# Patient Record
Sex: Female | Born: 1945 | Race: White | Hispanic: No | Smoking: Former smoker
Health system: Southern US, Community
[De-identification: ages and names within clinical notes are randomized; demographics above are authoritative.]

## PROBLEM LIST (undated history)

## (undated) DIAGNOSIS — E559 Vitamin D deficiency, unspecified: Secondary | ICD-10-CM

## (undated) HISTORY — PX: APPENDECTOMY: SHX54

## (undated) HISTORY — DX: Vitamin D deficiency, unspecified: E55.9

## (undated) HISTORY — PX: BACK SURGERY: SHX140

## (undated) HISTORY — PX: SALPINGOOPHORECTOMY: SHX82

---

## 1979-01-07 HISTORY — PX: ABDOMINAL HYSTERECTOMY: SHX81

## 1997-05-16 ENCOUNTER — Ambulatory Visit (HOSPITAL_COMMUNITY): Admission: RE | Admit: 1997-05-16 | Discharge: 1997-05-16 | Payer: Self-pay | Admitting: Internal Medicine

## 1999-12-13 ENCOUNTER — Encounter: Payer: Self-pay | Admitting: Internal Medicine

## 1999-12-13 ENCOUNTER — Encounter: Admission: RE | Admit: 1999-12-13 | Discharge: 1999-12-13 | Payer: Self-pay | Admitting: Internal Medicine

## 2002-08-28 ENCOUNTER — Encounter: Payer: Self-pay | Admitting: Emergency Medicine

## 2002-08-28 ENCOUNTER — Emergency Department (HOSPITAL_COMMUNITY): Admission: EM | Admit: 2002-08-28 | Discharge: 2002-08-29 | Payer: Self-pay | Admitting: Emergency Medicine

## 2004-08-16 ENCOUNTER — Ambulatory Visit (HOSPITAL_COMMUNITY): Admission: RE | Admit: 2004-08-16 | Discharge: 2004-08-16 | Payer: Self-pay | Admitting: Internal Medicine

## 2005-01-31 ENCOUNTER — Encounter: Admission: RE | Admit: 2005-01-31 | Discharge: 2005-01-31 | Payer: Self-pay | Admitting: Internal Medicine

## 2006-02-16 ENCOUNTER — Ambulatory Visit (HOSPITAL_COMMUNITY): Admission: RE | Admit: 2006-02-16 | Discharge: 2006-02-16 | Payer: Self-pay | Admitting: Internal Medicine

## 2006-02-26 ENCOUNTER — Ambulatory Visit: Payer: Self-pay

## 2006-09-24 ENCOUNTER — Encounter: Admission: RE | Admit: 2006-09-24 | Discharge: 2006-09-24 | Payer: Self-pay | Admitting: Internal Medicine

## 2007-11-02 ENCOUNTER — Encounter: Admission: RE | Admit: 2007-11-02 | Discharge: 2007-11-02 | Payer: Self-pay | Admitting: Orthopedic Surgery

## 2007-11-30 ENCOUNTER — Encounter: Admission: RE | Admit: 2007-11-30 | Discharge: 2007-11-30 | Payer: Self-pay | Admitting: Neurosurgery

## 2008-01-19 ENCOUNTER — Inpatient Hospital Stay (HOSPITAL_COMMUNITY): Admission: RE | Admit: 2008-01-19 | Discharge: 2008-01-25 | Payer: Self-pay | Admitting: Neurosurgery

## 2008-02-06 ENCOUNTER — Emergency Department (HOSPITAL_COMMUNITY): Admission: EM | Admit: 2008-02-06 | Discharge: 2008-02-06 | Payer: Self-pay | Admitting: Emergency Medicine

## 2008-06-22 ENCOUNTER — Ambulatory Visit: Payer: Self-pay | Admitting: Internal Medicine

## 2008-06-23 ENCOUNTER — Telehealth: Payer: Self-pay | Admitting: Internal Medicine

## 2008-07-06 ENCOUNTER — Ambulatory Visit: Payer: Self-pay | Admitting: Internal Medicine

## 2008-07-08 ENCOUNTER — Encounter: Payer: Self-pay | Admitting: Internal Medicine

## 2008-09-09 DIAGNOSIS — I10 Essential (primary) hypertension: Secondary | ICD-10-CM

## 2008-12-26 ENCOUNTER — Ambulatory Visit (HOSPITAL_COMMUNITY): Admission: RE | Admit: 2008-12-26 | Discharge: 2008-12-26 | Payer: Self-pay | Admitting: Internal Medicine

## 2009-02-07 ENCOUNTER — Ambulatory Visit (HOSPITAL_COMMUNITY): Admission: RE | Admit: 2009-02-07 | Discharge: 2009-02-07 | Payer: Self-pay | Admitting: Internal Medicine

## 2010-01-27 ENCOUNTER — Encounter: Payer: Self-pay | Admitting: Sports Medicine

## 2010-04-01 IMAGING — CR DG HIP COMPLETE 2+V*R*
3 series · 3 of 3 positions shown · non-contrast
Comparison: None

CLINICAL DATA: Abnormal bone density scan of the right hip.

RIGHT HIP - COMPLETE 2+ VIEW

[view not recorded (1 of 3)]
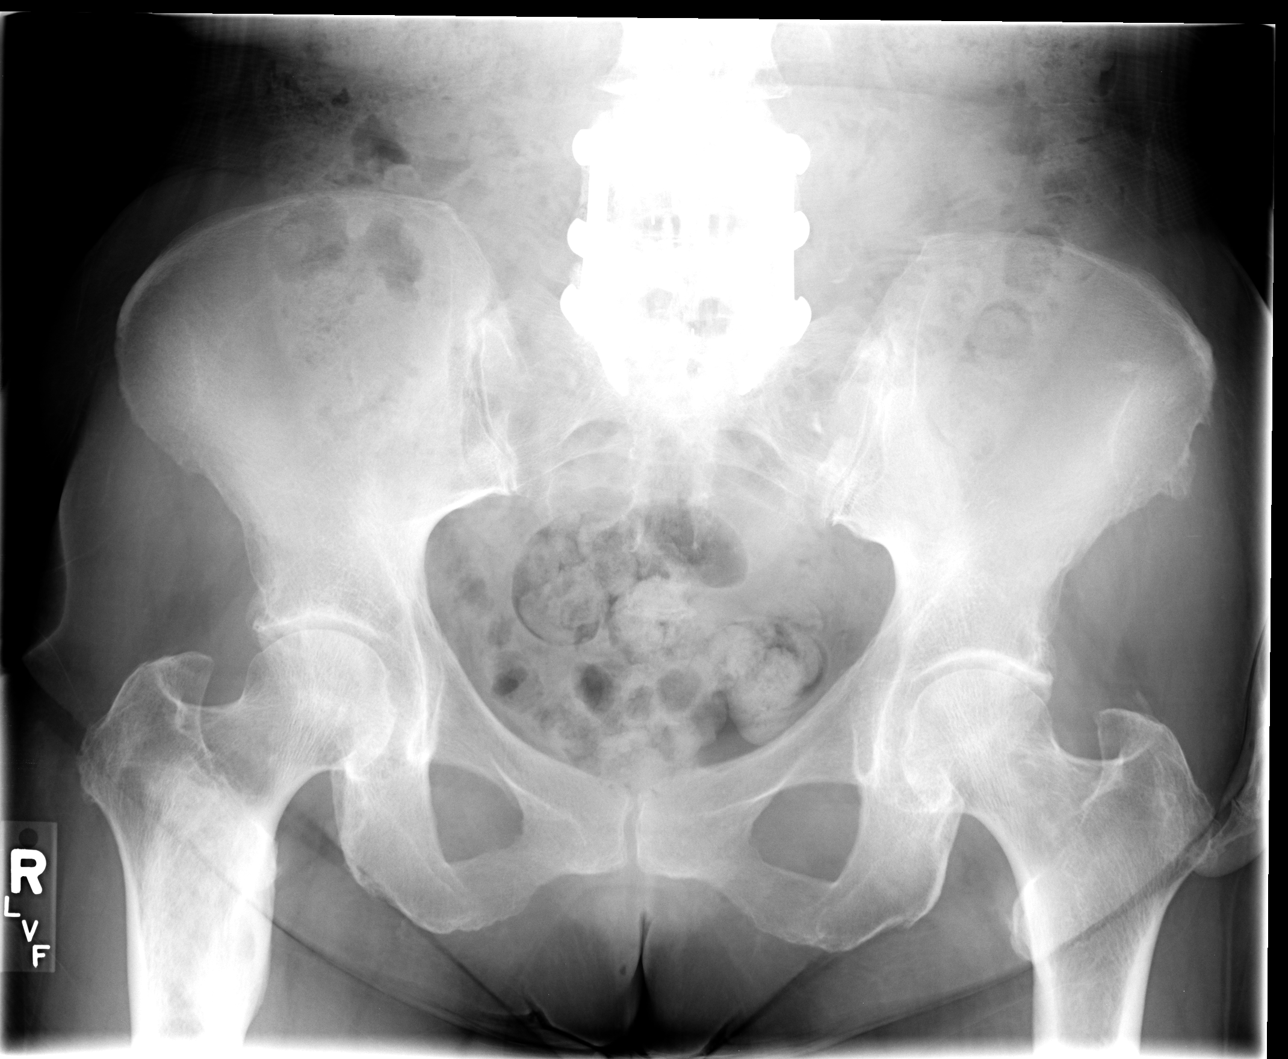

[view not recorded (2 of 3)]
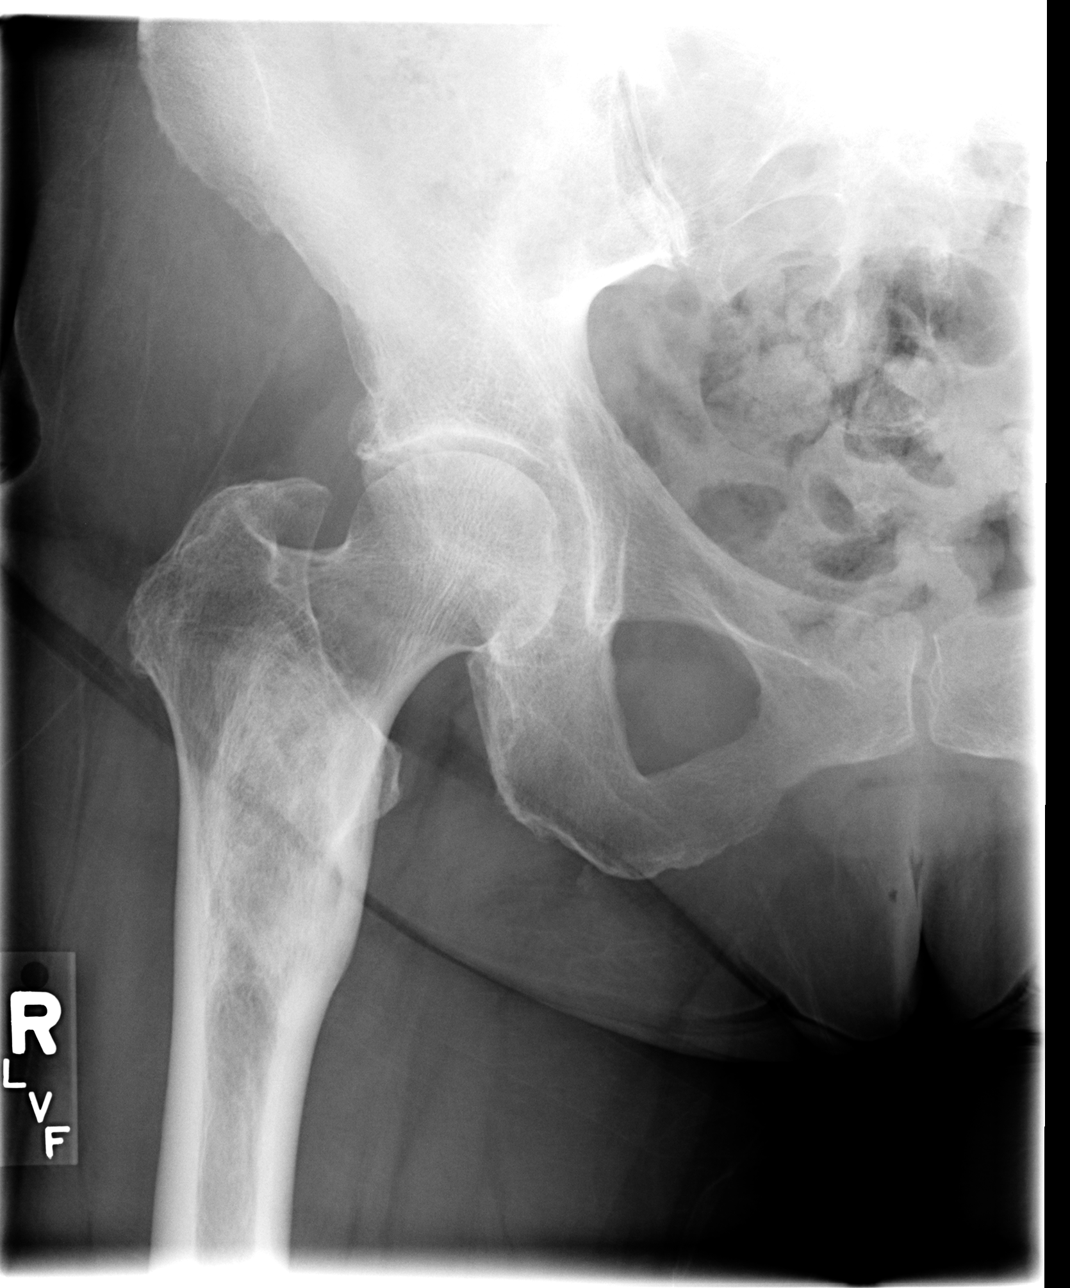

[view not recorded (3 of 3)]
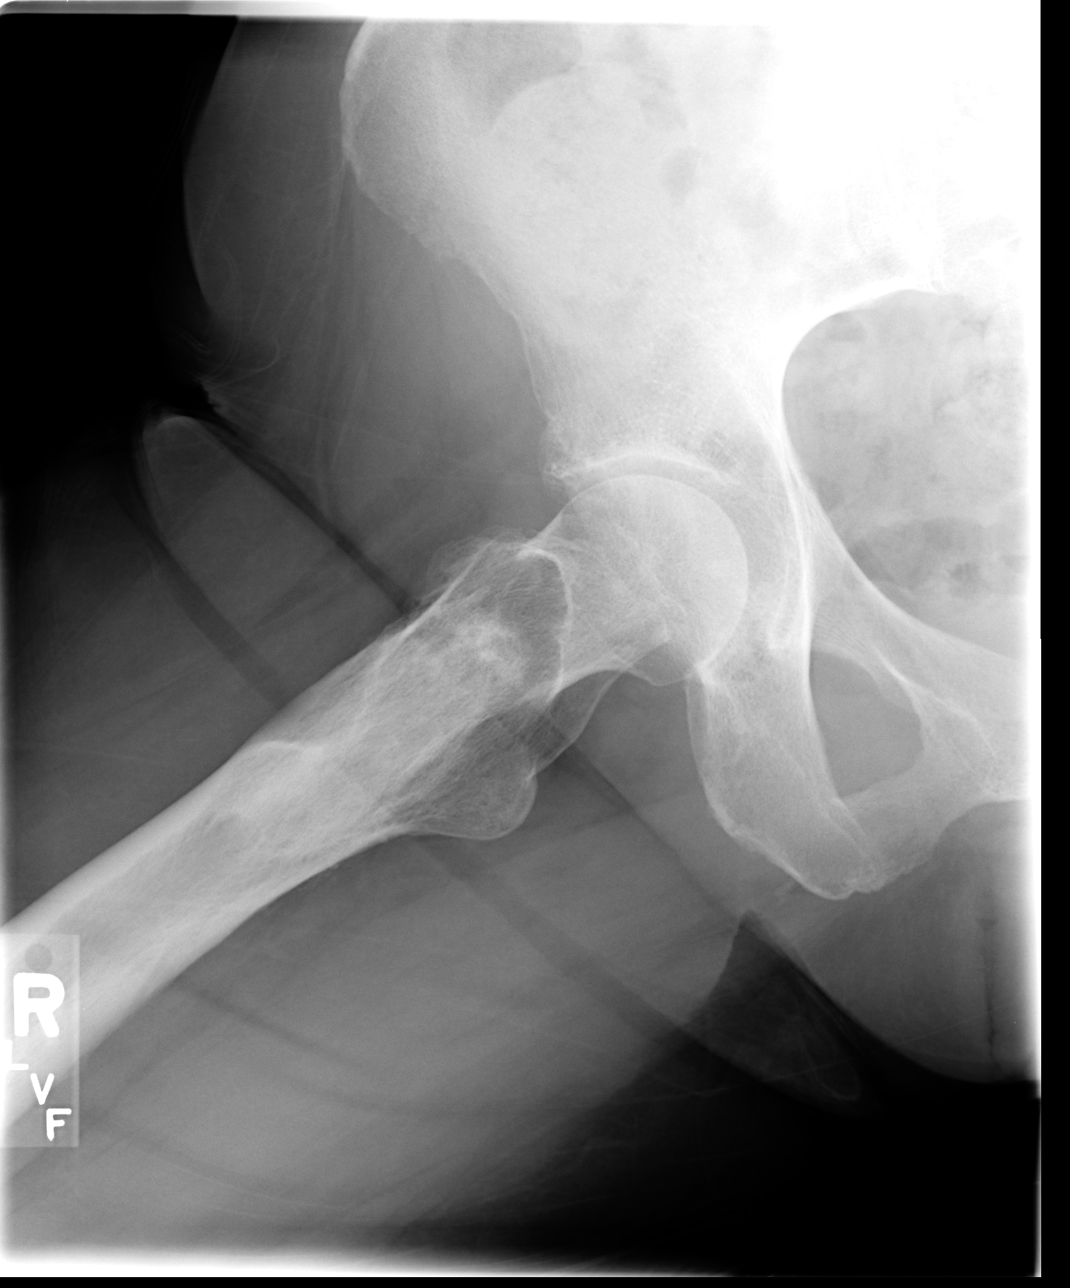

[3 of 3 positions shown; findings below may reference images not displayed]

FINDINGS: A sclerotic lesion of the right proximal femur extends
from the intertrochanteric region into the proximal diaphysis and
is associated with expansion of the medial cortex of the proximal
femur.  This lesion measures 9.8 x 4.4 x 4.0 cm and is
predominantly sclerotic with associated cortical thickening but a
large intramedullary component.  Zone of transition of this lesion
is indistinct along its distal border.
IMPRESSION: 1.  Sclerotic lesion of the proximal femur with medial cortical
expansion and significant intramedullary component.  Distal zone of
transition is indistinct.  Differential diagnostic considerations
may include Paget's disease, fibrous dysplasia, polymorphic fibro-
osseous tumor of bone, osteoblastoma, large enchondroma,
chondrosarcoma, or sclerotic metastatic disease.  Bone scan and MRI
with without contrast recommended for further characterization.

## 2010-04-22 LAB — BASIC METABOLIC PANEL
BUN: 10 mg/dL (ref 6–23)
CO2: 28 mEq/L (ref 19–32)
Calcium: 9.4 mg/dL (ref 8.4–10.5)
Chloride: 103 mEq/L (ref 96–112)
Creatinine, Ser: 0.67 mg/dL (ref 0.4–1.2)
GFR calc Af Amer: >60 mL/min (ref >60)
GFR calc non Af Amer: >60 mL/min (ref >60)
Glucose, Bld: 89 mg/dL (ref 70–99)
Potassium: 4.2 mEq/L (ref 3.5–5.1)
Sodium: 134 mEq/L — ABNORMAL LOW (ref 135–145)

## 2010-04-22 LAB — COMPREHENSIVE METABOLIC PANEL
ALT: 26 U/L (ref 0–35)
AST: 23 U/L (ref 0–37)
Albumin: 3.9 g/dL (ref 3.5–5.2)
Alkaline Phosphatase: 172 U/L — ABNORMAL HIGH (ref 39–117)
BUN: 14 mg/dL (ref 6–23)
Chloride: 97 mEq/L (ref 96–112)
Creatinine, Ser: 0.61 mg/dL (ref 0.4–1.2)
GFR calc non Af Amer: >60 mL/min (ref >60)
Glucose, Bld: 107 mg/dL — ABNORMAL HIGH (ref 70–99)
Sodium: 137 mEq/L (ref 135–145)

## 2010-04-22 LAB — CBC
HCT: 29.1 % — ABNORMAL LOW (ref 36.0–46.0)
HCT: 40.1 % (ref 36.0–46.0)
HCT: 35.3 % — ABNORMAL LOW (ref 36.0–46.0)
Hemoglobin: 13.5 g/dL (ref 12.0–15.0)
MCHC: 33.7 g/dL (ref 30.0–36.0)
MCHC: 34.2 g/dL (ref 30.0–36.0)
MCHC: 32.6 g/dL (ref 30.0–36.0)
MCV: 91.7 fL (ref 78.0–100.0)
MCV: 92.2 fL (ref 78.0–100.0)
MCV: 93 fL (ref 78.0–100.0)
Platelets: 281 10*3/uL (ref 150–400)
Platelets: 663 10*3/uL — ABNORMAL HIGH (ref 150–400)
RBC: 4.35 MIL/uL (ref 3.87–5.11)
RDW: 13 % (ref 11.5–15.5)
RDW: 13.1 % (ref 11.5–15.5)
WBC: 7.1 10*3/uL (ref 4.0–10.5)

## 2010-04-22 LAB — DIFFERENTIAL
Basophils Relative: 0 % (ref 0–1)
Eosinophils Relative: 4 % (ref 0–5)
Lymphocytes Relative: 18 % (ref 12–46)
Lymphs Abs: 1.6 10*3/uL (ref 0.7–4.0)
Monocytes Absolute: 0.8 10*3/uL (ref 0.1–1.0)
Monocytes Relative: 9 % (ref 3–12)
Neutrophils Relative %: 69 % (ref 43–77)

## 2010-04-22 LAB — ABO/RH: ABO/RH(D): A POS

## 2010-04-22 LAB — TYPE AND SCREEN
ABO/RH(D): A POS
Antibody Screen: NEGATIVE

## 2010-05-21 NOTE — Op Note (Signed)
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   PROCEDURE:  Right L5-S1 lumbar laminotomy, facetectomy, microdiskectomy  with microdissection, and right L5-S1 transverse posterior lumbar  interbody fusion with an AVS TPLIF PEEK interbody implant and mosaic  with bone marrow aspirate and a bilateral L4-5 lumbar decompression  including laminotomy, facetectomy, and foraminotomy with  microdissection, with decompression beyond that necessary for interbody  fusion with decompression of the L3 and L4 nerve roots bilaterally and  bilateral L4-5 posterior lumbar interbody fusion with AVS PEEK interbody  implants with mosaic with bone marrow aspirate and a L4-S1 posterior  arthrodesis with radius post instrumentation and mosaic with bone marrow  aspirate and infuse with microdissections.   SURGEON:  Hewitt Shorts, M.D.   ASSISTANT:  1. Nelia Shi. Webb Silversmith, NP  2. Coletta Memos, MD   ANESTHESIA:  General endotracheal.   INDICATIONS:  This is a 65 year old woman who had difficulty with right  lumbar radiculopathy.  She was found to grade 1 degenerative spinal  listhesis at L4-5 with multifactorial lumbar stenosis at that level as  well as a right L5-S1 lumbar disk  herniation.  The patient had been  treated with NSAIDs and has not had relief and because of persistent  pain and discomfort, limitation in her activities at work and at home.  Decision was made with proceed with decompression arthrodesis.   PROCEDURE:  The patient was brought to the operating room and placed  under general endotracheal anesthesia.  The patient was turned to prone  position.  Lumbar region is prepped with Betadine soap and solution and  draped in a sterile fashion.  The midline incision was infiltrated with  local anesthetic with epinephrine and x-ray was taken.  Midline incision  was made, carried through subcutaneous tissue.  Bipolar cautery and  electrocautery was used to maintain hemostasis.  Dissection was carried  down through lumbar fascia, which was incised bilaterally and the  paraspinal muscles were dissected from the spinous process and lamina in  a subperiosteal fashion.  The L4-5 and L5-S1 intralaminar space were  identified and an x-ray was taken to confirm the localization and then  to proceed with decompression at the L4-5 level bilaterally and in right-  side, L5-S1.  With magnification, using microdissection and  microsurgical technique we did proceed with bilateral L4-5 lumbar  laminotomy, facetectomy, and foraminotomy.  Using the X-Max drill and  Kerrison punches, the ligamentum  flavum was carefully removed.  We  identified the thecal sac and nerve roots and thickened ligament was  carefully removed decompressing the neural structures.  We then  proceeded with bilateral diskectomy at L4-5.  We incised the annulus  bilaterally and performed a diskectomy using a variety of microcurette  and pituitary rongeurs.  We then preparedthe endplates, removed the  cauterized endplate surface with paddle curettes and then used the sizer  to select a size for the interbody implants.   At L5-S1 on the right side a laminotomy and facetectomy was performed.  Using  the X-Max drill and Kerrison punches, the ligamentum flavum was  carefully removed.  The thecal sac and nerves roots were identified.  Thickened tissue was removed and then  carefully retracted thecal sac  and nerve root.  We proceed with diskectomy from the right side incising  the annulus and  continuing with variety of pituitary rongeurs and  microcurettes.  Through diskectomy was performed and then we prepared  the endplates using paddle curettes and measured the height of the  interbody space and selected a  9-mm implant.  The 11 mm implants were  selected for the L4-5 level.   The C-arm fluoroscope was then draped and brought in the field and we  identified the left L5 pedicle, which was probed into the vertebral body  and then we aspirated 15 mL of bone marrow aspirate and it was injected  over 15 mL strip of  mosaic.  We then packed the mosaic with bone marrow  aspirate into the interbody implants using 9 x 25 mm TPLIF implant at L5-  S1 with 4 degrees of lordosis and bilateral 11 x 25 mm implant at L4-5  with 4 degrees of lordosis.   We first the placed the implant at L5-S1 carefully retracting the thecal  sac and nerve root.  The implant was positioned in the intervertebral  disk space and maneuvered to the midline.  We then packed additional  mosaic with bone marrow aspirate in the interbody space.   At L4-5, we carefully retracted thecal sac and nerve root on the right  side and placed the interbody implant and then on the left side we  packed addition mosaic with bone marrow aspirate in the midline and then  subsequently placed a second implant on the left side again carefully  retracting the thecal sac and nerve roots.  Wound was irrigated numerous  times in the procedure with bacitracin solution and saline solution and  once the interbody implants and interbody arthrodesis was completed we  proceeded with the posterolateral arthrodesis.   With the C-arm fluoroscope, we  identified pedicle entry sites  bilaterally L4, and right side L5, and bilaterally at S1.  Each of the  pedicles was probed.  All 6 pedicles were examined with ball probe, good  bony surfaces were noted.  Each of the pedicles were tapped with 5.25 mm  tap and then we placed 5.75 mm screws at each level, placing 45 mm  screws at L4, 40-mm screws at L5, and 30-mm screws at S1.  Once all 6  screws were in place, we selected 60-mm length prelordosed rods, they  were positioned in the screw heads and secured with locking caps.  Once  all 6 locking caps were in place, final tightening was performed against  the counter torque.   We had previously decorticate the facets at L4-5 and L5-S1.  We packed,  infuse (we used a medium infuse)  with 4 pledgets placing one over each  facet complex and then packed the remaining mosaic with bone marrow  aspirate over the infuse.  We again examined the laminotomies and thecal  sac and nerve roots remained well decompressed.  None of the bone graft  material was within the spinal canal and then we proceeded with closure.  Deep fascia was closed with interrupted undyed 1 Vicryl sutures.  Scarpa  fascia was closed with interrupted undyed 1 Vicryl sutures.  The  subcutaneous and subcuticular were closed with interrupted inverted 2-0  undyed Vicryl sutures.  The skin edges was approximated with Dermabond.  Procedure tolerated well.  Estimated blood loss was 20 mL.  We did use  cell  saver during the procedure, but the technician felt that there was  insufficient blood loss to process the collected specimen.  Sponge and  needle count was correct.  Following surgery, the patient was turned  back to the supine position to be reversed from the anesthetic effect,  extubated and transferred to the recovery room for further care.      Hewitt Shorts, M.D.  Electronically Signed     RWN/MEDQ  D:  01/19/2008  T:  01/20/2008  Job:  [id number]

## 2010-05-21 NOTE — Discharge Summary (Signed)
[name], [name]             ACCOUNT NO.:  [id number]   MEDICAL RECORD NO.:  [id number]          PATIENT TYPE:  INP   LOCATION:  3005                         FACILITY:  [facility]   PHYSICIAN:  Hewitt Shorts, M.D.DATE OF BIRTH:  1945-04-17   DATE OF ADMISSION:  01/19/2008  DATE OF DISCHARGE:  01/25/2008                               DISCHARGE SUMMARY   ADMISSION HISTORY AND PHYSICAL EXAMINATION:  The patient is a 65-year-  old woman who has had difficulties with right lumbar radiculopathy.  She  had a grade 1 degenerative spondylolisthesis at L4-5 with multifactorial  lumbar stenosis at L4-5 and a right L5-S1 lumbar disk herniation.  She  had been treated with extensive nonsurgical management but because of  persistent pain and discomfort the patient was scheduled for admission  for decompression arthrodesis.  Further details of her admission history  and physical examination are included in her dictated admission noted.   HOSPITAL COURSE:  The patient was admitted underwent a L5-S1  microdiskectomy and TLIF of bilateral L4-5 lumbar decompression and PLIF  and an L4-S1 posterolateral arthrodesis with interbody implants,  posterior instrumentation, and bone graft.  Postoperatively, her  progress was slow.  She had some nausea and constipation, limited  walking.  We consulted Physical Therapy and Occupational Therapy who  continued to assist the patient through the hospitalization.  We did  make arrangements for the patient will be providing with a rolling  walker with 5 inch wheels; however, she was instructed to rapidly  progress to independent ambulation once home.  Her wound has healed  nicely.  She is afebrile.  She is having some discomfort she describes  to the lateral right hip then to the anterior right thigh, which  developed 3-4 days following surgery, and we will continue to monitor  that as an outpatient and hopefully that will work itself out.   She is advised to  use NSAID, and we suggested Advil or Aleve and  discussed dosing with the patient and her son and daughter.  She was  given prescription for hydrocodone 1 or 2 q.6 h. p.r.n. pain 50 tablets,  no refills.  She is to resume all of her usual home medications.   DISCHARGE DIAGNOSES:  1. Lumbar degenerative spondylolisthesis.  2. Lumbar stenosis.  3. Lumbar disk herniation.  4. Lumbar spondylosis.  5. Lumbar degenerative disk disease.  6. Lumbar radiculopathy.   DISCHARGE FOLLOWUP:  She is to return to the office in 3 weeks for  postoperative visit with AP and lateral lumbar spine x-rays and to let  us know if she needs anything sooner than that.      Hewitt Shorts, M.D.  Electronically Signed    RWN/MEDQ  D:  01/25/2008  T:  01/25/2008  Job:  [id number]

## 2010-05-21 NOTE — H&P (Signed)
[name], [name]             ACCOUNT NO.:  [id number]   MEDICAL RECORD NO.:  [id number]          PATIENT TYPE:  INP   LOCATION:  3005                         FACILITY:  [facility]   PHYSICIAN:  Hewitt Shorts, M.D.DATE OF BIRTH:  11-02-1945   DATE OF ADMISSION:  01/19/2008  DATE OF DISCHARGE:                              HISTORY & PHYSICAL   HISTORY OF PRESENT ILLNESS:  The patient is a 65 year old right-handed  white female who was evaluated for right lumbar radiculopathy, since  this began in October of 2009 with pain down over the sacrum, then  extended down to the right buttock, posterior thigh, and then into the  right calf and the ankle.  It has been quite uncomfortable.  She cannot  sit on the right side.  She can sit some on the left side actually and  tends to be more comfortable standing than sitting.  Work aggravates the  pain.  She has had numbness and tingling to the right buttock and into  the feet bilaterally, and some sense of weakness in the right lower  extremity, though she does not describe focal weakness.   The patient was evaluated with MRI and x-rays.  X-rays showed a static  grade I degenerative spondylolisthesis at L4-L5 with significant  bilateral L4-L5 facet arthropathy with disk space narrowing as well at  L5-S1 consistent with degenerative disk disease and spondylosis.  MRI  scan reconfirms the degenerative spondylolisthesis at L4-L5, moderate  multifactorial lumbar stenosis at L4-L5 and a right L5-S1 lumbar disk  herniation.   There was an area of abnormality noted at the right L4 pedicle, this was  evaluated and felt to represent a hemangioma.  The patient was treated  with Relafen.  She was also treated with a prednisone dose pack,  Flexeril, and hydrocodone, but despite all these measures, she is  continued to have pain and discomfort and is admitted now for surgical  intervention, specifically bilateral L4-L5 lumbar decompression and  right  L5-S1 lumbar decompression, L4-L5 and L5-S1 posterior lumbar  interbody fusion, and then bilateral L4-S1 posterolateral arthrodesis  with posterior instrumentation and bone graft.   PAST MEDICAL HISTORY:  Treated in the past for hypertension; however,  her primary care physician felt that her blood pressure was well  controlled and stopped medication earlier last year.  Remote history of  hiatal hernia and ulcer that has not required recent treatment.  No  history of myocardial infarction, cancer, stroke, diabetes, or lung  disease.   PAST SURGICAL HISTORY:  Previous surgery includes cesarean section in  1966 and 1970, hysterectomy in 1980s, and tonsillectomy.   ALLERGIES:  She denies allergy to medications.   CURRENT MEDICATIONS:  Aspirin 81 mg daily, levothyroxine, Xanax,  hydrocodone for pain, estradiol 0.5 mg daily, and a number of  supplements.   FAMILY HISTORY:  Parents have passed on.  There is a family history of  diabetes.   SOCIAL HISTORY:  The patient is a Pharmacologist at [employer].  She is  married.  She smokes quarter of pack a day.  She has smoked for nearly  50  years.  She does not drink alcohol.  She denies history of substance  abuse.   REVIEW OF SYSTEMS:  Notable for those described in the history of  present illness and past medical history, but is otherwise unremarkable.   PHYSICAL EXAMINATION:  GENERAL:  The patient is a well-developed and  well-nourished white female in no acute distress.  VITAL SIGNS:  Temperature is 97.9, pulse 76, blood pressure 112/79, and  respiratory rate 16, height 5 feet 1 inch, and weight 62 kg.  LUNGS:  Clear to auscultation.  She has symmetrical respiratory  excursion.  HEART:  Regular rate and rhythm.  No S1 and S2.  There is no murmur.  ABDOMEN:  Soft, nondistended, nontender.  Bowel sounds are present.  EXTREMITIES:  No clubbing, cyanosis, or edema.  MUSCULOSKELETAL:  Shows some tenderness down to the lumbosacral   junction.  Forward flexion is limited about 30 degrees due to  discomfort.  She is more comfortable in extension.  Straight leg raising  is negative on the left and positive on the right at about 70 degrees.  NEUROLOGIC:  Shows 5/5 strength in the lower extremities from the  iliopsoas, quadriceps, dorsiflexors, extensor hallucis longus, and  plantar flexion bilaterally.  Sensation is intact to pinprick to the  distal lower extremities.  Reflexes are 2 at the quadriceps.  Left  gastrocnemius is 1. The right gastrocnemius is 2.  Toes are downgoing  bilaterally.  Her gait and stance both favor the right lower extremity.   IMPRESSION:  The patient with right lumbar radicular pain, with intact  motor and sensory function.  A static grade I degenerative  spondylolisthesis at L4-L5 secondary to advanced facet arthropathy with  resulting moderate multifactorial lumbar stenosis as well as a right L5-  S1 lumbar disk herniation.   PLAN:  The patient readmitted for bilateral L4-L5 lumbar decompression,  right L5-S1 lumbar decompression with diskectomy, and L4-L5 and L5-S1  posterior lumbar interbody fusion with interbody implants and bone graft  and bilateral L4-S1 posterolateral arthrodesis with posterior  instrumentation and bone graft.   I have discussed the nature of the surgery, typical length of surgery,  hospital stay, and overall recuperation and limitations postoperatively,  need for postoperative immobilization in a lumbar brace and risks  including risks of infection, bleeding, possible need for transfusion,  risk of nerve dysfunction with pain, weakness, numbness, or  paresthesias, the risk of dural tear and CSF leakage, possible need of  further surgery, the risk of failure of the arthrodesis and possible  need of further surgery and anesthetic risk of myocardial infarction,  stroke, pneumonia, and death.  After discussing all this, she would go  ahead with surgery and is admitted  for such.      Hewitt Shorts, M.D.  Electronically Signed     RWN/MEDQ  D:  01/24/2008  T:  01/25/2008  Job:  [id number]

## 2010-12-25 ENCOUNTER — Other Ambulatory Visit (HOSPITAL_COMMUNITY): Payer: Self-pay | Admitting: Internal Medicine

## 2010-12-25 ENCOUNTER — Ambulatory Visit (HOSPITAL_COMMUNITY)
Admission: RE | Admit: 2010-12-25 | Discharge: 2010-12-25 | Disposition: A | Discharge status: Home / Self Care | Payer: BC Managed Care – PPO | Source: Ambulatory Visit | Attending: Internal Medicine | Admitting: Internal Medicine

## 2010-12-25 DIAGNOSIS — Z0000 Encounter for general adult medical examination without abnormal findings: Secondary | ICD-10-CM

## 2010-12-25 DIAGNOSIS — R05 Cough: Secondary | ICD-10-CM | POA: Insufficient documentation

## 2010-12-25 DIAGNOSIS — R0602 Shortness of breath: Secondary | ICD-10-CM | POA: Insufficient documentation

## 2010-12-25 DIAGNOSIS — R059 Cough, unspecified: Secondary | ICD-10-CM | POA: Insufficient documentation

## 2010-12-25 DIAGNOSIS — Z1231 Encounter for screening mammogram for malignant neoplasm of breast: Secondary | ICD-10-CM

## 2011-01-23 ENCOUNTER — Ambulatory Visit (HOSPITAL_COMMUNITY)
Admission: RE | Admit: 2011-01-23 | Discharge: 2011-01-23 | Disposition: A | Discharge status: Home / Self Care | Payer: Medicare Other | Source: Ambulatory Visit | Attending: Internal Medicine | Admitting: Internal Medicine

## 2011-01-23 DIAGNOSIS — Z1231 Encounter for screening mammogram for malignant neoplasm of breast: Secondary | ICD-10-CM

## 2012-05-06 ENCOUNTER — Ambulatory Visit (HOSPITAL_COMMUNITY)
Admission: RE | Admit: 2012-05-06 | Discharge: 2012-05-06 | Disposition: A | Discharge status: Home / Self Care | Payer: BC Managed Care – PPO | Source: Ambulatory Visit | Attending: Internal Medicine | Admitting: Internal Medicine

## 2012-05-06 ENCOUNTER — Other Ambulatory Visit (HOSPITAL_COMMUNITY): Payer: Self-pay | Admitting: Internal Medicine

## 2012-05-06 DIAGNOSIS — Z0000 Encounter for general adult medical examination without abnormal findings: Secondary | ICD-10-CM

## 2012-05-06 DIAGNOSIS — I10 Essential (primary) hypertension: Secondary | ICD-10-CM | POA: Insufficient documentation

## 2012-05-06 DIAGNOSIS — R071 Chest pain on breathing: Secondary | ICD-10-CM

## 2012-05-06 DIAGNOSIS — Z1231 Encounter for screening mammogram for malignant neoplasm of breast: Secondary | ICD-10-CM | POA: Insufficient documentation

## 2012-07-25 ENCOUNTER — Encounter (HOSPITAL_COMMUNITY): Payer: Self-pay | Admitting: Emergency Medicine

## 2012-07-25 ENCOUNTER — Emergency Department (INDEPENDENT_AMBULATORY_CARE_PROVIDER_SITE_OTHER)
Admission: EM | Admit: 2012-07-25 | Discharge: 2012-07-25 | Disposition: A | Discharge status: Home / Self Care | Payer: BC Managed Care – PPO | Source: Home / Self Care

## 2012-07-25 DIAGNOSIS — S39012A Strain of muscle, fascia and tendon of lower back, initial encounter: Secondary | ICD-10-CM

## 2012-07-25 DIAGNOSIS — M545 Low back pain: Secondary | ICD-10-CM

## 2012-07-25 MED ORDER — CYCLOBENZAPRINE HCL 5 MG PO TABS: 5.0000 mg | ORAL_TABLET | Freq: Three times a day (TID) | ORAL | Status: DC | PRN

## 2012-07-25 NOTE — ED Notes (Signed)
C/o lower back pain since Thursday. Pt denies urinary symptoms and injury. Pt has used heating pad and ice paks with mild relief. Hx of back surgery.  Pt has used tylenol for pain

## 2012-07-25 NOTE — ED Provider Notes (Signed)
History    CSN: [id number] Arrival date & time 07/25/12  1522  First MD Initiated Contact with Patient 07/25/12 1543     Chief Complaint  Patient presents with  . Back Pain    since thursday.   (Consider location/radiation/quality/duration/timing/severity/associated sxs/prior Treatment) HPI Comments: 67 year old female presents with low back pain for approximately 3 days. 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 The pain started early in the morning and worsened over the past couple days. She denies radicular pain, radiation of pain down the legs, focal weakness or paresthesias. It is noted that she had back surgery in 2010 4 discogenic pain. Since that time she has not been having back pain.  History reviewed. No pertinent past medical history. Past Surgical History  Procedure Laterality Date  . Back surgery     History reviewed. No pertinent family history. History  Substance Use Topics  . Smoking status: Never Smoker   . Smokeless tobacco: Not on file  . Alcohol Use: No   OB History   Grav Para Term Preterm Abortions TAB SAB Ect Mult Living                 Review of Systems  Constitutional: Positive for activity change. Negative for fever and fatigue.  HENT: Negative.   Respiratory: Negative.   Cardiovascular: Negative.   Genitourinary: Negative.   Musculoskeletal: Positive for back pain.  Skin: Negative.   Neurological: Negative for dizziness, tremors, syncope and headaches.    Allergies  Aspirin; Morphine and related; Sulfa antibiotics; and Vicodin  Home Medications   Current Outpatient Rx  Name  Route  Sig  Dispense  Refill  . ALPRAZolam (XANAX) 1 MG tablet   Oral   Take 1 mg by mouth at bedtime as needed for sleep.         . bumetanide (BUMEX) 2 MG tablet   Oral   Take 2 mg by mouth daily.         [name] LEVOTHYROXINE SODIUM PO   Oral   Take by mouth.         . cyclobenzaprine (FLEXERIL) 5 MG  tablet   Oral   Take 1 tablet (5 mg total) by mouth 3 (three) times daily as needed for muscle spasms.   20 tablet   0    BP 128/83  Pulse 70  Temp(Src) 97.9 F (36.6 C) (Oral)  Resp 18  SpO2 100% Physical Exam  Nursing note and vitals reviewed. Constitutional: She is oriented to person, place, and time. She appears well-developed and well-nourished. No distress.  Neck: Normal range of motion. Neck supple.  Cardiovascular: Normal rate.   Pulmonary/Chest: Effort normal.  Musculoskeletal: She exhibits tenderness. She exhibits no edema.  Limited spinal ROM with forward flexion. Tenderness along the lower bilateral paralumbar/sacral musculature. No spinal tenderness.  Neurological: She is alert and oriented to person, place, and time. She exhibits normal muscle tone.  Skin: Skin is warm and dry.  Psychiatric: She has a normal mood and affect.    ED Course  Procedures (including critical care time) Labs Reviewed - No data to display No results found. 1. Low back pain   2. Lumbosacral strain, initial encounter     MDM  Patient has multiple allergies to include NSAIDs and opiates. We will treat with Flexeril 5 mg up to 3 times a day when necessary muscle discomfort, apply heat to the area pain may also try ice packs seems to  help Gradual stretches is demonstrated. Limit or avoid reaching, leaning forward, stooping over and bending. Followup with her primary care doctor as needed. There is no radicular type pain and it is doubtful that this pain has to do with spinal origin.  Hayden Rasmussen, NP 07/25/12 1619  Hayden Rasmussen, NP 07/25/12 1623

## 2012-07-26 NOTE — ED Provider Notes (Signed)
Medical screening examination/treatment/procedure(s) were performed by a resident physician or non-physician practitioner and as the supervising physician I was immediately available for consultation/collaboration.  Clementeen Graham, MD   Rodolph Bong, MD 07/26/12 [phone]

## 2012-09-08 ENCOUNTER — Other Ambulatory Visit: Payer: Self-pay | Admitting: Internal Medicine

## 2012-09-08 DIAGNOSIS — R102 Pelvic and perineal pain: Secondary | ICD-10-CM

## 2012-09-08 DIAGNOSIS — R1900 Intra-abdominal and pelvic swelling, mass and lump, unspecified site: Secondary | ICD-10-CM

## 2012-09-10 ENCOUNTER — Ambulatory Visit
Admission: RE | Admit: 2012-09-10 | Discharge: 2012-09-10 | Disposition: A | Discharge status: Home / Self Care | Payer: Medicare Other | Source: Ambulatory Visit | Attending: Internal Medicine | Admitting: Internal Medicine

## 2012-09-10 ENCOUNTER — Ambulatory Visit
Admission: RE | Admit: 2012-09-10 | Discharge: 2012-09-10 | Disposition: A | Discharge status: Home / Self Care | Payer: BC Managed Care – PPO | Source: Ambulatory Visit | Attending: Internal Medicine | Admitting: Internal Medicine

## 2012-09-10 DIAGNOSIS — R102 Pelvic and perineal pain: Secondary | ICD-10-CM

## 2012-09-10 DIAGNOSIS — R1900 Intra-abdominal and pelvic swelling, mass and lump, unspecified site: Secondary | ICD-10-CM

## 2012-11-15 ENCOUNTER — Ambulatory Visit: Payer: Self-pay | Admitting: Physician Assistant

## 2012-12-11 ENCOUNTER — Encounter: Payer: Self-pay | Admitting: Internal Medicine

## 2012-12-11 DIAGNOSIS — E785 Hyperlipidemia, unspecified: Secondary | ICD-10-CM | POA: Insufficient documentation

## 2012-12-11 DIAGNOSIS — K2100 Gastro-esophageal reflux disease with esophagitis, without bleeding: Secondary | ICD-10-CM | POA: Insufficient documentation

## 2012-12-11 DIAGNOSIS — E782 Mixed hyperlipidemia: Secondary | ICD-10-CM | POA: Insufficient documentation

## 2012-12-11 DIAGNOSIS — F325 Major depressive disorder, single episode, in full remission: Secondary | ICD-10-CM | POA: Insufficient documentation

## 2012-12-13 ENCOUNTER — Ambulatory Visit (INDEPENDENT_AMBULATORY_CARE_PROVIDER_SITE_OTHER): Payer: Medicare Other | Admitting: Physician Assistant

## 2012-12-13 ENCOUNTER — Encounter: Payer: Self-pay | Admitting: Physician Assistant

## 2012-12-13 VITALS — BP 100/62 | HR 76 | Temp 97.5°F | Resp 16 | Ht 62.0 in | Wt 146.0 lb

## 2012-12-13 DIAGNOSIS — E559 Vitamin D deficiency, unspecified: Secondary | ICD-10-CM

## 2012-12-13 DIAGNOSIS — I10 Essential (primary) hypertension: Secondary | ICD-10-CM

## 2012-12-13 DIAGNOSIS — E785 Hyperlipidemia, unspecified: Secondary | ICD-10-CM

## 2012-12-13 LAB — CBC WITH DIFFERENTIAL/PLATELET
Basophils Relative: 1 % (ref 0–1)
Eosinophils Absolute: 0.1 10*3/uL (ref 0.0–0.7)
HCT: 37.8 % (ref 36.0–46.0)
Hemoglobin: 13.1 g/dL (ref 12.0–15.0)
Lymphocytes Relative: 21 % (ref 12–46)
Lymphs Abs: 1.5 10*3/uL (ref 0.7–4.0)
MCH: 29.6 pg (ref 26.0–34.0)
MCV: 85.5 fL (ref 78.0–100.0)
Neutro Abs: 4.8 10*3/uL (ref 1.7–7.7)
Neutrophils Relative %: 68 % (ref 43–77)
Platelets: 361 10*3/uL (ref 150–400)

## 2012-12-13 NOTE — Patient Instructions (Signed)
Add benefiber 1-2 TBSP every day or the chewable.  Cholesterol Cholesterol is a white, waxy, fat-like protein needed by your body in small amounts. The liver makes all the cholesterol you need. It is carried from the liver by the blood through the blood vessels. Deposits (plaque) may build up on blood vessel walls. This makes the arteries narrower and stiffer. Plaque increases the risk for heart attack and stroke. You cannot feel your cholesterol level even if it is very high. The only way to know is by a blood test to check your lipid (fats) levels. Once you know your cholesterol levels, you should keep a record of the test results. Work with your caregiver to to keep your levels in the desired range. WHAT THE RESULTS MEAN:  Total cholesterol is a rough measure of all the cholesterol in your blood.  LDL is the so-called bad cholesterol. This is the type that deposits cholesterol in the walls of the arteries. You want this level to be low.  HDL is the good cholesterol because it cleans the arteries and carries the LDL away. You want this level to be high.  Triglycerides are fat that the body can either burn for energy or store. High levels are closely linked to heart disease. DESIRED LEVELS:  Total cholesterol below 200.  LDL below 100 for people at risk, below 70 for very high risk.  HDL above 50 is good, above 60 is best.  Triglycerides below 150. HOW TO LOWER YOUR CHOLESTEROL:  Diet.  Choose fish or white meat chicken and [place], roasted or baked. Limit fatty cuts of red meat, fried foods, and processed meats, such as sausage and lunch meat.  Eat lots of fresh fruits and vegetables. Choose whole grains, beans, pasta, potatoes and cereals.  Use only small amounts of olive, corn or canola oils. Avoid butter, mayonnaise, shortening or palm kernel oils. Avoid foods with trans-fats.  Use skim/nonfat milk and low-fat/nonfat yogurt and cheeses. Avoid whole milk, cream, ice cream, egg  yolks and cheeses. Healthy desserts include angel food cake, ginger snaps, animal crackers, hard candy, popsicles, and low-fat/nonfat frozen yogurt. Avoid pastries, cakes, pies and cookies.  Exercise.  A regular program helps decrease LDL and raises HDL.  Helps with weight control.  Do things that increase your activity level like gardening, walking, or taking the stairs.  Medication.  May be prescribed by your caregiver to help lowering cholesterol and the risk for heart disease.  You may need medicine even if your levels are normal if you have several risk factors. HOME CARE INSTRUCTIONS   Follow your diet and exercise programs as suggested by your caregiver.  Take medications as directed.  Have blood work done when your caregiver feels it is necessary. MAKE SURE YOU:   Understand these instructions.  Will watch your condition.  Will get help right away if you are not doing well or get worse. Document Released: 09/17/2000 Document Revised: 03/17/2011 Document Reviewed: 03/10/2007 [facility] Patient Information 2014 [place], [place].

## 2012-12-13 NOTE — Progress Notes (Signed)
HPI Patient presents for 3 month follow up with hypertension, hyperlipidemia, prediabetes and vitamin D. Patient's blood pressure has been controlled at home. Patient denies chest pain, shortness of breath, dizziness.  Patient's cholesterol is diet controlled.The cholesterol last visit was LDL 135. Hypothyroid- she is on her medications and denies symptoms. Patient is on Vitamin D supplement and it was 99 last visit.  Current Medications:  Current Outpatient Prescriptions on File Prior to Visit  Medication Sig Dispense Refill  . ALPRAZolam (XANAX) 1 MG tablet Take 1 mg by mouth 3 (three) times daily as needed for sleep.       . Ascorbic Acid (VITAMIN C PO) Take 1,000 mg by mouth daily.      [name] aspirin 81 MG chewable tablet Chew by mouth daily.      . bumetanide (BUMEX) 2 MG tablet Take 2 mg by mouth daily.      . Cholecalciferol (VITAMIN D PO) Take 4,000 Int'l Units by mouth daily.      . Ginkgo Biloba (GINKOBA PO) Take by mouth daily.      [name] LEVOTHYROXINE SODIUM PO Take 75 mcg by mouth every other day.       . ranitidine (ZANTAC) 300 MG capsule Take 300 mg by mouth as needed for heartburn.       No current facility-administered medications on file prior to visit.   Medical History:  Past Medical History  Diagnosis Date  . Hypertension   . GERD (gastroesophageal reflux disease)   . Hyperlipidemia   . Vitamin D deficiency   . Depression    Allergies:  Allergies  Allergen Reactions  . Aspirin     REACTION: GI ulcers  . Morphine And Related   . Sulfa Antibiotics   . Vicodin [Hydrocodone-Acetaminophen]     ROS Constitutional: Denies fever, chills, weight loss/gain, headaches, insomnia, fatigue, night sweats, and change in appetite. Eyes: Denies redness, blurred vision, diplopia, discharge, itchy, watery eyes.  ENT: Denies discharge, congestion, post nasal drip, sore throat, earache, dental pain, Tinnitus, Vertigo, Sinus pain, snoring.  Cardio: Denies chest pain, palpitations,  irregular heartbeat,  dyspnea, diaphoresis, orthopnea, PND, claudication, edema Respiratory: denies cough, dyspnea,pleurisy, hoarseness, wheezing.  Gastrointestinal: Denies dysphagia, heartburn,  water brash, pain, cramps, nausea, vomiting, bloating, diarrhea, constipation, hematemesis, melena, hematochezia,  hemorrhoids Genitourinary: Denies dysuria, frequency, urgency, nocturia, hesitancy, discharge, hematuria, flank pain Musculoskeletal: Denies arthralgia, myalgia, stiffness, Jt. Swelling, pain, limp, and strain/sprain. Skin: Denies pruritis, rash, hives, warts, acne, eczema, changing in skin lesion Neuro: Denies Weakness, tremor, incoordination, spasms, paresthesia, pain Psychiatric: Denies confusion, memory loss, sensory loss Endocrine: Denies change in weight, skin, hair change, nocturia, and paresthesia, Diabetic Polys, Denies visual blurring, hyper /hypo glycemic episodes.  Heme/Lymph: Denies Excessive bleeding, bruising, enlarged lymph nodes  Family history- Review and unchanged Social history- Review and unchanged Physical Exam: Filed Vitals:   12/13/12 1454  BP: 100/62  Pulse: 76  Temp: 97.5 F (36.4 C)  Resp: 16   Filed Weights   12/13/12 1454  Weight: 146 lb (66.225 kg)   General Appearance: Well nourished, in no apparent distress. Eyes: PERRLA, EOMs, conjunctiva no swelling or erythema, normal fundi and vessels. Sinuses: No Frontal/maxillary tenderness ENT/Mouth: Ext aud canals clear, with TMs without erythema, bulging.No erythema, swelling, or exudate on post pharynx.  Tonsils not swollen or erythematous. Hearing normal.  Neck: Supple, thyroid normal.  Respiratory: Respiratory effort normal, BS equal bilaterally without rales, rhonchi, wheezing or stridor.  Cardio: Heart sounds normal, regular rate and rhythm without  murmurs, rubs or gallops. Peripheral pulses brisk and equal bilaterally, without edema.  Abdomen: Flat, soft, with bowel sounds. Non tender, no guarding,  rebound, hernias, masses, or organomegaly.  Lymphatics: Non tender without lymphadenopathy.  Musculoskeletal: Full ROM all peripheral extremities, joint stability, 5/5 strength, and normal gait. Skin: Warm, dry without rashes, lesions, ecchymosis.  Neuro: Cranial nerves intact, reflexes equal bilaterally. Normal muscle tone, no cerebellar symptoms. Sensation intact.  Psych: Awake and oriented X 3, normal affect, Insight and Judgment appropriate.   Assessment and Plan:  Hypertension: Continue medication, monitor blood pressure at home. Continue DASH diet. Cholesterol: Continue diet and exercise. Check cholesterol.  Hypothyroid- check TSH and continue meds Vitamin D Def- check level and continue medications.   Quentin Mulling 3:04 PM

## 2012-12-14 LAB — HEPATIC FUNCTION PANEL
AST: 23 U/L (ref 0–37)
Bilirubin, Direct: 0.1 mg/dL (ref 0.0–0.3)
Total Bilirubin: 0.5 mg/dL (ref 0.3–1.2)

## 2012-12-14 LAB — BASIC METABOLIC PANEL WITH GFR
CO2: 31 mEq/L (ref 19–32)
Calcium: 10.1 mg/dL (ref 8.4–10.5)
Chloride: 99 mEq/L (ref 96–112)
Sodium: 143 mEq/L (ref 135–145)

## 2012-12-14 LAB — LIPID PANEL
Cholesterol: 243 mg/dL — ABNORMAL HIGH (ref 0–200)
Total CHOL/HDL Ratio: 4.7 Ratio
VLDL: 34 mg/dL (ref 0–40)

## 2013-01-26 ENCOUNTER — Encounter: Payer: Self-pay | Admitting: Internal Medicine

## 2013-01-26 ENCOUNTER — Ambulatory Visit: Payer: Medicare Other | Admitting: Internal Medicine

## 2013-01-26 VITALS — BP 108/70 | HR 60 | Temp 98.1°F | Resp 18 | Ht 62.0 in | Wt 148.8 lb

## 2013-01-26 DIAGNOSIS — E559 Vitamin D deficiency, unspecified: Secondary | ICD-10-CM

## 2013-01-26 DIAGNOSIS — Z1212 Encounter for screening for malignant neoplasm of rectum: Secondary | ICD-10-CM

## 2013-01-26 DIAGNOSIS — G4700 Insomnia, unspecified: Secondary | ICD-10-CM

## 2013-01-26 DIAGNOSIS — R7309 Other abnormal glucose: Secondary | ICD-10-CM | POA: Insufficient documentation

## 2013-01-26 DIAGNOSIS — Z0000 Encounter for general adult medical examination without abnormal findings: Secondary | ICD-10-CM

## 2013-01-26 DIAGNOSIS — E782 Mixed hyperlipidemia: Secondary | ICD-10-CM

## 2013-01-26 DIAGNOSIS — Z79899 Other long term (current) drug therapy: Secondary | ICD-10-CM

## 2013-01-26 DIAGNOSIS — I10 Essential (primary) hypertension: Secondary | ICD-10-CM

## 2013-01-26 LAB — CBC WITH DIFFERENTIAL/PLATELET
BASOS ABS: 0 10*3/uL (ref 0.0–0.1)
Basophils Relative: 1 % (ref 0–1)
EOS ABS: 0.2 10*3/uL (ref 0.0–0.7)
EOS PCT: 2 % (ref 0–5)
HEMATOCRIT: 37.5 % (ref 36.0–46.0)
Hemoglobin: 12.7 g/dL (ref 12.0–15.0)
LYMPHS PCT: 22 % (ref 12–46)
Lymphs Abs: 1.4 10*3/uL (ref 0.7–4.0)
MCH: 29.8 pg (ref 26.0–34.0)
MCHC: 33.9 g/dL (ref 30.0–36.0)
MCV: 88 fL (ref 78.0–100.0)
MONO ABS: 0.5 10*3/uL (ref 0.1–1.0)
Monocytes Relative: 8 % (ref 3–12)
Neutro Abs: 4.3 10*3/uL (ref 1.7–7.7)
Neutrophils Relative %: 67 % (ref 43–77)
Platelets: 324 10*3/uL (ref 150–400)
RBC: 4.26 MIL/uL (ref 3.87–5.11)
RDW: 13.9 % (ref 11.5–15.5)
WBC: 6.5 10*3/uL (ref 4.0–10.5)

## 2013-01-26 MED ORDER — LORAZEPAM 2 MG PO TABS: ORAL_TABLET | ORAL | Status: DC

## 2013-01-26 MED ORDER — LORAZEPAM 2 MG PO TABS: 2.0000 mg | ORAL_TABLET | Freq: Four times a day (QID) | ORAL | Status: DC | PRN

## 2013-01-26 NOTE — Progress Notes (Signed)
Patient ID: [name], female   DOB: 06/30/1945, 68 y.o.   MRN: [id number]  Annual Screening Comprehensive Examination  This very nice 68 y.o. MWF presents for complete physical.  Patient has been followed for HTN,  Prediabetes, Hyperlipidemia, and Vitamin D Deficiency.    HTN predates since 1995. Patient's BP has been controlled at home. Today's BP: 108/70 mmHg. Patient denies any cardiac symptoms as chest pain, palpitations, shortness of breath, dizziness or ankle swelling.   Patient's hyperlipidemia is not controlled with diet and benefiber. Last cholesterol last visit was 217, triglycerides 150, HDL 52 and LDL 135 - not at goal (History of intolerance to statins and Zetia).     Patient has prediabetes A1c 5.7% predating since Nov 2011 with last A1c 5.2% in Aug 2014. Patient denies reactive hypoglycemic symptoms, visual blurring, diabetic polys, or paresthesias.    Finally, patient has history of Vitamin D Deficiency 40 in 2008  (on supplements) andwith last vitamin D 79 in Aug 2014.       Medication List         aspirin 81 MG tablet  Take 81 mg by mouth daily.     BENEFIBER PO  Take by mouth. Takes 2 tabs per day     bumetanide 2 MG tablet  Commonly known as:  BUMEX  Take 2 mg by mouth daily.     GINKGO BILOBA PLUS PO  Take 60 mg by mouth daily.     LEVOTHYROXINE SODIUM PO  Take 75 mcg by mouth every other day. Alternates 1 tab and 1/2 tab every other day     LORazepam 2 MG tablet  Commonly known as:  ATIVAN  1/2 to 1 tablet at bedtime as needed for sleep     ranitidine 300 MG capsule  Commonly known as:  ZANTAC  Take 300 mg by mouth as needed for heartburn.     vitamin C 500 MG tablet  Commonly known as:  ASCORBIC ACID  Take 500 mg by mouth daily.     VITAMIN D PO  Take 2,000 Int'l Units by mouth 2 (two) times daily.        Allergies  Allergen Reactions  . Aspirin     REACTION: GI ulcers  . Morphine And Related   . Sulfa Antibiotics   . Vicodin  [Hydrocodone-Acetaminophen]     Past Medical History  Diagnosis Date  . Hypertension   . GERD (gastroesophageal reflux disease)   . Hyperlipidemia   . Vitamin D deficiency   . Depression     Past Surgical History  Procedure Laterality Date  . Back surgery    . Appendectomy    . Cesarean section    . Abdominal hysterectomy  1981    partial  . Salpingoophorectomy  1981 left    1982 right    Family History  Problem Relation Age of Onset  . Diabetes Mother   . Kidney disease Mother   . Heart disease Mother   . Hypertension Father   . Heart disease Father   . Heart disease Sister     History  Substance Use Topics  . Smoking status: Former Research scientist (life sciences)  . Smokeless tobacco: Not on file  . Alcohol Use: No    ROS Constitutional: Denies fever, chills, weight loss/gain, headaches, insomnia, fatigue, night sweats, and change in appetite. Eyes: Denies redness, blurred vision, diplopia, discharge, itchy, watery eyes.  ENT: Denies discharge, congestion, post nasal drip, epistaxis, sore throat, earache, hearing loss, dental pain, Tinnitus,  Vertigo, Sinus pain, snoring.  Cardio: Denies chest pain, palpitations, irregular heartbeat, syncope, dyspnea, diaphoresis, orthopnea, PND, claudication, edema Respiratory: denies cough, dyspnea, DOE, pleurisy, hoarseness, laryngitis, wheezing.  Gastrointestinal: Denies dysphagia, heartburn, reflux, water brash, pain, cramps, nausea, vomiting, bloating, diarrhea, constipation, hematemesis, melena, hematochezia, jaundice, hemorrhoids Genitourinary: Denies dysuria, frequency, urgency, nocturia, hesitancy, discharge, hematuria, flank pain Breast:Breast lumps, nipple discharge, bleeding.  Musculoskeletal: Denies arthralgia, myalgia, stiffness, Jt. Swelling, pain, limp, and strain/sprain. Skin: Denies puritis, rash, hives, warts, acne, eczema, changing in skin lesion Neuro: No weakness, tremor, incoordination, spasms, paresthesia, pain Psychiatric: Denies  confusion, memory loss, sensory loss Endocrine: Denies change in weight, skin, hair change, nocturia, and paresthesia, diabetic polys, visual blurring, hyper / hypo glycemic episodes.  Heme/Lymph: No excessive bleeding, bruising, enlarged lymph nodes.  BP: 108/70  Pulse: 60  Temp: 98.1 F (36.7 C)  Resp: 18    Estimated body mass index is 27.21 kg/(m^2) as calculated from the following:   Height as of this encounter: 5\' 2"  (1.575 m).   Weight as of this encounter: 148 lb 12.8 oz (67.495 kg).  Physical Exam General Appearance: Well nourished, in no apparent distress. Eyes: PERRLA, EOMs, conjunctiva no swelling or erythema, normal fundi and vessels. Sinuses: No frontal/maxillary tenderness ENT/Mouth: EACs patent / TMs  nl. Nares clear without erythema, swelling, mucoid exudates. Oral hygiene is good. No erythema, swelling, or exudate. Tongue normal, non-obstructing. Tonsils not swollen or erythematous. Hearing normal.  Neck: Supple, thyroid normal. No bruits, nodes or JVD. Respiratory: Respiratory effort normal.  BS equal and clear bilateral without rales, rhonci, wheezing or stridor. Cardio: Heart sounds are normal with regular rate and rhythm and no murmurs, rubs or gallops. Peripheral pulses are normal and equal bilaterally without edema. No aortic or femoral bruits. Chest: symmetric with normal excursions and percussion. Breasts: Symmetric, without lumps, nipple discharge, retractions, or fibrocystic changes.  Abdomen: Flat, soft, with bowl sounds. Nontender, no guarding, rebound, hernias, masses, or organomegaly.  Lymphatics: Non tender without lymphadenopathy.  Genitourinary:  Musculoskeletal: Full ROM all peripheral extremities, joint stability, 5/5 strength, and normal gait. Skin: Warm and dry without rashes, lesions, cyanosis, clubbing or  ecchymosis.  Neuro: Cranial nerves intact, reflexes equal bilaterally. Normal muscle tone, no cerebellar symptoms. Sensation intact.  Pysch:  Awake and oriented X 3, normal affect, Insight and Judgment appropriate.   Assessment and Plan  1. Annual Screening Examination 2. Hypertension  3. Hyperlipidemia 4. Pre Diabetes 5. Vitamin D Deficiency  Continue prudent diet as discussed, weight control, BP monitoring, regular exercise, and medications. Discussed med's effects and SE's. Screening labs and tests as requested with regular follow-up as recommended.

## 2013-01-26 NOTE — Patient Instructions (Signed)

## 2013-01-27 LAB — BASIC METABOLIC PANEL WITH GFR
BUN: 17 mg/dL (ref 6–23)
CHLORIDE: 101 meq/L (ref 96–112)
CO2: 30 meq/L (ref 19–32)
CREATININE: 0.61 mg/dL (ref 0.50–1.10)
Calcium: 9.3 mg/dL (ref 8.4–10.5)
GFR, Est African American: >89 mL/min
GFR, Est Non African American: >89 mL/min
GLUCOSE: 90 mg/dL (ref 70–99)
Potassium: 3.7 mEq/L (ref 3.5–5.3)
Sodium: 140 mEq/L (ref 135–145)

## 2013-01-27 LAB — LIPID PANEL
CHOLESTEROL: 218 mg/dL — AB (ref 0–200)
HDL: 53 mg/dL (ref >39)
LDL Cholesterol: 141 mg/dL — ABNORMAL HIGH (ref 0–99)
Total CHOL/HDL Ratio: 4.1 Ratio
Triglycerides: 119 mg/dL (ref <150)
VLDL: 24 mg/dL (ref 0–40)

## 2013-01-27 LAB — MICROALBUMIN / CREATININE URINE RATIO
Creatinine, Urine: 13 mg/dL
Microalb Creat Ratio: 38.5 mg/g — ABNORMAL HIGH (ref 0.0–30.0)
Microalb, Ur: 0.5 mg/dL (ref 0.00–1.89)

## 2013-01-27 LAB — URINALYSIS, MICROSCOPIC ONLY
Bacteria, UA: NONE SEEN
CASTS: NONE SEEN
CRYSTALS: NONE SEEN
Squamous Epithelial / LPF: NONE SEEN

## 2013-01-27 LAB — HEMOGLOBIN A1C
HEMOGLOBIN A1C: 5.6 % (ref <5.7)
Mean Plasma Glucose: 114 mg/dL (ref <117)

## 2013-01-27 LAB — INSULIN, FASTING: INSULIN FASTING, SERUM: 8 u[IU]/mL (ref 3–28)

## 2013-01-27 LAB — HEPATIC FUNCTION PANEL
ALBUMIN: 4.6 g/dL (ref 3.5–5.2)
ALK PHOS: 67 U/L (ref 39–117)
ALT: 20 U/L (ref 0–35)
AST: 28 U/L (ref 0–37)
BILIRUBIN DIRECT: 0.1 mg/dL (ref 0.0–0.3)
BILIRUBIN INDIRECT: 0.4 mg/dL (ref 0.0–0.9)
BILIRUBIN TOTAL: 0.5 mg/dL (ref 0.3–1.2)
Total Protein: 7.1 g/dL (ref 6.0–8.3)

## 2013-01-27 LAB — MAGNESIUM: MAGNESIUM: 1.9 mg/dL (ref 1.5–2.5)

## 2013-01-27 LAB — VITAMIN D 25 HYDROXY (VIT D DEFICIENCY, FRACTURES): VIT D 25 HYDROXY: 86 ng/mL (ref 30–89)

## 2013-01-27 LAB — TSH: TSH: 2.119 u[IU]/mL (ref 0.350–4.500)

## 2013-02-17 ENCOUNTER — Other Ambulatory Visit (INDEPENDENT_AMBULATORY_CARE_PROVIDER_SITE_OTHER): Payer: Medicare Other | Admitting: *Deleted

## 2013-02-17 DIAGNOSIS — Z1212 Encounter for screening for malignant neoplasm of rectum: Secondary | ICD-10-CM

## 2013-02-17 LAB — POC HEMOCCULT BLD/STL (HOME/3-CARD/SCREEN)
FECAL OCCULT BLD: NEGATIVE
FECAL OCCULT BLD: NEGATIVE
Fecal Occult Blood, POC: NEGATIVE

## 2013-03-24 ENCOUNTER — Telehealth: Payer: Self-pay | Admitting: Internal Medicine

## 2013-03-24 ENCOUNTER — Other Ambulatory Visit: Payer: Self-pay | Admitting: Internal Medicine

## 2013-03-24 MED ORDER — ALPRAZOLAM 1 MG PO TABS: ORAL_TABLET | ORAL | Status: DC

## 2013-03-24 NOTE — Telephone Encounter (Signed)
PT CALLED AND SAID THE LORAZPAM? NOT WORKING AND STOPPED TAKING. REQESTED RX FOR ALPRAZOLAM, PLEASE ADVISE PT AND PHARM

## 2013-04-27 ENCOUNTER — Ambulatory Visit: Payer: Self-pay | Admitting: Physician Assistant

## 2013-04-28 ENCOUNTER — Other Ambulatory Visit: Payer: Self-pay | Admitting: Internal Medicine

## 2013-05-02 ENCOUNTER — Ambulatory Visit: Payer: Self-pay | Admitting: Physician Assistant

## 2013-05-12 ENCOUNTER — Ambulatory Visit (INDEPENDENT_AMBULATORY_CARE_PROVIDER_SITE_OTHER): Payer: Medicare Other | Admitting: Physician Assistant

## 2013-05-12 ENCOUNTER — Other Ambulatory Visit: Payer: Self-pay | Admitting: Internal Medicine

## 2013-05-12 VITALS — BP 110/68 | HR 64 | Temp 98.1°F | Resp 16 | Wt 153.0 lb

## 2013-05-12 DIAGNOSIS — F32A Depression, unspecified: Secondary | ICD-10-CM

## 2013-05-12 DIAGNOSIS — R7309 Other abnormal glucose: Secondary | ICD-10-CM

## 2013-05-12 DIAGNOSIS — Z0000 Encounter for general adult medical examination without abnormal findings: Secondary | ICD-10-CM

## 2013-05-12 DIAGNOSIS — I10 Essential (primary) hypertension: Secondary | ICD-10-CM

## 2013-05-12 DIAGNOSIS — E559 Vitamin D deficiency, unspecified: Secondary | ICD-10-CM

## 2013-05-12 DIAGNOSIS — Z789 Other specified health status: Secondary | ICD-10-CM

## 2013-05-12 DIAGNOSIS — E785 Hyperlipidemia, unspecified: Secondary | ICD-10-CM

## 2013-05-12 DIAGNOSIS — Z79899 Other long term (current) drug therapy: Secondary | ICD-10-CM

## 2013-05-12 DIAGNOSIS — F329 Major depressive disorder, single episode, unspecified: Secondary | ICD-10-CM

## 2013-05-12 DIAGNOSIS — E2839 Other primary ovarian failure: Secondary | ICD-10-CM

## 2013-05-12 NOTE — Patient Instructions (Signed)

## 2013-05-12 NOTE — Progress Notes (Signed)
Subjective:   [name] is a 68 y.o. female who presents for Medicare Annual Wellness Visit and 3 month follow up on hypertension, prediabetes, hyperlipidemia, vitamin D def.  Date of last medicare wellness visit is unknown.   Her blood pressure has been controlled at home, today their BP is BP: 110/68 mmHg She does not workout. She denies chest pain, shortness of breath, dizziness.  She is not on cholesterol medication and denies myalgias. Her cholesterol is not at goal. The cholesterol last visit was:   Lab Results  Component Value Date   CHOL 218* 01/26/2013   HDL 53 01/26/2013   LDLCALC 141* 01/26/2013   TRIG 119 01/26/2013   CHOLHDL 4.1 01/26/2013    Last A1C in the office was:  Lab Results  Component Value Date   HGBA1C 5.6 01/26/2013   Patient is on Vitamin D supplement. She is on thyroid medication. Her medication was not changed last visit. Patient denies heat / cold intolerance, nervousness and palpitations.  Lab Results  Component Value Date   TSH 2.119 01/26/2013  .  Wt Readings from Last 3 Encounters:  05/12/13 153 lb (69.4 kg)  01/26/13 148 lb 12.8 oz (67.495 kg)  12/13/12 146 lb (66.225 kg)    Names of Other Physician/Practitioners you currently use: 1. [facility]- here for primary care 2. Dr. 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 Does not see a dentist Patient Care Team: Unk Pinto, MD as PCP - General (Internal Medicine)  Medication Review Current Outpatient Prescriptions on File Prior to Visit  Medication Sig Dispense Refill  . ALPRAZolam (XANAX) 1 MG tablet TAKE 1/2 TO 1 TABLET 3 TIMES A DAY AS NEEDED FOR ANXIETY  90 tablet  1  . aspirin 81 MG tablet Take 81 mg by mouth daily.      . bumetanide (BUMEX) 2 MG tablet Take 2 mg by mouth daily.      . Cholecalciferol (VITAMIN D PO) Take 2,000 Int'l Units by mouth 2 (two) times  daily.       [name] GINKGO BILOBA PLUS PO Take 60 mg by mouth daily.      [name] LEVOTHYROXINE SODIUM PO Take 75 mcg by mouth every other day. Alternates 1 tab and 1/2 tab every other day      . ranitidine (ZANTAC) 300 MG capsule Take 300 mg by mouth as needed for heartburn.      . vitamin C (ASCORBIC ACID) 500 MG tablet Take 500 mg by mouth daily.      . Wheat Dextrin (BENEFIBER PO) Take by mouth. Takes 2 tabs per day       No current facility-administered medications on file prior to visit.    Current Problems (verified) Patient Active Problem List   Diagnosis Date Noted  . Mixed hyperlipidemia 01/26/2013  . Other abnormal glucose 01/26/2013  . Routine general medical examination at a health care facility 01/26/2013  . Unspecified vitamin D deficiency 12/13/2012  . GERD (gastroesophageal reflux disease)   . Hyperlipidemia   . Depression   . HYPERTENSION 09/09/2008    Screening Tests Health Maintenance  Topic Date Due  . Zostavax  12/01/2004  . Influenza Vaccine  08/06/2013  . Mammogram  05/07/2014  . Colonoscopy  07/07/2018  . Tetanus/tdap  01/20/2022  . Pneumococcal Polysaccharide Vaccine Age 37 And Over  Completed     Immunization History  Administered  Date(s) Administered  . Pneumococcal Polysaccharide-23 01/21/2012  . Td 01/21/2012    Preventative care: Last colonoscopy: 07/2008 Last mammogram: 05/2013 Last pap smear/pelvic exam: remote DEXA: 2010 normal  Prior vaccinations: TD or Tdap: 2014  Influenza: 2014 Pneumococcal: 2014 Shingles/Zostavax: declines  History reviewed: allergies, current medications, past family history, past medical history, past social history, past surgical history and problem list  Risk Factors: Osteoporosis: postmenopausal estrogen deficiency and dietary calcium and/or vitamin D deficiency History of fracture in the past year: no  Tobacco History  Substance Use Topics  . Smoking status: Former Research scientist (life sciences)  . Smokeless tobacco: Not on  file  . Alcohol Use: No   She does not smoke.  Patient is a former smoker. Are there smokers in your home (other than you)?  No  Alcohol Current alcohol use: none  Caffeine Current caffeine use: coffee 1 /day  Exercise Exercise limitations: The patient has no exercise limitations. Current exercise: housecleaning and walking  Nutrition/Diet Current diet: in general, a "healthy" diet    Cardiac risk factors: advanced age (older than 58 for men, 75 for women), dyslipidemia, hypertension, obesity (BMI >= 30 kg/m2) and sedentary lifestyle.  Depression Screen (Note: if answer to either of the following is "Yes", a more complete depression screening is indicated)   Q1: Over the past two weeks, have you felt down, depressed or hopeless? No  Q2: Over the past two weeks, have you felt little interest or pleasure in doing things? No  Have you lost interest or pleasure in daily life? No  Do you often feel hopeless? No  Do you cry easily over simple problems? No  Activities of Daily Living In your present state of health, do you have any difficulty performing the following activities?:  Driving? No Managing money?  No Feeding yourself? No Getting from bed to chair? No Climbing a flight of stairs? No Preparing food and eating?: No Bathing or showering? No Getting dressed: No Getting to the toilet? No Using the toilet:No Moving around from place to place: No In the past year have you fallen or had a near fall?:No   Are you sexually active?  No  Do you have more than one partner?  No  Vision Difficulties: Yes  Hearing Difficulties: No Do you often ask people to speak up or repeat themselves? No Do you experience ringing or noises in your ears? Yes Do you have difficulty understanding soft or whispered voices? No  Cognition  Do you feel that you have a problem with memory?No  Do you often misplace items? No  Do you feel safe at home?  Yes  Advanced directives Does patient  have a [place]? No Does patient have a Living Will? No   Objective:   Blood pressure 110/68, pulse 64, temperature 98.1 F (36.7 C), resp. rate 16, weight 153 lb (69.4 kg). Body mass index is 27.98 kg/(m^2).  General appearance: alert, no distress, WD/WN,  female Cognitive Testing  Alert? Yes  Normal Appearance?Yes  Oriented to person? Yes  Place? Yes   Time? Yes  Recall of three objects?  Yes  Can perform simple calculations? Yes  Displays appropriate judgment?Yes  Can read the correct time from a watch face?Yes  HEENT: normocephalic, sclerae anicteric, TMs pearly, nares patent, no discharge or erythema, pharynx normal Oral cavity: MMM, no lesions Neck: supple, no lymphadenopathy, no thyromegaly, no masses Heart: RRR, normal S1, S2, no murmurs Lungs: CTA bilaterally, no wheezes, rhonchi, or rales Abdomen: +bs,  soft, non tender, non distended, no masses, no hepatomegaly, no splenomegaly Musculoskeletal: nontender, no swelling, no obvious deformity Extremities: no edema, no cyanosis, no clubbing Pulses: 2+ symmetric, upper and lower extremities, normal cap refill Neurological: alert, oriented x 3, CN2-12 intact, strength normal upper extremities and lower extremities, sensation normal throughout, DTRs 2+ throughout, no cerebellar signs, gait normal Psychiatric: normal affect, behavior normal, pleasant  Breast: defer Gyn: defer Rectal: defer   Assessment:   1. HYPERTENSION - CBC with Differential - BASIC METABOLIC PANEL WITH GFR - Hepatic function panel - TSH  2. Hyperlipidemia - Lipid panel  3. Other abnormal glucose Discussed general issues about diabetes pathophysiology and management., Educational material distributed., Suggested low cholesterol diet., Encouraged aerobic exercise., Discussed foot care., Reminded to get yearly retinal exam. - Hemoglobin A1c - Insulin, fasting  4. Unspecified vitamin D deficiency - Vit D  25 hydroxy (rtn  osteoporosis monitoring)  5. Depression  6.Encounter for long-term (current) use of other medications - Magnesium   Plan:   During the course of the visit the patient was educated and counseled about appropriate screening and preventive services including:    Pneumococcal vaccine   Influenza vaccine  Td vaccine  Screening electrocardiogram  Screening mammography  Bone densitometry screening  Colorectal cancer screening  Diabetes screening  Glaucoma screening  Advanced directives: unknown  Screening recommendations, referrals:  Vaccinations: Tdap vaccine not indicated Influenza vaccine not indicated Pneumococcal vaccine not indicated Shingles vaccine declined Hep B vaccine not indicated  Nutrition assessed and recommended  Colonoscopy not indicated Mammogram due 05/2014 Pap smear not indicated Pelvic exam not indicated Recommended yearly ophthalmology/optometry visit for glaucoma screening and checkup Recommended yearly dental visit for hygiene and checkup Advanced directives - requested  Conditions/risks identified: BMI: Discussed weight loss, diet, and increase physical activity.  Increase physical activity: AHA recommends 150 minutes of physical activity a week.  Medications reviewed DEXA- ordered Diabetes is at goal Urinary Incontinence is not an issue: discussed non pharmacology and pharmacology options.  Fall risk: low- discussed PT, home fall assessment, medications.   Medicare Attestation I have personally reviewed: The patient's medical and social history Their use of alcohol, tobacco or illicit drugs Their current medications and supplements The patient's functional ability including ADLs,fall risks, home safety risks, cognitive, and hearing and visual impairment Diet and physical activities Evidence for depression or mood disorders  The patient's weight, height, BMI, and visual acuity have been recorded in the chart.  I have made  referrals, counseling, and provided education to the patient based on review of the above and I have provided the patient with a written personalized care plan for preventive services.     Vicie Mutters, PA-C   05/12/2013

## 2013-05-13 LAB — CBC WITH DIFFERENTIAL/PLATELET
BASOS ABS: 0.1 10*3/uL (ref 0.0–0.1)
BASOS PCT: 1 % (ref 0–1)
EOS PCT: 3 % (ref 0–5)
Eosinophils Absolute: 0.2 10*3/uL (ref 0.0–0.7)
HEMATOCRIT: 40.5 % (ref 36.0–46.0)
Hemoglobin: 13.8 g/dL (ref 12.0–15.0)
Lymphocytes Relative: 18 % (ref 12–46)
Lymphs Abs: 1.1 10*3/uL (ref 0.7–4.0)
MCH: 29.2 pg (ref 26.0–34.0)
MCHC: 34.1 g/dL (ref 30.0–36.0)
MCV: 85.6 fL (ref 78.0–100.0)
MONO ABS: 0.5 10*3/uL (ref 0.1–1.0)
Monocytes Relative: 8 % (ref 3–12)
Neutro Abs: 4.1 10*3/uL (ref 1.7–7.7)
Neutrophils Relative %: 70 % (ref 43–77)
PLATELETS: 347 10*3/uL (ref 150–400)
RBC: 4.73 MIL/uL (ref 3.87–5.11)
RDW: 14.1 % (ref 11.5–15.5)
WBC: 5.9 10*3/uL (ref 4.0–10.5)

## 2013-05-13 LAB — HEPATIC FUNCTION PANEL
ALK PHOS: 70 U/L (ref 39–117)
ALT: 17 U/L (ref 0–35)
AST: 26 U/L (ref 0–37)
Albumin: 4.6 g/dL (ref 3.5–5.2)
Bilirubin, Direct: 0.1 mg/dL (ref 0.0–0.3)
Indirect Bilirubin: 0.5 mg/dL (ref 0.2–1.2)
TOTAL PROTEIN: 7.4 g/dL (ref 6.0–8.3)
Total Bilirubin: 0.6 mg/dL (ref 0.2–1.2)

## 2013-05-13 LAB — BASIC METABOLIC PANEL WITH GFR
BUN: 14 mg/dL (ref 6–23)
CALCIUM: 9.7 mg/dL (ref 8.4–10.5)
CO2: 33 mEq/L — ABNORMAL HIGH (ref 19–32)
Chloride: 98 mEq/L (ref 96–112)
Creat: 0.65 mg/dL (ref 0.50–1.10)
GFR, Est African American: >89 mL/min
Glucose, Bld: 98 mg/dL (ref 70–99)
Potassium: 4.1 mEq/L (ref 3.5–5.3)
Sodium: 140 mEq/L (ref 135–145)

## 2013-05-13 LAB — LIPID PANEL
Cholesterol: 211 mg/dL — ABNORMAL HIGH (ref 0–200)
HDL: 46 mg/dL (ref >39)
LDL Cholesterol: 132 mg/dL — ABNORMAL HIGH (ref 0–99)
TRIGLYCERIDES: 166 mg/dL — AB (ref <150)
Total CHOL/HDL Ratio: 4.6 Ratio
VLDL: 33 mg/dL (ref 0–40)

## 2013-05-13 LAB — INSULIN, FASTING: INSULIN FASTING, SERUM: 43 u[IU]/mL — AB (ref 3–28)

## 2013-05-13 LAB — MAGNESIUM: Magnesium: 1.9 mg/dL (ref 1.5–2.5)

## 2013-05-13 LAB — VITAMIN D 25 HYDROXY (VIT D DEFICIENCY, FRACTURES): VIT D 25 HYDROXY: 98 ng/mL — AB (ref 30–89)

## 2013-05-13 LAB — HEMOGLOBIN A1C
Hgb A1c MFr Bld: 5.7 % — ABNORMAL HIGH (ref <5.7)
Mean Plasma Glucose: 117 mg/dL — ABNORMAL HIGH (ref <117)

## 2013-05-13 LAB — TSH: TSH: 3.118 u[IU]/mL (ref 0.350–4.500)

## 2013-07-28 ENCOUNTER — Ambulatory Visit: Payer: Self-pay | Admitting: Internal Medicine

## 2013-08-09 ENCOUNTER — Encounter: Payer: Self-pay | Admitting: Internal Medicine

## 2013-08-15 ENCOUNTER — Other Ambulatory Visit: Payer: Self-pay | Admitting: Internal Medicine

## 2013-08-15 ENCOUNTER — Ambulatory Visit (INDEPENDENT_AMBULATORY_CARE_PROVIDER_SITE_OTHER): Payer: Medicare Other | Admitting: Internal Medicine

## 2013-08-15 ENCOUNTER — Encounter: Payer: Self-pay | Admitting: Internal Medicine

## 2013-08-15 VITALS — BP 102/64 | HR 64 | Temp 97.9°F | Resp 16 | Ht 62.0 in | Wt 155.2 lb

## 2013-08-15 DIAGNOSIS — Z1231 Encounter for screening mammogram for malignant neoplasm of breast: Secondary | ICD-10-CM

## 2013-08-15 DIAGNOSIS — R7309 Other abnormal glucose: Secondary | ICD-10-CM

## 2013-08-15 DIAGNOSIS — I10 Essential (primary) hypertension: Secondary | ICD-10-CM

## 2013-08-15 DIAGNOSIS — E559 Vitamin D deficiency, unspecified: Secondary | ICD-10-CM

## 2013-08-15 DIAGNOSIS — E782 Mixed hyperlipidemia: Secondary | ICD-10-CM

## 2013-08-15 DIAGNOSIS — Z79899 Other long term (current) drug therapy: Secondary | ICD-10-CM | POA: Insufficient documentation

## 2013-08-15 LAB — CBC WITH DIFFERENTIAL/PLATELET
BASOS ABS: 0.1 10*3/uL (ref 0.0–0.1)
Basophils Relative: 1 % (ref 0–1)
EOS ABS: 0.2 10*3/uL (ref 0.0–0.7)
Eosinophils Relative: 3 % (ref 0–5)
HCT: 39.5 % (ref 36.0–46.0)
Hemoglobin: 13.6 g/dL (ref 12.0–15.0)
LYMPHS PCT: 21 % (ref 12–46)
Lymphs Abs: 1.4 10*3/uL (ref 0.7–4.0)
MCH: 30 pg (ref 26.0–34.0)
MCHC: 34.4 g/dL (ref 30.0–36.0)
MCV: 87 fL (ref 78.0–100.0)
MONO ABS: 0.6 10*3/uL (ref 0.1–1.0)
Monocytes Relative: 9 % (ref 3–12)
Neutro Abs: 4.6 10*3/uL (ref 1.7–7.7)
Neutrophils Relative %: 66 % (ref 43–77)
Platelets: 369 10*3/uL (ref 150–400)
RBC: 4.54 MIL/uL (ref 3.87–5.11)
RDW: 13.9 % (ref 11.5–15.5)
WBC: 6.9 10*3/uL (ref 4.0–10.5)

## 2013-08-15 NOTE — Patient Instructions (Signed)

## 2013-08-15 NOTE — Progress Notes (Signed)
Patient ID: [name], female   DOB: 1945-09-16, 68 y.o.   MRN: [id number]   This very nice 68 y.o.MWF presents for 3 month follow up with Hypertension, Hyperlipidemia, Hypothyroidism,  Pre-Diabetes and Vitamin D Deficiency.    Patient is treated for HTN & BP has been controlled at home. Today's BP was 102/64 mmHg. Patient denies any cardiac type chest pain, palpitations, dyspnea/orthopnea/PND, dizziness, claudication, or dependent edema.   Hyperlipidemia is near goal with diet as patient has REFUSED to take meds for cholesterol. Patient denies myalgias or other med SE's. Last Lipids were  Cholesterol, Total 211*; HDL  46; LDL  132*; Triglycerides 166* on 05/12/2013.   Also, the patient has history of PreDiabetes with A1c 5.7% in 2011 and patient denies any symptoms of reactive hypoglycemia, diabetic polys, paresthesias or visual blurring.  Last A1c was  5.7%  On 05/12/2013.    Further, Patient has history of Vitamin D Deficiency and patient supplements vitamin D without any suspected side-effects. Last vitamin D was 98 on 05/12/2013.   Medication List   ALPRAZolam 1 MG tablet  Commonly known as:  XANAX  TAKE 1/2 TO 1 TABLET 3 TIMES A DAY AS NEEDED FOR ANXIETY     aspirin 81 MG tablet  Take 81 mg by mouth daily.     BENEFIBER PO  Take by mouth. Takes 2 tabs per day     bumetanide 2 MG tablet  Commonly known as:  BUMEX  Take 2 mg by mouth daily.     GINKGO BILOBA PLUS PO  Take 60 mg by mouth daily.     LEVOTHYROXINE SODIUM PO  Take 75 mcg by mouth every other day. Alternates 1 tab and 1/2 tab every other day     ranitidine 300 MG capsule  Commonly known as:  ZANTAC  Take 300 mg by mouth as needed for heartburn.     vitamin C 500 MG tablet  Commonly known as:  ASCORBIC ACID  Take 500 mg by mouth daily.     VITAMIN D PO  Take 2,000 Int'l Units by mouth 2 (two) times daily.     Allergies  Allergen Reactions  . Aspirin     REACTION: GI ulcers  . Fish Oil     Allergic to  fish oil and shell fish  . Morphine And Related   . Sulfa Antibiotics   . Vicodin [Hydrocodone-Acetaminophen]    PMHx:   Past Medical History  Diagnosis Date  . Hypertension   . GERD (gastroesophageal reflux disease)   . Hyperlipidemia   . Vitamin D deficiency   . Depression    FHx:    Reviewed / unchanged SHx:    Reviewed / unchanged  Systems Review:  Constitutional: Denies fever, chills, wt changes, headaches, insomnia, fatigue, night sweats, change in appetite. Eyes: Denies redness, blurred vision, diplopia, discharge, itchy, watery eyes.  ENT: Denies discharge, congestion, post nasal drip, epistaxis, sore throat, earache, hearing loss, dental pain, tinnitus, vertigo, sinus pain, snoring.  CV: Denies chest pain, palpitations, irregular heartbeat, syncope, dyspnea, diaphoresis, orthopnea, PND, claudication or edema. Respiratory: denies cough, dyspnea, DOE, pleurisy, hoarseness, laryngitis, wheezing.  Gastrointestinal: Denies dysphagia, odynophagia, heartburn, reflux, water brash, abdominal pain or cramps, nausea, vomiting, bloating, diarrhea, constipation, hematemesis, melena, hematochezia  or hemorrhoids. Genitourinary: Denies dysuria, frequency, urgency, nocturia, hesitancy, discharge, hematuria or flank pain. Musculoskeletal: Denies arthralgias, myalgias, stiffness, jt. swelling, pain, limping or strain/sprain.  Skin: Denies pruritus, rash, hives, warts, acne, eczema or change in skin lesion(s).  Neuro: No weakness, tremor, incoordination, spasms, paresthesia or pain. Psychiatric: Denies confusion, memory loss or sensory loss. Endo: Denies change in weight, skin or hair change.  Heme/Lymph: No excessive bleeding, bruising or enlarged lymph nodes.  Exam:  BP 102/64  Pulse 64  Temp(Src) 97.9 F (36.6 C) (Temporal)  Resp 16  Ht 5\' 2"  (1.575 m)  Wt 155 lb 3.2 oz (70.398 kg)  BMI 28.38 kg/m2  Appears well nourished and in no distress. Eyes: PERRLA, EOMs, conjunctiva no  swelling or erythema. Sinuses: No frontal/maxillary tenderness ENT/Mouth: EAC's clear, TM's nl w/o erythema, bulging. Nares clear w/o erythema, swelling, exudates. Oropharynx clear without erythema or exudates. Oral hygiene is good. Tongue normal, non obstructing. Hearing intact.  Neck: Supple. Thyroid nl. Car 2+/2+ without bruits, nodes or JVD. Chest: Respirations nl with BS clear & equal w/o rales, rhonchi, wheezing or stridor.  Cor: Heart sounds normal w/ regular rate and rhythm without sig. murmurs, gallops, clicks, or rubs. Peripheral pulses normal and equal  without edema.  Abdomen: Soft & bowel sounds normal. Non-tender w/o guarding, rebound, hernias, masses, or organomegaly.  Lymphatics: Unremarkable.  Musculoskeletal: Full ROM all peripheral extremities, joint stability, 5/5 strength, and normal gait.  Skin: Warm, dry without exposed rashes, lesions or ecchymosis apparent.  Neuro: Cranial nerves intact, reflexes equal bilaterally. Sensory-motor testing grossly intact. Tendon reflexes grossly intact.  Pysch: Alert & oriented x 3. Insight and judgement nl & appropriate. No ideations.  Assessment and Plan:  1. Hypertension - Continue monitor blood pressure at home. Continue diet/meds same.  2. Hyperlipidemia - Continue diet/meds, exercise,& lifestyle modifications. Continue monitor periodic cholesterol/liver & renal functions   3. Pre-Diabetes - Continue diet, exercise, lifestyle modifications. Monitor appropriate labs.  4. Vitamin D Deficiency  5. Hypothyroidism   Recommended regular exercise, BP monitoring, weight control, and discussed med and SE's. Recommended labs to assess and monitor clinical status. Further disposition pending results of labs.

## 2013-08-16 LAB — HEPATIC FUNCTION PANEL
ALBUMIN: 4.9 g/dL (ref 3.5–5.2)
ALK PHOS: 73 U/L (ref 39–117)
ALT: 15 U/L (ref 0–35)
AST: 22 U/L (ref 0–37)
Bilirubin, Direct: 0.1 mg/dL (ref 0.0–0.3)
Indirect Bilirubin: 0.4 mg/dL (ref 0.2–1.2)
Total Bilirubin: 0.5 mg/dL (ref 0.2–1.2)
Total Protein: 7.3 g/dL (ref 6.0–8.3)

## 2013-08-16 LAB — HEMOGLOBIN A1C
Hgb A1c MFr Bld: 5.5 % (ref <5.7)
MEAN PLASMA GLUCOSE: 111 mg/dL (ref <117)

## 2013-08-16 LAB — BASIC METABOLIC PANEL WITH GFR
BUN: 18 mg/dL (ref 6–23)
CHLORIDE: 98 meq/L (ref 96–112)
CO2: 29 mEq/L (ref 19–32)
CREATININE: 0.69 mg/dL (ref 0.50–1.10)
Calcium: 10.2 mg/dL (ref 8.4–10.5)
GFR, Est African American: >89 mL/min
GFR, Est Non African American: >89 mL/min
Glucose, Bld: 70 mg/dL (ref 70–99)
Potassium: 5 mEq/L (ref 3.5–5.3)
Sodium: 142 mEq/L (ref 135–145)

## 2013-08-16 LAB — LIPID PANEL
Cholesterol: 232 mg/dL — ABNORMAL HIGH (ref 0–200)
HDL: 49 mg/dL (ref >39)
LDL CALC: 139 mg/dL — AB (ref 0–99)
TRIGLYCERIDES: 222 mg/dL — AB (ref <150)
Total CHOL/HDL Ratio: 4.7 Ratio
VLDL: 44 mg/dL — ABNORMAL HIGH (ref 0–40)

## 2013-08-16 LAB — INSULIN, FASTING: INSULIN FASTING, SERUM: 7 u[IU]/mL (ref 3–28)

## 2013-08-16 LAB — MAGNESIUM: Magnesium: 2.1 mg/dL (ref 1.5–2.5)

## 2013-08-16 LAB — TSH: TSH: 5.194 u[IU]/mL — AB (ref 0.350–4.500)

## 2013-08-16 LAB — VITAMIN D 25 HYDROXY (VIT D DEFICIENCY, FRACTURES): Vit D, 25-Hydroxy: 85 ng/mL (ref 30–89)

## 2013-08-17 ENCOUNTER — Ambulatory Visit (HOSPITAL_COMMUNITY)
Admission: RE | Admit: 2013-08-17 | Discharge: 2013-08-17 | Disposition: A | Discharge status: Home / Self Care | Payer: BC Managed Care – PPO | Source: Ambulatory Visit | Attending: Internal Medicine | Admitting: Internal Medicine

## 2013-08-17 DIAGNOSIS — Z1231 Encounter for screening mammogram for malignant neoplasm of breast: Secondary | ICD-10-CM | POA: Diagnosis present

## 2013-08-22 ENCOUNTER — Other Ambulatory Visit: Payer: Self-pay | Admitting: Emergency Medicine

## 2013-09-19 ENCOUNTER — Other Ambulatory Visit: Payer: Self-pay | Admitting: Physician Assistant

## 2013-10-21 ENCOUNTER — Other Ambulatory Visit: Payer: Self-pay | Admitting: Internal Medicine

## 2013-11-17 ENCOUNTER — Ambulatory Visit (INDEPENDENT_AMBULATORY_CARE_PROVIDER_SITE_OTHER): Payer: Medicare Other | Admitting: Physician Assistant

## 2013-11-17 VITALS — BP 120/78 | HR 76 | Temp 97.9°F | Resp 16 | Wt 154.0 lb

## 2013-11-17 DIAGNOSIS — K2100 Gastro-esophageal reflux disease with esophagitis, without bleeding: Secondary | ICD-10-CM

## 2013-11-17 DIAGNOSIS — I10 Essential (primary) hypertension: Secondary | ICD-10-CM

## 2013-11-17 DIAGNOSIS — F329 Major depressive disorder, single episode, unspecified: Secondary | ICD-10-CM

## 2013-11-17 DIAGNOSIS — R1011 Right upper quadrant pain: Secondary | ICD-10-CM

## 2013-11-17 DIAGNOSIS — F32A Depression, unspecified: Secondary | ICD-10-CM

## 2013-11-17 DIAGNOSIS — E559 Vitamin D deficiency, unspecified: Secondary | ICD-10-CM

## 2013-11-17 DIAGNOSIS — R7303 Prediabetes: Secondary | ICD-10-CM

## 2013-11-17 DIAGNOSIS — E782 Mixed hyperlipidemia: Secondary | ICD-10-CM

## 2013-11-17 DIAGNOSIS — Z79899 Other long term (current) drug therapy: Secondary | ICD-10-CM

## 2013-11-17 NOTE — Patient Instructions (Addendum)
    Bad carbs also include fruit juice, alcohol, and sweet tea. These are empty calories that do not signal to your brain that you are full.   Please remember the good carbs are still carbs which convert into sugar. So please measure them out no more than 1/2-1 cup of rice, oatmeal, pasta, and beans  Veggies are however free foods! Pile them on.   Not all fruit is created equal. Please see the list below, the fruit at the bottom is higher in sugars than the fruit at the top. Please avoid all dried fruits.    Cholelithiasis Cholelithiasis (also called gallstones) is a form of gallbladder disease in which gallstones form in your gallbladder. The gallbladder is an organ that stores bile made in the liver, which helps digest fats. Gallstones begin as small crystals and slowly grow into stones. Gallstone pain occurs when the gallbladder spasms and a gallstone is blocking the duct. Pain can also occur when a stone passes out of the duct.  RISK FACTORS  Being female.   Having multiple pregnancies. Health care providers sometimes advise removing diseased gallbladders before future pregnancies.   Being obese.  Eating a diet heavy in fried foods and fat.   Being older than 77 years and increasing age.   Prolonged use of medicines containing female hormones.   Having diabetes mellitus.   Rapidly losing weight.   Having a family history of gallstones (heredity).  SYMPTOMS  Nausea.   Vomiting.  Abdominal pain.   Yellowing of the skin (jaundice).   Sudden pain. It may persist from several minutes to several hours.  Fever.   Tenderness to the touch. In some cases, when gallstones do not move into the bile duct, people have no pain or symptoms. These are called "silent" gallstones.  TREATMENT Silent gallstones do not need treatment. In severe cases, emergency surgery may be required. Options for treatment include:  Surgery to remove the gallbladder. This is the most  common treatment.  Medicines. These do not always work and may take 6-12 months or more to work.  Shock wave treatment (extracorporeal biliary lithotripsy). In this treatment an ultrasound machine sends shock waves to the gallbladder to break gallstones into smaller pieces that can pass into the intestines or be dissolved by medicine. HOME CARE INSTRUCTIONS   Only take over-the-counter or prescription medicines for pain, discomfort, or fever as directed by your health care provider.   Follow a low-fat diet until seen again by your health care provider. Fat causes the gallbladder to contract, which can result in pain.   Follow up with your health care provider as directed. Attacks are almost always recurrent and surgery is usually required for permanent treatment.  SEEK IMMEDIATE MEDICAL CARE IF:   Your pain increases and is not controlled by medicines.   You have a fever or persistent symptoms for more than 2-3 days.   You have a fever and your symptoms suddenly get worse.   You have persistent nausea and vomiting.  MAKE SURE YOU:   Understand these instructions.  Will watch your condition.  Will get help right away if you are not doing well or get worse. Document Released: 12/19/2004 Document Revised: 08/25/2012 Document Reviewed: 06/16/2012 [facility] Patient Information 2015 [place], [place]. This information is not intended to replace advice given to you by your health care provider. Make sure you discuss any questions you have with your health care provider.

## 2013-11-17 NOTE — Progress Notes (Signed)
Assessment and Plan:  Hypertension: Continue medication, monitor blood pressure at home. Continue DASH diet.  Reminder to go to the ER if any CP, SOB, nausea, dizziness, severe HA, changes vision/speech, left arm numbness and tingling, and jaw pain. Cholesterol: Continue diet and exercise. Check cholesterol.  Pre-diabetes-Continue diet and exercise. Check A1C Vitamin D Def- check level and continue medications.  Hypothyroidism-check TSH level, especially with recent change, continue medications the same, reminded to take on an empty stomach 30-26mins before food.  RUQ pain- declines [place] at this time, will do low fat diet, try PPI x 5 days.   Continue diet and meds as discussed. Further disposition pending results of labs.  HPI 68 y.o. female  presents for 3 month follow up with hypertension, hyperlipidemia, prediabetes and vitamin D. Her blood pressure has been controlled at home, today their BP is BP: 120/78 mmHg She does workout. She denies chest pain, shortness of breath, dizziness.  She is not on cholesterol medication and denies myalgias. Her cholesterol is not at goal. The cholesterol last visit was:   Lab Results  Component Value Date   CHOL 232* 08/15/2013   HDL 49 08/15/2013   LDLCALC 139* 08/15/2013   TRIG 222* 08/15/2013   CHOLHDL 4.7 08/15/2013   She has been working on diet and exercise for prediabetes, and denies paresthesia of the feet, polydipsia, polyuria and visual disturbances. Last A1C in the office was:  Lab Results  Component Value Date   HGBA1C 5.5 08/15/2013   Patient is on Vitamin D supplement.   Lab Results  Component Value Date   VD25OH 33 08/15/2013     She is on thyroid medication. Her medication was changed last visit, she is on 1 pill 5 days a week and 1/2 pill 2 days a week. She did not come back for a follow up.   Lab Results  Component Value Date   TSH 5.194* 08/15/2013  .  She is complains of right upper quadrant pain intermittently for 2-3  months, sharp pain that radiates to right shoulder blade,last 1-10 mins, no accompaniments, nothing worse/nothing better.   Current Medications:  Current Outpatient Prescriptions on File Prior to Visit  Medication Sig Dispense Refill  . ALPRAZolam (XANAX) 1 MG tablet TAKE 1/2-1 TABLET THREE TIMES A DAY AS NEEDED 90 tablet 2  . aspirin 81 MG tablet Take 81 mg by mouth daily.    . bumetanide (BUMEX) 2 MG tablet TAKE 1 TABLET BY MOUTH EVERY DAY FOR BP AND FLUID 90 tablet 3  . Cholecalciferol (VITAMIN D PO) Take 2,000 Int'l Units by mouth 2 (two) times daily.     [name] GINKGO BILOBA PLUS PO Take 60 mg by mouth daily.    [name] levothyroxine (SYNTHROID, LEVOTHROID) 75 MCG tablet TAKE 1 TABLET EVERY DAY 90 tablet 1  . ranitidine (ZANTAC) 300 MG capsule Take 300 mg by mouth as needed for heartburn.    . vitamin C (ASCORBIC ACID) 500 MG tablet Take 500 mg by mouth daily.    . Wheat Dextrin (BENEFIBER PO) Take by mouth. Takes 2 tabs per day     No current facility-administered medications on file prior to visit.   Medical History:  Past Medical History  Diagnosis Date  . Hypertension   . GERD (gastroesophageal reflux disease)   . Hyperlipidemia   . Vitamin D deficiency   . Depression    Allergies:  Allergies  Allergen Reactions  . Aspirin     REACTION: GI ulcers  .  Fish Oil     Allergic to fish oil and shell fish  . Morphine And Related   . Sulfa Antibiotics   . Vicodin [Hydrocodone-Acetaminophen]     Review of Systems: [X]  = complains of  [ ]  = denies  General: Fatigue [ ]  Fever [ ]  Chills [ ]  Weakness [ ]   Insomnia [ ]  Eyes: Redness [ ]  Blurred vision [ ]  Diplopia [ ]   ENT: Congestion [ ]  Sinus Pain [ ]  Post Nasal Drip [ ]  Sore Throat [ ]  Earache [ ]   Cardiac: Chest pain/pressure [ ]  SOB [ ]  Orthopnea [ ]   Palpitations [ ]   Paroxysmal nocturnal dyspnea[ ]  Claudication [ ]  Edema [ ]   Pulmonary: Cough [ ]  Wheezing[ ]   SOB [ ]   Snoring [ ]   GI: Nausea [ ]  Vomiting[ ]  Dysphagia[ ]  Heartburn[  ] Abdominal pain [x ] Constipation [ ] ; Diarrhea [ ] ; BRBPR [ ]  Melena[ ]  GU: Hematuria[ ]  Dysuria [ ]  Nocturia[ ]  Urgency [ ]   Hesitancy [ ]  Discharge [ ]  Neuro: Headaches[x ] Vertigo[ ]  Paresthesias[ ]  Spasm [ ]  Speech changes [ ]  Incoordination [ ]   Ortho: Arthritis [ ]  Joint pain [ ]  Muscle pain [ ]  Joint swelling [ ]  Back Pain [ ]  Skin:  Rash [ ]   Pruritis [ ]  Change in skin lesion [ ]   Psych: Depression[ ]  Anxiety[ ]  Confusion [ ]  Memory loss [ ]   Heme/Lypmh: Bleeding [ ]  Bruising [ ]  Enlarged lymph nodes [ ]   Endocrine: Visual blurring [ ]  Paresthesia [ ]  Polyuria [ ]  Polydypsea [ ]    Heat/cold intolerance [ ]  Hypoglycemia [ ]   Family history- Review and unchanged Social history- Review and unchanged Physical Exam: BP 120/78 mmHg  Pulse 76  Temp(Src) 97.9 F (36.6 C)  Resp 16  Wt 154 lb (69.854 kg) Wt Readings from Last 3 Encounters:  11/17/13 154 lb (69.854 kg)  08/15/13 155 lb 3.2 oz (70.398 kg)  05/12/13 153 lb (69.4 kg)   General Appearance: Well nourished, in no apparent distress. Eyes: PERRLA, EOMs, conjunctiva no swelling or erythema Sinuses: No Frontal/maxillary tenderness ENT/Mouth: Ext aud canals clear, TMs without erythema, bulging. No erythema, swelling, or exudate on post pharynx.  Tonsils not swollen or erythematous. Hearing normal.  Neck: Supple, thyroid normal.  Respiratory: Respiratory effort normal, BS equal bilaterally without rales, rhonchi, wheezing or stridor.  Cardio: RRR with no MRGs. Brisk peripheral pulses without edema.  Abdomen: + BS, soft, tender RUQ, + murphy's sign without rebound, guarding, mass or hepatosplenomegaly Lymphatics: Non tender without lymphadenopathy.  Musculoskeletal: Full ROM, 5/5 strength, normal gait.  Skin: Warm, dry without rashes, lesions, ecchymosis.  Neuro: Cranial nerves intact. Normal muscle tone, no cerebellar symptoms. Sensation intact.  Psych: Awake and oriented X 3, normal affect, Insight and Judgment appropriate.     Vicie Mutters, PA-C 2:58 PM [facility]

## 2013-11-18 LAB — MAGNESIUM: MAGNESIUM: 2.2 mg/dL (ref 1.5–2.5)

## 2013-11-18 LAB — CBC WITH DIFFERENTIAL/PLATELET
BASOS ABS: 0.1 10*3/uL (ref 0.0–0.1)
Basophils Relative: 1 % (ref 0–1)
Eosinophils Absolute: 0.2 10*3/uL (ref 0.0–0.7)
Eosinophils Relative: 4 % (ref 0–5)
HEMATOCRIT: 38.3 % (ref 36.0–46.0)
HEMOGLOBIN: 12.8 g/dL (ref 12.0–15.0)
LYMPHS PCT: 23 % (ref 12–46)
Lymphs Abs: 1.4 10*3/uL (ref 0.7–4.0)
MCH: 29.2 pg (ref 26.0–34.0)
MCHC: 33.4 g/dL (ref 30.0–36.0)
MCV: 87.2 fL (ref 78.0–100.0)
MONO ABS: 0.5 10*3/uL (ref 0.1–1.0)
MONOS PCT: 8 % (ref 3–12)
NEUTROS ABS: 3.8 10*3/uL (ref 1.7–7.7)
Neutrophils Relative %: 64 % (ref 43–77)
Platelets: 262 10*3/uL (ref 150–400)
RBC: 4.39 MIL/uL (ref 3.87–5.11)
RDW: 13.8 % (ref 11.5–15.5)
WBC: 5.9 10*3/uL (ref 4.0–10.5)

## 2013-11-18 LAB — HEPATIC FUNCTION PANEL
ALT: 17 U/L (ref 0–35)
AST: 29 U/L (ref 0–37)
Albumin: 4.6 g/dL (ref 3.5–5.2)
Alkaline Phosphatase: 78 U/L (ref 39–117)
BILIRUBIN DIRECT: 0.1 mg/dL (ref 0.0–0.3)
BILIRUBIN INDIRECT: 0.4 mg/dL (ref 0.2–1.2)
Total Bilirubin: 0.5 mg/dL (ref 0.2–1.2)
Total Protein: 7.4 g/dL (ref 6.0–8.3)

## 2013-11-18 LAB — LIPID PANEL
CHOL/HDL RATIO: 5.1 ratio
Cholesterol: 238 mg/dL — ABNORMAL HIGH (ref 0–200)
HDL: 47 mg/dL (ref >39)
LDL CALC: 163 mg/dL — AB (ref 0–99)
TRIGLYCERIDES: 139 mg/dL (ref <150)
VLDL: 28 mg/dL (ref 0–40)

## 2013-11-18 LAB — BASIC METABOLIC PANEL WITH GFR
BUN: 15 mg/dL (ref 6–23)
CO2: 28 mEq/L (ref 19–32)
Calcium: 9.7 mg/dL (ref 8.4–10.5)
Chloride: 98 mEq/L (ref 96–112)
Creat: 0.75 mg/dL (ref 0.50–1.10)
GFR, EST NON AFRICAN AMERICAN: 82 mL/min
Glucose, Bld: 94 mg/dL (ref 70–99)
POTASSIUM: 4.3 meq/L (ref 3.5–5.3)
Sodium: 140 mEq/L (ref 135–145)

## 2013-11-18 LAB — HEMOGLOBIN A1C
HEMOGLOBIN A1C: 5.7 % — AB (ref <5.7)
Mean Plasma Glucose: 117 mg/dL — ABNORMAL HIGH (ref <117)

## 2013-11-18 LAB — TSH: TSH: 1.506 u[IU]/mL (ref 0.350–4.500)

## 2013-11-18 LAB — VITAMIN D 25 HYDROXY (VIT D DEFICIENCY, FRACTURES): Vit D, 25-Hydroxy: 97 ng/mL — ABNORMAL HIGH (ref 30–89)

## 2014-01-30 ENCOUNTER — Encounter: Payer: Self-pay | Admitting: Internal Medicine

## 2014-02-10 ENCOUNTER — Other Ambulatory Visit: Payer: Self-pay | Admitting: Internal Medicine

## 2014-02-23 ENCOUNTER — Encounter: Payer: Self-pay | Admitting: Internal Medicine

## 2014-02-23 ENCOUNTER — Ambulatory Visit (INDEPENDENT_AMBULATORY_CARE_PROVIDER_SITE_OTHER): Payer: PPO | Admitting: Internal Medicine

## 2014-02-23 VITALS — BP 104/66 | HR 72 | Temp 97.3°F | Resp 16 | Ht 62.0 in | Wt 153.0 lb

## 2014-02-23 DIAGNOSIS — E782 Mixed hyperlipidemia: Secondary | ICD-10-CM

## 2014-02-23 DIAGNOSIS — Z9181 History of falling: Secondary | ICD-10-CM

## 2014-02-23 DIAGNOSIS — F32A Depression, unspecified: Secondary | ICD-10-CM

## 2014-02-23 DIAGNOSIS — Z1212 Encounter for screening for malignant neoplasm of rectum: Secondary | ICD-10-CM

## 2014-02-23 DIAGNOSIS — K2100 Gastro-esophageal reflux disease with esophagitis, without bleeding: Secondary | ICD-10-CM

## 2014-02-23 DIAGNOSIS — E559 Vitamin D deficiency, unspecified: Secondary | ICD-10-CM

## 2014-02-23 DIAGNOSIS — F329 Major depressive disorder, single episode, unspecified: Secondary | ICD-10-CM

## 2014-02-23 DIAGNOSIS — I10 Essential (primary) hypertension: Secondary | ICD-10-CM

## 2014-02-23 DIAGNOSIS — R7309 Other abnormal glucose: Secondary | ICD-10-CM

## 2014-02-23 DIAGNOSIS — R7303 Prediabetes: Secondary | ICD-10-CM

## 2014-02-23 DIAGNOSIS — Z1331 Encounter for screening for depression: Secondary | ICD-10-CM

## 2014-02-23 DIAGNOSIS — Z79899 Other long term (current) drug therapy: Secondary | ICD-10-CM

## 2014-02-23 NOTE — Patient Instructions (Signed)

## 2014-02-24 LAB — BASIC METABOLIC PANEL WITH GFR
BUN: 16 mg/dL (ref 6–23)
CHLORIDE: 99 meq/L (ref 96–112)
CO2: 34 mEq/L — ABNORMAL HIGH (ref 19–32)
Calcium: 9.8 mg/dL (ref 8.4–10.5)
Creat: 0.64 mg/dL (ref 0.50–1.10)
GFR, Est African American: >89 mL/min
Glucose, Bld: 73 mg/dL (ref 70–99)
POTASSIUM: 4.2 meq/L (ref 3.5–5.3)
Sodium: 143 mEq/L (ref 135–145)

## 2014-02-24 LAB — HEPATIC FUNCTION PANEL
ALBUMIN: 4.5 g/dL (ref 3.5–5.2)
ALK PHOS: 69 U/L (ref 39–117)
ALT: 15 U/L (ref 0–35)
AST: 21 U/L (ref 0–37)
Bilirubin, Direct: 0.1 mg/dL (ref 0.0–0.3)
Indirect Bilirubin: 0.4 mg/dL (ref 0.2–1.2)
TOTAL PROTEIN: 7.1 g/dL (ref 6.0–8.3)
Total Bilirubin: 0.5 mg/dL (ref 0.2–1.2)

## 2014-02-24 LAB — CBC WITH DIFFERENTIAL/PLATELET
Basophils Absolute: 0 10*3/uL (ref 0.0–0.1)
Basophils Relative: 0 % (ref 0–1)
EOS ABS: 0.1 10*3/uL (ref 0.0–0.7)
Eosinophils Relative: 2 % (ref 0–5)
HEMATOCRIT: 40 % (ref 36.0–46.0)
Hemoglobin: 13 g/dL (ref 12.0–15.0)
LYMPHS ABS: 1.7 10*3/uL (ref 0.7–4.0)
Lymphocytes Relative: 25 % (ref 12–46)
MCH: 29 pg (ref 26.0–34.0)
MCHC: 32.5 g/dL (ref 30.0–36.0)
MCV: 89.1 fL (ref 78.0–100.0)
MONOS PCT: 9 % (ref 3–12)
MPV: 10.4 fL (ref 8.6–12.4)
Monocytes Absolute: 0.6 10*3/uL (ref 0.1–1.0)
NEUTROS PCT: 64 % (ref 43–77)
Neutro Abs: 4.3 10*3/uL (ref 1.7–7.7)
Platelets: 350 10*3/uL (ref 150–400)
RBC: 4.49 MIL/uL (ref 3.87–5.11)
RDW: 13.7 % (ref 11.5–15.5)
WBC: 6.7 10*3/uL (ref 4.0–10.5)

## 2014-02-24 LAB — MICROALBUMIN / CREATININE URINE RATIO
Creatinine, Urine: 37.5 mg/dL
MICROALB/CREAT RATIO: 10.7 mg/g (ref 0.0–30.0)
Microalb, Ur: 0.4 mg/dL (ref <2.0)

## 2014-02-24 LAB — LIPID PANEL
CHOLESTEROL: 209 mg/dL — AB (ref 0–200)
HDL: 51 mg/dL (ref >39)
LDL CALC: 128 mg/dL — AB (ref 0–99)
Total CHOL/HDL Ratio: 4.1 Ratio
Triglycerides: 152 mg/dL — ABNORMAL HIGH (ref <150)
VLDL: 30 mg/dL (ref 0–40)

## 2014-02-24 LAB — MAGNESIUM: Magnesium: 2 mg/dL (ref 1.5–2.5)

## 2014-02-24 LAB — HEMOGLOBIN A1C
HEMOGLOBIN A1C: 5.5 % (ref <5.7)
Mean Plasma Glucose: 111 mg/dL (ref <117)

## 2014-02-24 LAB — INSULIN, FASTING: INSULIN FASTING, SERUM: 5.7 u[IU]/mL (ref 2.0–19.6)

## 2014-02-24 LAB — URINALYSIS, MICROSCOPIC ONLY
Bacteria, UA: NONE SEEN
CRYSTALS: NONE SEEN
Casts: NONE SEEN
SQUAMOUS EPITHELIAL / LPF: NONE SEEN

## 2014-02-24 LAB — VITAMIN D 25 HYDROXY (VIT D DEFICIENCY, FRACTURES): VIT D 25 HYDROXY: 71 ng/mL (ref 30–100)

## 2014-02-24 LAB — TSH: TSH: 2.075 u[IU]/mL (ref 0.350–4.500)

## 2014-02-26 ENCOUNTER — Encounter: Payer: Self-pay | Admitting: Internal Medicine

## 2014-02-26 NOTE — Progress Notes (Signed)
Patient ID: [name], female   DOB: 02-Mar-1945, 69 y.o.   MRN: [id number]  Annual Comprehensive Examination  This very nice 69 y.o. recently widowed WF presents for complete physical.  Patient has been followed for HTN,   Prediabetes, Hyperlipidemia, and Vitamin D Deficiency.    HTN predates since 1995. Patient's BP has been controlled at home and patient denies any cardiac symptoms as chest pain, palpitations, shortness of breath, dizziness or ankle swelling. Today's BP: 104/66 mmHg    Patient's hyperlipidemia is not controlled with diet and medications. Patient denies myalgias or other medication SE's. Last lipids were not at goal - Total Chol 209; HDL 51; LDL 128*; Trig 152 on 02/23/2014.   Patient has  prediabetes predating since 2011 with an A1c 5.7% and patient denies reactive hypoglycemic symptoms, visual blurring, diabetic polys, or paresthesias. Last A1c was 5.5% on 02/23/2014.   Finally, patient has history of Vitamin D Deficiency and last Vitamin D was 71 on 02/23/2014.  Medication Sig  . ALPRAZolam (XANAX) 1 MG tablet TAKE 1/2 TO 1 TABLET 3 TIMES A DAY AS NEEDED  . aspirin 81 MG tablet Take 81 mg by mouth daily.  . bumetanide (BUMEX) 2 MG tablet TAKE 1 TABLET BY MOUTH EVERY DAY FOR BP AND FLUID  . Cholecalciferol (VITAMIN D PO) Take 2,000 Int'l Units by mouth 2 (two) times daily.   [name] GINKGO BILOBA PLUS PO Take 60 mg by mouth daily.  [name] levothyroxine (SYNTHROID, LEVOTHROID) 75 MCG tablet TAKE 1 TABLET EVERY DAY  . ranitidine (ZANTAC) 300 MG capsule Take 300 mg by mouth as needed for heartburn.  . vitamin C (ASCORBIC ACID) 500 MG tablet Take 500 mg by mouth daily.  . Wheat Dextrin (BENEFIBER PO) Take by mouth. Takes 2 tabs per day   Allergies  Allergen Reactions  . Aspirin     REACTION: GI ulcers  . Fish Oil     Allergic to fish oil and shell fish  . Morphine And Related   . Sulfa Antibiotics   . Vicodin [Hydrocodone-Acetaminophen]    Past Medical History  Diagnosis  Date  . Hypertension   . GERD (gastroesophageal reflux disease)   . Hyperlipidemia   . Vitamin D deficiency   . Depression    Health Maintenance  Topic Date Due  . ZOSTAVAX  12/01/2004  . INFLUENZA VACCINE  08/07/2014  . MAMMOGRAM  08/18/2015  . COLONOSCOPY  07/07/2018  . TETANUS/TDAP  01/20/2022  . DEXA SCAN  Completed  . PNEUMOCOCCAL POLYSACCHARIDE VACCINE AGE 70 AND OVER  Completed   Immunization History  Administered Date(s) Administered  . Pneumococcal Polysaccharide-23 01/21/2012  . Td 01/21/2012   Past Surgical History  Procedure Laterality Date  . Back surgery    . Appendectomy    . Cesarean section    . Abdominal hysterectomy  1981    partial  . Salpingoophorectomy  1981 left    1982 right   Family History  Problem Relation Age of Onset  . Diabetes Mother   . Kidney disease Mother   . Heart disease Mother   . Hypertension Father   . Heart disease Father   . Heart disease Sister    History  Substance Use Topics  . Smoking status: Former Research scientist (life sciences)  . Smokeless tobacco: Not on file  . Alcohol Use: No    ROS Constitutional: Denies fever, chills, weight loss/gain, headaches, insomnia, fatigue, night sweats, and change in appetite. Eyes: Denies redness, blurred vision, diplopia, discharge, itchy, watery eyes.  ENT: Denies discharge, congestion, post nasal drip, epistaxis, sore throat, earache, hearing loss, dental pain, Tinnitus, Vertigo, Sinus pain, snoring.  Cardio: Denies chest pain, palpitations, irregular heartbeat, syncope, dyspnea, diaphoresis, orthopnea, PND, claudication, edema Respiratory: denies cough, dyspnea, DOE, pleurisy, hoarseness, laryngitis, wheezing.  Gastrointestinal: Denies dysphagia, heartburn, reflux, water brash, pain, cramps, nausea, vomiting, bloating, diarrhea, constipation, hematemesis, melena, hematochezia, jaundice, hemorrhoids Genitourinary: Denies dysuria, frequency, urgency, nocturia, hesitancy, discharge, hematuria, flank  pain Breast: Breast lumps, nipple discharge, bleeding.  Musculoskeletal: Denies arthralgia, myalgia, stiffness, Jt. Swelling, pain, limp, and strain/sprain. Denies falls. Skin: Denies puritis, rash, hives, warts, acne, eczema, changing in skin lesion Neuro: No weakness, tremor, incoordination, spasms, paresthesia, pain Psychiatric: Denies confusion, memory loss, sensory loss. Denies Depression. Endocrine: Denies change in weight, skin, hair change, nocturia, and paresthesia, diabetic polys, visual blurring, hyper / hypo glycemic episodes.  Heme/Lymph: No excessive bleeding, bruising, enlarged lymph nodes.  Physical Exam  BP 104/66      P 72     T 97.3 F     R 16     Ht 5\' 2"      Wt 153 lb      BMI 27.98   General Appearance: Well nourished and in no apparent distress. Eyes: PERRLA, EOMs, conjunctiva no swelling or erythema, normal fundi and vessels. Sinuses: No frontal/maxillary tenderness ENT/Mouth: EACs patent / TMs  nl. Nares clear without erythema, swelling, mucoid exudates. Oral hygiene is good. No erythema, swelling, or exudate. Tongue normal, non-obstructing. Tonsils not swollen or erythematous. Hearing normal.  Neck: Supple, thyroid normal. No bruits, nodes or JVD. Respiratory: Respiratory effort normal.  BS equal and clear bilateral without rales, rhonci, wheezing or stridor. Cardio: Heart sounds are normal with regular rate and rhythm and no murmurs, rubs or gallops. Peripheral pulses are normal and equal bilaterally without edema. No aortic or femoral bruits. Chest: symmetric with normal excursions and percussion. Breasts: Symmetric, without lumps, nipple discharge, retractions, or fibrocystic changes.  Abdomen: Flat, soft, with bowl sounds. Nontender, no guarding, rebound, hernias, masses, or organomegaly.  Lymphatics: Non tender without lymphadenopathy.  Genitourinary:  Musculoskeletal: Full ROM all peripheral extremities, joint stability, 5/5 strength, and normal  gait. Skin: Warm and dry without rashes, lesions, cyanosis, clubbing or  ecchymosis.  Neuro: Cranial nerves intact, reflexes equal bilaterally. Normal muscle tone, no cerebellar symptoms. Sensation intact.  Pysch: Awake and oriented X 3, normal affect, Insight and Judgment appropriate.   Assessment and Plan  1. Essential hypertension  - Microalbumin / creatinine urine ratio - EKG 12-Lead - [place], RETROPERITNL ABD,  LTD - TSH  2. Hyperlipidemia  - Lipid panel  3. Prediabetes  - Hemoglobin A1c - Insulin, fasting  4. GERD   5. Vitamin D deficiency  - Vit D  25 hydroxy   6. Depression, controlled   7. Medication management  - CBC with Differential/Platelet - BASIC METABOLIC PANEL WITH GFR - Hepatic function panel - Magnesium  8. Depression screen   9. At low risk for fall  - Urine Microscopic  10. Screening for rectal cancer  - POC Hemoccult Bld/Stl    Continue prudent diet as discussed, weight control, BP monitoring, regular exercise, and medications. Discussed med's effects and SE's. Screening labs and tests as requested with regular follow-up as recommended.

## 2014-03-09 ENCOUNTER — Encounter: Payer: Self-pay | Admitting: Physician Assistant

## 2014-03-09 ENCOUNTER — Other Ambulatory Visit: Payer: Self-pay | Admitting: Physician Assistant

## 2014-03-09 ENCOUNTER — Encounter: Payer: Self-pay | Admitting: Internal Medicine

## 2014-03-09 MED ORDER — PREDNISONE 20 MG PO TABS
ORAL_TABLET | ORAL | Status: DC
Start: 2014-03-09 — End: 2014-09-21

## 2014-03-09 MED ORDER — AZITHROMYCIN 250 MG PO TABS: ORAL_TABLET | ORAL | Status: DC

## 2014-03-20 ENCOUNTER — Other Ambulatory Visit: Payer: Self-pay | Admitting: Internal Medicine

## 2014-03-22 ENCOUNTER — Other Ambulatory Visit: Payer: Self-pay | Admitting: *Deleted

## 2014-03-22 DIAGNOSIS — Z1212 Encounter for screening for malignant neoplasm of rectum: Secondary | ICD-10-CM

## 2014-03-22 LAB — POC HEMOCCULT BLD/STL (HOME/3-CARD/SCREEN)
Card #2 Fecal Occult Blod, POC: NEGATIVE
Card #3 Fecal Occult Blood, POC: NEGATIVE
FECAL OCCULT BLD: NEGATIVE

## 2014-03-30 ENCOUNTER — Other Ambulatory Visit: Payer: Self-pay | Admitting: Physician Assistant

## 2014-04-18 ENCOUNTER — Other Ambulatory Visit: Payer: Self-pay | Admitting: Internal Medicine

## 2014-04-18 ENCOUNTER — Other Ambulatory Visit: Payer: Self-pay

## 2014-04-18 MED ORDER — BUMETANIDE 2 MG PO TABS: 2.0000 mg | ORAL_TABLET | Freq: Every day | ORAL | Status: DC

## 2014-06-21 ENCOUNTER — Ambulatory Visit (INDEPENDENT_AMBULATORY_CARE_PROVIDER_SITE_OTHER): Payer: PPO | Admitting: Physician Assistant

## 2014-06-21 ENCOUNTER — Encounter: Payer: Self-pay | Admitting: Physician Assistant

## 2014-06-21 VITALS — BP 102/68 | HR 72 | Temp 97.7°F | Resp 16 | Ht 62.0 in | Wt 150.0 lb

## 2014-06-21 DIAGNOSIS — F32A Depression, unspecified: Secondary | ICD-10-CM

## 2014-06-21 DIAGNOSIS — E2839 Other primary ovarian failure: Secondary | ICD-10-CM

## 2014-06-21 DIAGNOSIS — Z23 Encounter for immunization: Secondary | ICD-10-CM

## 2014-06-21 DIAGNOSIS — K2100 Gastro-esophageal reflux disease with esophagitis, without bleeding: Secondary | ICD-10-CM

## 2014-06-21 DIAGNOSIS — F329 Major depressive disorder, single episode, unspecified: Secondary | ICD-10-CM

## 2014-06-21 DIAGNOSIS — R7309 Other abnormal glucose: Secondary | ICD-10-CM

## 2014-06-21 DIAGNOSIS — I10 Essential (primary) hypertension: Secondary | ICD-10-CM

## 2014-06-21 DIAGNOSIS — Z9181 History of falling: Secondary | ICD-10-CM

## 2014-06-21 DIAGNOSIS — Z1331 Encounter for screening for depression: Secondary | ICD-10-CM

## 2014-06-21 DIAGNOSIS — E782 Mixed hyperlipidemia: Secondary | ICD-10-CM

## 2014-06-21 DIAGNOSIS — E559 Vitamin D deficiency, unspecified: Secondary | ICD-10-CM

## 2014-06-21 DIAGNOSIS — R7303 Prediabetes: Secondary | ICD-10-CM

## 2014-06-21 DIAGNOSIS — Z79899 Other long term (current) drug therapy: Secondary | ICD-10-CM

## 2014-06-21 DIAGNOSIS — Z6827 Body mass index (BMI) 27.0-27.9, adult: Secondary | ICD-10-CM

## 2014-06-21 DIAGNOSIS — Z0001 Encounter for general adult medical examination with abnormal findings: Secondary | ICD-10-CM

## 2014-06-21 DIAGNOSIS — R6889 Other general symptoms and signs: Secondary | ICD-10-CM

## 2014-06-21 DIAGNOSIS — Z0000 Encounter for general adult medical examination without abnormal findings: Secondary | ICD-10-CM

## 2014-06-21 LAB — CBC WITH DIFFERENTIAL/PLATELET
Basophils Absolute: 0.1 10*3/uL (ref 0.0–0.1)
Basophils Relative: 1 % (ref 0–1)
EOS ABS: 0.1 10*3/uL (ref 0.0–0.7)
Eosinophils Relative: 2 % (ref 0–5)
HCT: 39.1 % (ref 36.0–46.0)
HEMOGLOBIN: 13 g/dL (ref 12.0–15.0)
LYMPHS ABS: 1.7 10*3/uL (ref 0.7–4.0)
LYMPHS PCT: 24 % (ref 12–46)
MCH: 29.5 pg (ref 26.0–34.0)
MCHC: 33.2 g/dL (ref 30.0–36.0)
MCV: 88.7 fL (ref 78.0–100.0)
MONO ABS: 0.7 10*3/uL (ref 0.1–1.0)
MPV: 10.6 fL (ref 8.6–12.4)
Monocytes Relative: 10 % (ref 3–12)
Neutro Abs: 4.4 10*3/uL (ref 1.7–7.7)
Neutrophils Relative %: 63 % (ref 43–77)
Platelets: 360 10*3/uL (ref 150–400)
RBC: 4.41 MIL/uL (ref 3.87–5.11)
RDW: 13.7 % (ref 11.5–15.5)
WBC: 7 10*3/uL (ref 4.0–10.5)

## 2014-06-21 LAB — HEMOGLOBIN A1C
Hgb A1c MFr Bld: 5.5 % (ref <5.7)
Mean Plasma Glucose: 111 mg/dL (ref <117)

## 2014-06-21 MED ORDER — RANITIDINE HCL 300 MG PO CAPS: 300.0000 mg | ORAL_CAPSULE | Freq: Every evening | ORAL | Status: DC

## 2014-06-21 NOTE — Patient Instructions (Signed)

## 2014-06-21 NOTE — Progress Notes (Signed)
Follow up and Medicare wellness Assessment:   1. HYPERTENSION - CBC with Differential - BASIC METABOLIC PANEL WITH GFR - Hepatic function panel - TSH  2. Hyperlipidemia - Lipid panel  3. Other abnormal glucose Discussed general issues about diabetes pathophysiology and management., Educational material distributed., Suggested low cholesterol diet., Encouraged aerobic exercise., Discussed foot care., Reminded to get yearly retinal exam. - Hemoglobin A1c - Insulin, fasting  4. Unspecified vitamin D deficiency - Vit D  25 hydroxy (rtn osteoporosis monitoring)  5. Depression Reactive depression, in remission, has supportive friends/family  6.Encounter for long-term (current) use of other medications - Magnesium  7. Hypothyroidism -check TSH level, continue medications the same, reminded to take on an empty stomach 30-67mins before food.   8. Prevnar 13 DUE today   Plan:   During the course of the visit the patient was educated and counseled about appropriate screening and preventive services including:    Pneumococcal vaccine   Influenza vaccine  Td vaccine  Screening electrocardiogram  Screening mammography  Bone densitometry screening  Colorectal cancer screening  Diabetes screening  Glaucoma screening  Advanced directives: unknown  Conditions/risks identified: BMI: Discussed weight loss, diet, and increase physical activity.  Increase physical activity: AHA recommends 150 minutes of physical activity a week.  Medications reviewed DEXA- ordered Diabetes is at goal Urinary Incontinence is not an issue: discussed non pharmacology and pharmacology options.  Fall risk: low- discussed PT, home fall assessment, medications.  Subjective:   [name] is a 69 y.o. female who presents for Medicare Annual Wellness Visit and 3 month follow up on hypertension, prediabetes, hyperlipidemia, vitamin D def.  Date of last medicare wellness visit was  05/12/2013   Her blood pressure has been controlled at home, today their BP is BP: 102/68 mmHg She does workout. She denies chest pain, shortness of breath, dizziness.  She is not on cholesterol medication and denies myalgias. Her cholesterol is not at goal. The cholesterol last visit was:   Lab Results  Component Value Date   CHOL 209* 02/23/2014   HDL 51 02/23/2014   LDLCALC 128* 02/23/2014   TRIG 152* 02/23/2014   CHOLHDL 4.1 02/23/2014    Last A1C in the office was:  Lab Results  Component Value Date   HGBA1C 5.5 02/23/2014   Patient is on Vitamin D supplement. She is on thyroid medication. Her medication was not changed last visit. Patient denies heat / cold intolerance, nervousness and palpitations.  Lab Results  Component Value Date   TSH 2.075 02/23/2014  .  Husband passed Jan 24th, has been sleeping well, getting out with family/friends. Has had fatigue.  Has occ left mid back pain, positional, not with exertion, no CP, SOB, no diarrhea, constipation.  Wt Readings from Last 3 Encounters:  06/21/14 150 lb (68.04 kg)  02/23/14 153 lb (69.4 kg)  11/17/13 154 lb (69.854 kg)    Names of Other Physician/Practitioners you currently use: 1. [facility]- here for primary care 2. Dr. 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 Does not see a dentist Patient Care Team: Unk Pinto, MD as PCP - General (Internal Medicine) Lafayette Dragon, MD as Consulting Physician (Gastroenterology)  Medication Review Current Outpatient Prescriptions on File Prior to Visit  Medication Sig Dispense Refill  . ALPRAZolam (XANAX) 1 MG tablet TAKE 1/2 TO 1 TABLET 3 TIMES A DAY AS NEEDED  90 tablet 2  . aspirin 81 MG tablet Take 81 mg by mouth daily.    [name] azithromycin (ZITHROMAX) 250 MG tablet 2 tablets by mouth today then one tablet daily for 4 days. 6 tablet 1  .  bumetanide (BUMEX) 2 MG tablet Take 1 tablet (2 mg total) by mouth daily. 90 tablet 3  . Cholecalciferol (VITAMIN D PO) Take 2,000 Int'l Units by mouth 2 (two) times daily.     [name] GINKGO BILOBA PLUS PO Take 60 mg by mouth daily.    [name] levothyroxine (SYNTHROID, LEVOTHROID) 75 MCG tablet TAKE 1 TABLET EVERY DAY 90 tablet 1  . predniSONE (DELTASONE) 20 MG tablet 2 tablets daily for 3 days, 1 tablet daily for 4 days. 10 tablet 0  . ranitidine (ZANTAC) 300 MG capsule Take 300 mg by mouth as needed for heartburn.    . vitamin C (ASCORBIC ACID) 500 MG tablet Take 500 mg by mouth daily.    . Wheat Dextrin (BENEFIBER PO) Take by mouth. Takes 2 tabs per day     No current facility-administered medications on file prior to visit.    Current Problems (verified) Patient Active Problem List   Diagnosis Date Noted  . Medication management 08/15/2013  . Prediabetes 01/26/2013  . Vitamin D deficiency 12/13/2012  . GERD   . Hyperlipidemia   . Depression, controlled   . Essential hypertension 09/09/2008    Screening Tests Health Maintenance  Topic Date Due  . ZOSTAVAX  12/01/2004  . PNA vac Low Risk Adult (2 of 2 - PCV13) 01/20/2013  . INFLUENZA VACCINE  08/07/2014  . MAMMOGRAM  08/18/2015  . COLONOSCOPY  07/07/2018  . TETANUS/TDAP  01/20/2022  . DEXA SCAN  Completed     Immunization History  Administered Date(s) Administered  . Pneumococcal Polysaccharide-23 01/21/2012  . Td 01/21/2012    Preventative care: Last colonoscopy: 07/2008 Last mammogram: 08/2013 Last pap smear/pelvic exam: remote DEXA: 2010 normal DUE  Prior vaccinations: TD or Tdap: 2014  Influenza: 2015 Pneumococcal: 2014 Prevnar 13: DUE Shingles/Zostavax: declines  Allergies Allergies  Allergen Reactions  . Aspirin     REACTION: GI ulcers  . Fish Oil     Allergic to fish oil and shell fish  . Morphine And Related   . Sulfa Antibiotics   . Vicodin [Hydrocodone-Acetaminophen]    Surgical history Past  Surgical History  Procedure Laterality Date  . Back surgery    . Appendectomy    . Cesarean section    . Abdominal hysterectomy  1981    partial  . Salpingoophorectomy  1981 left    1982 right   Family history Family History  Problem Relation Age of Onset  . Diabetes Mother   . Kidney disease Mother   . Heart disease Mother   . Hypertension Father   . Heart disease Father   . Heart disease Sister    Risk Factors: Osteoporosis: postmenopausal estrogen deficiency and dietary calcium and/or vitamin D deficiency History of fracture in the past year: no  Tobacco History  Substance Use Topics  . Smoking status: Former Research scientist (life sciences)  . Smokeless tobacco: Not on file  . Alcohol Use: No   She does not smoke.  Patient is a former smoker. Are there smokers in your home (other than you)?  No  Alcohol Current alcohol use: none  Caffeine Current caffeine use: denies use  Exercise Exercise limitations: The patient has no exercise limitations. Current exercise: housecleaning and walking  Nutrition/Diet Current diet: in general,  a "healthy" diet    Cardiac risk factors: advanced age (older than 81 for men, 51 for women), dyslipidemia, hypertension, obesity (BMI >= 30 kg/m2) and sedentary lifestyle.  Depression Screen (Note: if answer to either of the following is "Yes", a more complete depression screening is indicated)   Q1: Over the past two weeks, have you felt down, depressed or hopeless? No  Q2: Over the past two weeks, have you felt little interest or pleasure in doing things? No  Have you lost interest or pleasure in daily life? No  Do you often feel hopeless? No  Do you cry easily over simple problems? No  Activities of Daily Living In your present state of health, do you have any difficulty performing the following activities?:  Driving? No Managing money?  No Feeding yourself? No Getting from bed to chair? No Climbing a flight of stairs? No Preparing food and  eating?: No Bathing or showering? No Getting dressed: No Getting to the toilet? No Using the toilet:No Moving around from place to place: No In the past year have you fallen or had a near fall?:No   Are you sexually active?  No  Do you have more than one partner?  No  Vision Difficulties: Yes  Hearing Difficulties: No Do you often ask people to speak up or repeat themselves? No Do you experience ringing or noises in your ears? Yes Do you have difficulty understanding soft or whispered voices? No  Cognition  Do you feel that you have a problem with memory?No  Do you often misplace items? No  Do you feel safe at home?  Yes  Advanced directives Does patient have a [place]? No Does patient have a Living Will? No Information given.   Objective:   Blood pressure 102/68, pulse 72, temperature 97.7 F (36.5 C), resp. rate 16, height 5\' 2"  (1.575 m), weight 150 lb (68.04 kg). Body mass index is 27.43 kg/(m^2).  General appearance: alert, no distress, WD/WN,  female Cognitive Testing  Alert? Yes  Normal Appearance?Yes  Oriented to person? Yes  Place? Yes   Time? Yes  Recall of three objects?  Yes  Can perform simple calculations? Yes  Displays appropriate judgment?Yes  Can read the correct time from a watch face?Yes  HEENT: normocephalic, sclerae anicteric, TMs pearly, nares patent, no discharge or erythema, pharynx normal Oral cavity: MMM, no lesions Neck: supple, no lymphadenopathy, no thyromegaly, no masses Heart: RRR, normal S1, S2, no murmurs Lungs: CTA bilaterally, no wheezes, rhonchi, or rales Abdomen: +bs, soft, non tender, non distended, no masses, no hepatomegaly, no splenomegaly Musculoskeletal: nontender, no swelling, no obvious deformity Extremities: no edema, no cyanosis, no clubbing Pulses: 2+ symmetric, upper and lower extremities, normal cap refill Neurological: alert, oriented x 3, CN2-12 intact, strength normal upper extremities and  lower extremities, sensation normal throughout, DTRs 2+ throughout, no cerebellar signs, gait normal Psychiatric: normal affect, behavior normal, pleasant  Breast: defer Gyn: defer Rectal: defer    Medicare Attestation I have personally reviewed: The patient's medical and social history Their use of alcohol, tobacco or illicit drugs Their current medications and supplements The patient's functional ability including ADLs,fall risks, home safety risks, cognitive, and hearing and visual impairment Diet and physical activities Evidence for depression or mood disorders  The patient's weight, height, BMI, and visual acuity have been recorded in the chart.  I have made referrals, counseling, and provided education to the patient based on review of the above and I have  provided the patient with a written personalized care plan for preventive services.     Vicie Mutters, PA-C   06/21/2014

## 2014-06-22 LAB — MAGNESIUM: Magnesium: 2.2 mg/dL (ref 1.5–2.5)

## 2014-06-22 LAB — BASIC METABOLIC PANEL WITH GFR
BUN: 16 mg/dL (ref 6–23)
CO2: 31 mEq/L (ref 19–32)
Calcium: 10.1 mg/dL (ref 8.4–10.5)
Chloride: 101 mEq/L (ref 96–112)
Creat: 0.8 mg/dL (ref 0.50–1.10)
GFR, EST AFRICAN AMERICAN: 87 mL/min
GFR, EST NON AFRICAN AMERICAN: 75 mL/min
Glucose, Bld: 94 mg/dL (ref 70–99)
Potassium: 4.1 mEq/L (ref 3.5–5.3)
Sodium: 144 mEq/L (ref 135–145)

## 2014-06-22 LAB — HEPATIC FUNCTION PANEL
ALT: 14 U/L (ref 0–35)
AST: 25 U/L (ref 0–37)
Albumin: 4.5 g/dL (ref 3.5–5.2)
Alkaline Phosphatase: 72 U/L (ref 39–117)
BILIRUBIN DIRECT: 0.1 mg/dL (ref 0.0–0.3)
Indirect Bilirubin: 0.4 mg/dL (ref 0.2–1.2)
Total Bilirubin: 0.5 mg/dL (ref 0.2–1.2)
Total Protein: 7.1 g/dL (ref 6.0–8.3)

## 2014-06-22 LAB — LIPID PANEL
Cholesterol: 208 mg/dL — ABNORMAL HIGH (ref 0–200)
HDL: 41 mg/dL — ABNORMAL LOW (ref >=46)
LDL Cholesterol: 115 mg/dL — ABNORMAL HIGH (ref 0–99)
TRIGLYCERIDES: 262 mg/dL — AB (ref <150)
Total CHOL/HDL Ratio: 5.1 Ratio
VLDL: 52 mg/dL — ABNORMAL HIGH (ref 0–40)

## 2014-06-22 LAB — VITAMIN D 25 HYDROXY (VIT D DEFICIENCY, FRACTURES): Vit D, 25-Hydroxy: 79 ng/mL (ref 30–100)

## 2014-06-22 LAB — TSH: TSH: 1.152 u[IU]/mL (ref 0.350–4.500)

## 2014-06-22 LAB — INSULIN, FASTING: INSULIN FASTING, SERUM: 18.8 u[IU]/mL (ref 2.0–19.6)

## 2014-07-19 ENCOUNTER — Other Ambulatory Visit: Payer: Self-pay | Admitting: Internal Medicine

## 2014-07-19 DIAGNOSIS — Z1231 Encounter for screening mammogram for malignant neoplasm of breast: Secondary | ICD-10-CM

## 2014-07-23 ENCOUNTER — Other Ambulatory Visit: Payer: Self-pay | Admitting: Internal Medicine

## 2014-07-24 ENCOUNTER — Other Ambulatory Visit: Payer: Self-pay | Admitting: *Deleted

## 2014-07-24 MED ORDER — BUMETANIDE 2 MG PO TABS: ORAL_TABLET | ORAL | Status: DC

## 2014-08-03 ENCOUNTER — Other Ambulatory Visit: Payer: Self-pay | Admitting: Internal Medicine

## 2014-08-15 ENCOUNTER — Ambulatory Visit
Admission: RE | Admit: 2014-08-15 | Discharge: 2014-08-15 | Disposition: A | Discharge status: Home / Self Care | Payer: PPO | Source: Ambulatory Visit | Attending: Physician Assistant | Admitting: Physician Assistant

## 2014-08-15 DIAGNOSIS — E2839 Other primary ovarian failure: Secondary | ICD-10-CM

## 2014-08-22 ENCOUNTER — Ambulatory Visit
Admission: RE | Admit: 2014-08-22 | Discharge: 2014-08-22 | Disposition: A | Discharge status: Home / Self Care | Payer: PPO | Source: Ambulatory Visit | Attending: Internal Medicine | Admitting: Internal Medicine

## 2014-08-22 DIAGNOSIS — Z1231 Encounter for screening mammogram for malignant neoplasm of breast: Secondary | ICD-10-CM

## 2014-09-21 ENCOUNTER — Encounter: Payer: Self-pay | Admitting: Internal Medicine

## 2014-09-21 ENCOUNTER — Ambulatory Visit (INDEPENDENT_AMBULATORY_CARE_PROVIDER_SITE_OTHER): Payer: PPO | Admitting: Internal Medicine

## 2014-09-21 VITALS — BP 108/64 | HR 76 | Temp 97.3°F | Resp 18 | Ht 62.0 in | Wt 151.4 lb

## 2014-09-21 DIAGNOSIS — E559 Vitamin D deficiency, unspecified: Secondary | ICD-10-CM

## 2014-09-21 DIAGNOSIS — I10 Essential (primary) hypertension: Secondary | ICD-10-CM

## 2014-09-21 DIAGNOSIS — R7309 Other abnormal glucose: Secondary | ICD-10-CM

## 2014-09-21 DIAGNOSIS — E782 Mixed hyperlipidemia: Secondary | ICD-10-CM | POA: Diagnosis not present

## 2014-09-21 DIAGNOSIS — Z6829 Body mass index (BMI) 29.0-29.9, adult: Secondary | ICD-10-CM | POA: Insufficient documentation

## 2014-09-21 DIAGNOSIS — K2100 Gastro-esophageal reflux disease with esophagitis, without bleeding: Secondary | ICD-10-CM

## 2014-09-21 DIAGNOSIS — R7303 Prediabetes: Secondary | ICD-10-CM

## 2014-09-21 DIAGNOSIS — Z0000 Encounter for general adult medical examination without abnormal findings: Secondary | ICD-10-CM | POA: Insufficient documentation

## 2014-09-21 DIAGNOSIS — Z79899 Other long term (current) drug therapy: Secondary | ICD-10-CM | POA: Diagnosis not present

## 2014-09-21 DIAGNOSIS — Z6827 Body mass index (BMI) 27.0-27.9, adult: Secondary | ICD-10-CM

## 2014-09-21 DIAGNOSIS — E663 Overweight: Secondary | ICD-10-CM

## 2014-09-21 LAB — HEPATIC FUNCTION PANEL
ALK PHOS: 66 U/L (ref 33–130)
ALT: 20 U/L (ref 6–29)
AST: 28 U/L (ref 10–35)
Albumin: 4.7 g/dL (ref 3.6–5.1)
BILIRUBIN DIRECT: 0.1 mg/dL (ref <=0.2)
BILIRUBIN INDIRECT: 0.5 mg/dL (ref 0.2–1.2)
TOTAL PROTEIN: 7.4 g/dL (ref 6.1–8.1)
Total Bilirubin: 0.6 mg/dL (ref 0.2–1.2)

## 2014-09-21 LAB — CBC WITH DIFFERENTIAL/PLATELET
Basophils Absolute: 0.1 10*3/uL (ref 0.0–0.1)
Basophils Relative: 1 % (ref 0–1)
Eosinophils Absolute: 0.1 10*3/uL (ref 0.0–0.7)
Eosinophils Relative: 2 % (ref 0–5)
HEMATOCRIT: 39.4 % (ref 36.0–46.0)
HEMOGLOBIN: 13.2 g/dL (ref 12.0–15.0)
LYMPHS PCT: 22 % (ref 12–46)
Lymphs Abs: 1.4 10*3/uL (ref 0.7–4.0)
MCH: 29.5 pg (ref 26.0–34.0)
MCHC: 33.5 g/dL (ref 30.0–36.0)
MCV: 87.9 fL (ref 78.0–100.0)
MONO ABS: 0.6 10*3/uL (ref 0.1–1.0)
MPV: 10.7 fL (ref 8.6–12.4)
Monocytes Relative: 10 % (ref 3–12)
NEUTROS ABS: 4.2 10*3/uL (ref 1.7–7.7)
NEUTROS PCT: 65 % (ref 43–77)
Platelets: 351 10*3/uL (ref 150–400)
RBC: 4.48 MIL/uL (ref 3.87–5.11)
RDW: 13.8 % (ref 11.5–15.5)
WBC: 6.4 10*3/uL (ref 4.0–10.5)

## 2014-09-21 LAB — LIPID PANEL
CHOL/HDL RATIO: 5.3 ratio — AB (ref <=5.0)
Cholesterol: 237 mg/dL — ABNORMAL HIGH (ref 125–200)
HDL: 45 mg/dL — ABNORMAL LOW (ref >=46)
LDL CALC: 153 mg/dL — AB (ref <130)
Triglycerides: 197 mg/dL — ABNORMAL HIGH (ref <150)
VLDL: 39 mg/dL — AB (ref <30)

## 2014-09-21 LAB — BASIC METABOLIC PANEL WITH GFR
BUN: 15 mg/dL (ref 7–25)
CALCIUM: 10.1 mg/dL (ref 8.6–10.4)
CHLORIDE: 99 mmol/L (ref 98–110)
CO2: 32 mmol/L — AB (ref 20–31)
Creat: 0.67 mg/dL (ref 0.50–0.99)
GFR, Est African American: >89 mL/min (ref >=60)
GLUCOSE: 85 mg/dL (ref 65–99)
POTASSIUM: 4.3 mmol/L (ref 3.5–5.3)
SODIUM: 143 mmol/L (ref 135–146)

## 2014-09-21 LAB — MAGNESIUM: Magnesium: 2.1 mg/dL (ref 1.5–2.5)

## 2014-09-22 ENCOUNTER — Other Ambulatory Visit: Payer: Self-pay | Admitting: Internal Medicine

## 2014-09-22 DIAGNOSIS — E782 Mixed hyperlipidemia: Secondary | ICD-10-CM

## 2014-09-22 LAB — INSULIN, RANDOM: Insulin: 2.3 u[IU]/mL (ref 2.0–19.6)

## 2014-09-22 LAB — HEMOGLOBIN A1C
HEMOGLOBIN A1C: 5.6 % (ref <5.7)
MEAN PLASMA GLUCOSE: 114 mg/dL (ref <117)

## 2014-09-22 LAB — TSH: TSH: 1.913 u[IU]/mL (ref 0.350–4.500)

## 2014-09-22 LAB — VITAMIN D 25 HYDROXY (VIT D DEFICIENCY, FRACTURES): Vit D, 25-Hydroxy: 67 ng/mL (ref 30–100)

## 2014-09-22 MED ORDER — ATORVASTATIN CALCIUM 80 MG PO TABS: ORAL_TABLET | ORAL | Status: DC

## 2014-09-23 ENCOUNTER — Encounter: Payer: Self-pay | Admitting: Internal Medicine

## 2014-09-23 NOTE — Progress Notes (Signed)
Patient ID: [name], female   DOB: 07-24-1945, 69 y.o.   MRN: [id number]   This very nice 69 y.o. WWF presents for 3 month follow up with Hypertension, Hyperlipidemia, Pre-Diabetes and Vitamin D Deficiency.    Patient is treated for HTN since 1995 & BP has been controlled at home. Today's BP: 108/64 mmHg. Patient has had no complaints of any cardiac type chest pain, palpitations, dyspnea/orthopnea/PND, dizziness, claudication, or dependent edema.   Hyperlipidemia is not controlled with diet. Patient has not been compliant with diet. Today's Lipids are not at goal - Cholesterol 237*; HDL 45*; LDL 153*; Triglycerides 197 and Rx for Atorvastatin will be sent in.   Also, the patient has history of PreDiabetes since 2011 with A1c 5.7% and has had no symptoms of reactive hypoglycemia, diabetic polys, paresthesias or visual blurring.  Last A1c was  5.6% on 09/21/2014.   Further, the patient also has history of Vitamin D Deficiency of 40 in 2008 and supplements vitamin D without any suspected side-effects. Today's vitamin D is  67.      Medication Sig  . ALPRAZolam  1 MG  TAKE 1/2-1 TABLET THREE TIMES A DAY AS NEEDED  . aspirin 81 MG Take  daily.  . bumetanide2 MG  TAKE 1 TAB EVERY DAY  . VITAMIN D Take 2,000  Units  2  times daily.   [name] GINKGO BILOBA PLUS  Take 60 mg daily.  [name] levothyroxine  75 MCG TAKE 1 TAB EVERY DAY  . ranitidine 300 MG  Take 1 cap every evening.  . vitamin C 500 MG Take  daily.  . Wheat Dextrin (BENEFIBER) Takes 2 tabs per day   Allergies  Allergen Reactions  . Aspirin     REACTION: GI ulcers  . Fish Oil     Allergic to fish oil and shell fish  . Morphine And Related   . Sulfa Antibiotics   . Vicodin [Hydrocodone-Acetaminophen]    PMHx:   Past Medical History  Diagnosis Date  . Vitamin D deficiency    Immunization History  Administered Date(s) Administered  . Pneumococcal Conjugate-13 06/21/2014  . Pneumococcal Polysaccharide-23 01/21/2012  . Td  01/21/2012   Past Surgical History  Procedure Laterality Date  . Back surgery    . Appendectomy    . Cesarean section    . Abdominal hysterectomy  1981    partial  . Salpingoophorectomy  1981 left    1982 right   FHx:    Reviewed / unchanged  SHx:    Reviewed / unchanged  Systems Review:  Constitutional: Denies fever, chills, wt changes, headaches, insomnia, fatigue, night sweats, change in appetite. Eyes: Denies redness, blurred vision, diplopia, discharge, itchy, watery eyes.  ENT: Denies discharge, congestion, post nasal drip, epistaxis, sore throat, earache, hearing loss, dental pain, tinnitus, vertigo, sinus pain, snoring.  CV: Denies chest pain, palpitations, irregular heartbeat, syncope, dyspnea, diaphoresis, orthopnea, PND, claudication or edema. Respiratory: denies cough, dyspnea, DOE, pleurisy, hoarseness, laryngitis, wheezing.  Gastrointestinal: Denies dysphagia, odynophagia, heartburn, reflux, water brash, abdominal pain or cramps, nausea, vomiting, bloating, diarrhea, constipation, hematemesis, melena, hematochezia  or hemorrhoids. Genitourinary: Denies dysuria, frequency, urgency, nocturia, hesitancy, discharge, hematuria or flank pain. Musculoskeletal: Denies arthralgias, myalgias, stiffness, jt. swelling, pain, limping or strain/sprain.  Skin: Denies pruritus, rash, hives, warts, acne, eczema or change in skin lesion(s). Neuro: No weakness, tremor, incoordination, spasms, paresthesia or pain. Psychiatric: Denies confusion, memory loss or sensory loss. Endo: Denies change in weight, skin or hair change.  Heme/Lymph: No excessive bleeding, bruising or enlarged lymph nodes.  Physical Exam  BP 108/64 mmHg  Pulse 76  Temp(Src) 97.3 F (36.3 C)  Resp 18  Ht 5\' 2"  (1.575 m)  Wt 151 lb 6.4 oz (68.675 kg)  BMI 27.68 kg/m2  Appears well nourished and in no distress. Eyes: PERRLA, EOMs, conjunctiva no swelling or erythema. Sinuses: No frontal/maxillary  tenderness ENT/Mouth: EAC's clear, TM's nl w/o erythema, bulging. Nares clear w/o erythema, swelling, exudates. Oropharynx clear without erythema or exudates. Oral hygiene is good. Tongue normal, non obstructing. Hearing intact.  Neck: Supple. Thyroid nl. Car 2+/2+ without bruits, nodes or JVD. Chest: Respirations nl with BS clear & equal w/o rales, rhonchi, wheezing or stridor.  Cor: Heart sounds normal w/ regular rate and rhythm without sig. murmurs, gallops, clicks, or rubs. Peripheral pulses normal and equal  without edema.  Abdomen: Soft & bowel sounds normal. Non-tender w/o guarding, rebound, hernias, masses, or organomegaly.  Lymphatics: Unremarkable.  Musculoskeletal: Full ROM all peripheral extremities, joint stability, 5/5 strength, and normal gait.  Skin: Warm, dry without exposed rashes, lesions or ecchymosis apparent.  Neuro: Cranial nerves intact, reflexes equal bilaterally. Sensory-motor testing grossly intact. Tendon reflexes grossly intact.  Pysch: Alert & oriented x 3.  Insight and judgement nl & appropriate. No ideations.  Assessment and Plan:  1. Essential hypertension  - TSH  2. Hyperlipidemia  - Lipid panel  3. Prediabetes  - Hemoglobin A1c - Insulin, random  4. Vitamin D deficiency  - Vit D  25 hydroxy   5. GERD   6. BMI 27.0-27.9,adult   7. Medication management  - CBC with Differential/Platelet - BASIC METABOLIC PANEL WITH GFR - Hepatic function panel - Magnesium   Recommended regular exercise, BP monitoring, weight control, and discussed med and SE's. Recommended labs to assess and monitor clinical status. Further disposition pending results of labs. Over 30 minutes of exam, counseling, chart review was performed

## 2014-09-27 ENCOUNTER — Ambulatory Visit: Payer: Self-pay | Admitting: Internal Medicine

## 2014-10-18 ENCOUNTER — Ambulatory Visit: Payer: PPO

## 2014-10-18 DIAGNOSIS — Z23 Encounter for immunization: Secondary | ICD-10-CM

## 2014-11-05 ENCOUNTER — Other Ambulatory Visit: Payer: Self-pay | Admitting: Internal Medicine

## 2014-11-05 DIAGNOSIS — E039 Hypothyroidism, unspecified: Secondary | ICD-10-CM

## 2015-01-02 ENCOUNTER — Ambulatory Visit: Payer: Self-pay | Admitting: Physician Assistant

## 2015-01-15 ENCOUNTER — Other Ambulatory Visit: Payer: Self-pay | Admitting: Internal Medicine

## 2015-01-15 ENCOUNTER — Ambulatory Visit: Payer: Self-pay | Admitting: Physician Assistant

## 2015-01-15 DIAGNOSIS — I10 Essential (primary) hypertension: Secondary | ICD-10-CM

## 2015-01-15 MED ORDER — FUROSEMIDE 40 MG PO TABS: ORAL_TABLET | ORAL | Status: DC

## 2015-01-19 ENCOUNTER — Encounter: Payer: Self-pay | Admitting: Physician Assistant

## 2015-01-19 ENCOUNTER — Ambulatory Visit (INDEPENDENT_AMBULATORY_CARE_PROVIDER_SITE_OTHER): Payer: PPO | Admitting: Physician Assistant

## 2015-01-19 VITALS — BP 110/60 | HR 62 | Temp 97.3°F | Resp 16 | Ht 62.0 in | Wt 150.0 lb

## 2015-01-19 DIAGNOSIS — F325 Major depressive disorder, single episode, in full remission: Secondary | ICD-10-CM | POA: Diagnosis not present

## 2015-01-19 DIAGNOSIS — Z79899 Other long term (current) drug therapy: Secondary | ICD-10-CM | POA: Diagnosis not present

## 2015-01-19 DIAGNOSIS — E559 Vitamin D deficiency, unspecified: Secondary | ICD-10-CM

## 2015-01-19 DIAGNOSIS — Z0001 Encounter for general adult medical examination with abnormal findings: Secondary | ICD-10-CM | POA: Diagnosis not present

## 2015-01-19 DIAGNOSIS — E782 Mixed hyperlipidemia: Secondary | ICD-10-CM | POA: Diagnosis not present

## 2015-01-19 DIAGNOSIS — I10 Essential (primary) hypertension: Secondary | ICD-10-CM

## 2015-01-19 DIAGNOSIS — R6889 Other general symptoms and signs: Secondary | ICD-10-CM

## 2015-01-19 DIAGNOSIS — R7309 Other abnormal glucose: Secondary | ICD-10-CM | POA: Diagnosis not present

## 2015-01-19 DIAGNOSIS — K2100 Gastro-esophageal reflux disease with esophagitis, without bleeding: Secondary | ICD-10-CM

## 2015-01-19 DIAGNOSIS — M791 Myalgia, unspecified site: Secondary | ICD-10-CM

## 2015-01-19 DIAGNOSIS — Z0000 Encounter for general adult medical examination without abnormal findings: Secondary | ICD-10-CM

## 2015-01-19 LAB — HEPATIC FUNCTION PANEL
ALT: 17 U/L (ref 6–29)
AST: 27 U/L (ref 10–35)
Albumin: 4.7 g/dL (ref 3.6–5.1)
Alkaline Phosphatase: 71 U/L (ref 33–130)
BILIRUBIN DIRECT: 0.1 mg/dL (ref <=0.2)
Indirect Bilirubin: 0.4 mg/dL (ref 0.2–1.2)
TOTAL PROTEIN: 7.1 g/dL (ref 6.1–8.1)
Total Bilirubin: 0.5 mg/dL (ref 0.2–1.2)

## 2015-01-19 LAB — CBC WITH DIFFERENTIAL/PLATELET
BASOS ABS: 0.1 10*3/uL (ref 0.0–0.1)
Basophils Relative: 1 % (ref 0–1)
EOS ABS: 0.2 10*3/uL (ref 0.0–0.7)
Eosinophils Relative: 4 % (ref 0–5)
HEMATOCRIT: 40 % (ref 36.0–46.0)
HEMOGLOBIN: 13.4 g/dL (ref 12.0–15.0)
LYMPHS ABS: 1.2 10*3/uL (ref 0.7–4.0)
LYMPHS PCT: 24 % (ref 12–46)
MCH: 30 pg (ref 26.0–34.0)
MCHC: 33.5 g/dL (ref 30.0–36.0)
MCV: 89.7 fL (ref 78.0–100.0)
MONOS PCT: 8 % (ref 3–12)
MPV: 10.8 fL (ref 8.6–12.4)
Monocytes Absolute: 0.4 10*3/uL (ref 0.1–1.0)
NEUTROS PCT: 63 % (ref 43–77)
Neutro Abs: 3.2 10*3/uL (ref 1.7–7.7)
Platelets: 310 10*3/uL (ref 150–400)
RBC: 4.46 MIL/uL (ref 3.87–5.11)
RDW: 13.4 % (ref 11.5–15.5)
WBC: 5.1 10*3/uL (ref 4.0–10.5)

## 2015-01-19 LAB — CK: CK TOTAL: 63 U/L (ref 7–177)

## 2015-01-19 LAB — MAGNESIUM: MAGNESIUM: 2.1 mg/dL (ref 1.5–2.5)

## 2015-01-19 LAB — BASIC METABOLIC PANEL WITH GFR
BUN: 17 mg/dL (ref 7–25)
CALCIUM: 10 mg/dL (ref 8.6–10.4)
CHLORIDE: 97 mmol/L — AB (ref 98–110)
CO2: 33 mmol/L — AB (ref 20–31)
CREATININE: 0.71 mg/dL (ref 0.60–0.93)
GFR, Est African American: >89 mL/min (ref >=60)
GFR, Est Non African American: 87 mL/min (ref >=60)
GLUCOSE: 70 mg/dL (ref 65–99)
Potassium: 4.6 mmol/L (ref 3.5–5.3)
Sodium: 140 mmol/L (ref 135–146)

## 2015-01-19 LAB — LIPID PANEL
CHOL/HDL RATIO: 4 ratio (ref <=5.0)
CHOLESTEROL: 190 mg/dL (ref 125–200)
HDL: 47 mg/dL (ref >=46)
LDL Cholesterol: 116 mg/dL (ref <130)
Triglycerides: 134 mg/dL (ref <150)
VLDL: 27 mg/dL (ref <30)

## 2015-01-19 LAB — HEMOGLOBIN A1C
HEMOGLOBIN A1C: 5.5 % (ref <5.7)
MEAN PLASMA GLUCOSE: 111 mg/dL (ref <117)

## 2015-01-19 LAB — TSH: TSH: 1.778 u[IU]/mL (ref 0.350–4.500)

## 2015-01-19 NOTE — Patient Instructions (Signed)
Muscle aches Stop the statin for 1 week and then start back after we get the lab back. We are getting a lab that will look for muscle break down with the statin.  We will check your potassium, magnesium to see if these are low which can cause muscle aches.  Please make sure you are taking 64 oz of water a day as long as you do now have a heart condition.  -Please take Tylenol or Aleve for pain. -You can take tylenol (566m) or tylenol arthritis (6568m. The max you can take of tylenol a day is 300080maily, this is a max of 6 pills a day of the regular tyelnol (500m11mr a max of 4 a day of the tylenol arthritis (650mg76m long as no other medications you are taking contain tylenol.  -Aleve/Naproxen Sodium- can take 1-2 in the morning and 1 at night. Max 3 a day. Take with food to avoid ulcers. Does affect your kidney function so use sparingly.    Muscle Pain Muscle pain (myalgia) may be caused by many things, including:  Overuse or muscle strain, especially if you are not in shape. This is the most common cause of muscle pain.  Injury.  Bruises.  Viruses, such as the flu.  Infectious diseases.  Fibromyalgia, which is a chronic condition that causes muscle tenderness, fatigue, and headache.  Autoimmune diseases, including lupus.  Certain drugs, including ACE inhibitors and statins. Muscle pain may be mild or severe. In most cases, the pain lasts only a short time and goes away without treatment. To diagnose the cause of your muscle pain, your health care provider will take your medical history. This means he or she will ask you when your muscle pain began and what has been happening. If you have not had muscle pain for very long, your health care provider may want to wait before doing much testing. If your muscle pain has lasted a long time, your health care provider may want to run tests right away. If your health care provider thinks your muscle pain may be caused by illness, you may  need to have additional tests to rule out certain conditions.  Treatment for muscle pain depends on the cause. Home care is often enough to relieve muscle pain. Your health care provider may also prescribe anti-inflammatory medicine. HOME CARE INSTRUCTIONS Watch your condition for any changes. The following actions may help to lessen any discomfort you are feeling:  Only take over-the-counter or prescription medicines as directed by your health care provider.  Apply ice to the sore muscle:  Put ice in a plastic bag.  Place a towel between your skin and the bag.  Leave the ice on for 15-20 minutes, 3-4 times a day.  You may alternate applying hot and cold packs to the muscle as directed by your health care provider.  If overuse is causing your muscle pain, slow down your activities until the pain goes away.  Remember that it is normal to feel some muscle pain after starting a workout program. Muscles that have not been used often will be sore at first.  Do regular, gentle exercises if you are not usually active.  Warm up before exercising to lower your risk of muscle pain.  Do not continue working out if the pain is very bad. Bad pain could mean you have injured a muscle. SEEK MEDICAL CARE IF:  Your muscle pain gets worse, and medicines do not help.  You have muscle pain that  lasts longer than 3 days.  You have a rash or fever along with muscle pain.  You have muscle pain after a tick bite.  You have muscle pain while working out, even though you are in good physical condition.  You have redness, soreness, or swelling along with muscle pain.  You have muscle pain after starting a new medicine or changing the dose of a medicine. SEEK IMMEDIATE MEDICAL CARE IF:  You have trouble breathing.  You have trouble swallowing.  You have muscle pain along with a stiff neck, fever, and vomiting.  You have severe muscle weakness or cannot move part of your body. MAKE SURE YOU:    Understand these instructions.  Will watch your condition.  Will get help right away if you are not doing well or get worse. Document Released: 11/14/2005 Document Revised: 12/28/2012 Document Reviewed: 10/19/2012 [facility] Patient Information 2015 [place], [place]. This information is not intended to replace advice given to you by your health care provider. Make sure you discuss any questions you have with your health care provider.  Preventive Care for Adults A healthy lifestyle and preventive care can promote health and wellness. Preventive health guidelines for women include the following key practices.  A routine yearly physical is a good way to check with your health care provider about your health and preventive screening. It is a chance to share any concerns and updates on your health and to receive a thorough exam.  Visit your dentist for a routine exam and preventive care every 6 months. Brush your teeth twice a day and floss once a day. Good oral hygiene prevents tooth decay and gum disease.  The frequency of eye exams is based on your age, health, family medical history, use of contact lenses, and other factors. Follow your health care provider's recommendations for frequency of eye exams.  Eat a healthy diet. Foods like vegetables, fruits, whole grains, low-fat dairy products, and lean protein foods contain the nutrients you need without too many calories. Decrease your intake of foods high in solid fats, added sugars, and salt. Eat the right amount of calories for you.Get information about a proper diet from your health care provider, if necessary.  Regular physical exercise is one of the most important things you can do for your health. Most adults should get at least 150 minutes of moderate-intensity exercise (any activity that increases your heart rate and causes you to sweat) each week. In addition, most adults need muscle-strengthening exercises on 2 or more days a  week.  Maintain a healthy weight. The body mass index (BMI) is a screening tool to identify possible weight problems. It provides an estimate of body fat based on height and weight. Your health care provider can find your BMI and can help you achieve or maintain a healthy weight.For adults 20 years and older:  A BMI below 18.5 is considered underweight.  A BMI of 18.5 to 24.9 is normal.  A BMI of 25 to 29.9 is considered overweight.  A BMI of 30 and above is considered obese.  Maintain normal blood lipids and cholesterol levels by exercising and minimizing your intake of saturated fat. Eat a balanced diet with plenty of fruit and vegetables. If your lipid or cholesterol levels are high, you are over 50, or you are at high risk for heart disease, you may need your cholesterol levels checked more frequently.Ongoing high lipid and cholesterol levels should be treated with medicines if diet and exercise are not working.  If you smoke, find out from your health care provider how to quit. If you do not use tobacco, do not start.  Lung cancer screening is recommended for adults aged 47-80 years who are at high risk for developing lung cancer because of a history of smoking. A yearly low-dose CT scan of the lungs is recommended for people who have at least a 30-pack-year history of smoking and are a current smoker or have quit within the past 15 years. A pack year of smoking is smoking an average of 1 pack of cigarettes a day for 1 year (for example: 1 pack a day for 30 years or 2 packs a day for 15 years). Yearly screening should continue until the smoker has stopped smoking for at least 15 years. Yearly screening should be stopped for people who develop a health problem that would prevent them from having lung cancer treatment.  Avoid use of street drugs. Do not share needles with anyone. Ask for help if you need support or instructions about stopping the use of drugs.  High blood pressure causes  heart disease and increases the risk of stroke.  Ongoing high blood pressure should be treated with medicines if weight loss and exercise do not work.  If you are 58-26 years old, ask your health care provider if you should take aspirin to prevent strokes.  Diabetes screening involves taking a blood sample to check your fasting blood sugar level. This should be done once every 3 years, after age 43, if you are within normal weight and without risk factors for diabetes. Testing should be considered at a younger age or be carried out more frequently if you are overweight and have at least 1 risk factor for diabetes.  Breast cancer screening is essential preventive care for women. You should practice "breast self-awareness." This means understanding the normal appearance and feel of your breasts and may include breast self-examination. Any changes detected, no matter how small, should be reported to a health care provider. Women in their 17s and 30s should have a clinical breast exam (CBE) by a health care provider as part of a regular health exam every 1 to 3 years. After age 60, women should have a CBE every year. Starting at age 1, women should consider having a mammogram (breast X-ray test) every year. Women who have a family history of breast cancer should talk to their health care provider about genetic screening. Women at a high risk of breast cancer should talk to their health care providers about having an MRI and a mammogram every year.  Breast cancer gene (BRCA)-related cancer risk assessment is recommended for women who have family members with BRCA-related cancers. BRCA-related cancers include breast, ovarian, tubal, and peritoneal cancers. Having family members with these cancers may be associated with an increased risk for harmful changes (mutations) in the breast cancer genes BRCA1 and BRCA2. Results of the assessment will determine the need for genetic counseling and BRCA1 and BRCA2  testing.  Routine pelvic exams to screen for cancer are no longer recommended for nonpregnant women who are considered low risk for cancer of the pelvic organs (ovaries, uterus, and vagina) and who do not have symptoms. Ask your health care provider if a screening pelvic exam is right for you.  If you have had past treatment for cervical cancer or a condition that could lead to cancer, you need Pap tests and screening for cancer for at least 20 years after your treatment. If Pap tests have  been discontinued, your risk factors (such as having a new sexual partner) need to be reassessed to determine if screening should be resumed. Some women have medical problems that increase the chance of getting cervical cancer. In these cases, your health care provider may recommend more frequent screening and Pap tests.    Colorectal cancer can be detected and often prevented. Most routine colorectal cancer screening begins at the age of 20 years and continues through age 65 years. However, your health care provider may recommend screening at an earlier age if you have risk factors for colon cancer. On a yearly basis, your health care provider may provide home test kits to check for hidden blood in the stool. Use of a small camera at the end of a tube, to directly examine the colon (sigmoidoscopy or colonoscopy), can detect the earliest forms of colorectal cancer. Talk to your health care provider about this at age 45, when routine screening begins. Direct exam of the colon should be repeated every 5-10 years through age 37 years, unless early forms of pre-cancerous polyps or small growths are found.  Osteoporosis is a disease in which the bones lose minerals and strength with aging. This can result in serious bone fractures or breaks. The risk of osteoporosis can be identified using a bone density scan. Women ages 8 years and over and women at risk for fractures or osteoporosis should discuss screening with their  health care providers. Ask your health care provider whether you should take a calcium supplement or vitamin D to reduce the rate of osteoporosis.  Menopause can be associated with physical symptoms and risks. Hormone replacement therapy is available to decrease symptoms and risks. You should talk to your health care provider about whether hormone replacement therapy is right for you.  Use sunscreen. Apply sunscreen liberally and repeatedly throughout the day. You should seek shade when your shadow is shorter than you. Protect yourself by wearing long sleeves, pants, a wide-brimmed hat, and sunglasses year round, whenever you are outdoors.  Once a month, do a whole body skin exam, using a mirror to look at the skin on your back. Tell your health care provider of new moles, moles that have irregular borders, moles that are larger than a pencil eraser, or moles that have changed in shape or color.  Stay current with required vaccines (immunizations).  Influenza vaccine. All adults should be immunized every year.  Tetanus, diphtheria, and acellular pertussis (Td, Tdap) vaccine. Pregnant women should receive 1 dose of Tdap vaccine during each pregnancy. The dose should be obtained regardless of the length of time since the last dose. Immunization is preferred during the 27th-36th week of gestation. An adult who has not previously received Tdap or who does not know her vaccine status should receive 1 dose of Tdap. This initial dose should be followed by tetanus and diphtheria toxoids (Td) booster doses every 10 years. Adults with an unknown or incomplete history of completing a 3-dose immunization series with Td-containing vaccines should begin or complete a primary immunization series including a Tdap dose. Adults should receive a Td booster every 10 years.    Zoster vaccine. One dose is recommended for adults aged 34 years or older unless certain conditions are present.    Pneumococcal 13-valent  conjugate (PCV13) vaccine. When indicated, a person who is uncertain of her immunization history and has no record of immunization should receive the PCV13 vaccine. An adult aged 37 years or older who has certain medical conditions and  has not been previously immunized should receive 1 dose of PCV13 vaccine. This PCV13 should be followed with a dose of pneumococcal polysaccharide (PPSV23) vaccine. The PPSV23 vaccine dose should be obtained at least 8 weeks after the dose of PCV13 vaccine. An adult aged 27 years or older who has certain medical conditions and previously received 1 or more doses of PPSV23 vaccine should receive 1 dose of PCV13. The PCV13 vaccine dose should be obtained 1 or more years after the last PPSV23 vaccine dose.    Pneumococcal polysaccharide (PPSV23) vaccine. When PCV13 is also indicated, PCV13 should be obtained first. All adults aged 72 years and older should be immunized. An adult younger than age 38 years who has certain medical conditions should be immunized. Any person who resides in a nursing home or long-term care facility should be immunized. An adult smoker should be immunized. People with an immunocompromised condition and certain other conditions should receive both PCV13 and PPSV23 vaccines. People with human immunodeficiency virus (HIV) infection should be immunized as soon as possible after diagnosis. Immunization during chemotherapy or radiation therapy should be avoided. Routine use of PPSV23 vaccine is not recommended for American Indians, [place], or people younger than 65 years unless there are medical conditions that require PPSV23 vaccine. When indicated, people who have unknown immunization and have no record of immunization should receive PPSV23 vaccine. One-time revaccination 5 years after the first dose of PPSV23 is recommended for people aged 19-64 years who have chronic kidney failure, nephrotic syndrome, asplenia, or immunocompromised conditions. People  who received 1-2 doses of PPSV23 before age 84 years should receive another dose of PPSV23 vaccine at age 36 years or later if at least 5 years have passed since the previous dose. Doses of PPSV23 are not needed for people immunized with PPSV23 at or after age 68 years.   Preventive Services / Frequency  Ages 85 years and over  Blood pressure check.  Lipid and cholesterol check.  Lung cancer screening. / Every year if you are aged 70-80 years and have a 30-pack-year history of smoking and currently smoke or have quit within the past 15 years. Yearly screening is stopped once you have quit smoking for at least 15 years or develop a health problem that would prevent you from having lung cancer treatment.  Clinical breast exam.** / Every year after age 72 years.  BRCA-related cancer risk assessment.** / For women who have family members with a BRCA-related cancer (breast, ovarian, tubal, or peritoneal cancers).  Mammogram.** / Every year beginning at age 91 years and continuing for as long as you are in good health. Consult with your health care provider.  Pap test.** / Every 3 years starting at age 19 years through age 48 or 106 years with 3 consecutive normal Pap tests. Testing can be stopped between 65 and 70 years with 3 consecutive normal Pap tests and no abnormal Pap or HPV tests in the past 10 years.  Fecal occult blood test (FOBT) of stool. / Every year beginning at age 60 years and continuing until age 47 years. You may not need to do this test if you get a colonoscopy every 10 years.  Flexible sigmoidoscopy or colonoscopy.** / Every 5 years for a flexible sigmoidoscopy or every 10 years for a colonoscopy beginning at age 59 years and continuing until age 48 years.  Hepatitis C blood test.** / For all people born from [birth year] through 1966 and any individual with known risks for hepatitis C.  Osteoporosis screening.** / A one-time screening for women ages 15 years and over and women at  risk for fractures or osteoporosis.  Skin self-exam. / Monthly.  Influenza vaccine. / Every year.  Tetanus, diphtheria, and acellular pertussis (Tdap/Td) vaccine.** / 1 dose of Td every 10 years.  Zoster vaccine.** / 1 dose for adults aged 13 years or older.  Pneumococcal 13-valent conjugate (PCV13) vaccine.** / Consult your health care provider.  Pneumococcal polysaccharide (PPSV23) vaccine.** / 1 dose for all adults aged 57 years and older. Screening for abdominal aortic aneurysm (AAA)  by ultrasound is recommended for people who have history of high blood pressure or who are current or former smokers.

## 2015-01-19 NOTE — Progress Notes (Signed)
Follow up and Medicare wellness Assessment:   1. HYPERTENSION - CBC with Differential - BASIC METABOLIC PANEL WITH GFR - Hepatic function panel - TSH  2. Hyperlipidemia - Lipid panel  3. Other abnormal glucose Discussed general issues about diabetes pathophysiology and management., Educational material distributed., Suggested low cholesterol diet., Encouraged aerobic exercise., Discussed foot care., Reminded to get yearly retinal exam. - Hemoglobin A1c - Insulin, fasting  4. Unspecified vitamin D deficiency - Vit D  25 hydroxy (rtn osteoporosis monitoring)  5. 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 Hypothyroidism -check TSH level, continue medications the same, reminded to take on an empty stomach 30-56mins before food.   8. Myalgias - stop statin, take NSAIDS PRN, increase fluids, will check CPK, TSH, magnesium, potassium, denies recent tick exposure    Plan:   During the course of the visit the patient was educated and counseled about appropriate screening and preventive services including:    Pneumococcal vaccine   Influenza vaccine  Td vaccine  Screening electrocardiogram  Screening mammography  Bone densitometry screening  Colorectal cancer screening  Diabetes screening  Glaucoma screening  Advanced directives: unknown  Conditions/risks identified: BMI: Discussed weight loss, diet, and increase physical activity.  Increase physical activity: AHA recommends 150 minutes of physical activity a week.  Medications reviewed Urinary Incontinence is not an issue: discussed non pharmacology and pharmacology options.  Fall risk: low- discussed PT, home fall assessment, medications.  Subjective:   [name] is a 70 y.o. female who presents for Medicare Annual Wellness Visit and 3 month follow up on hypertension, prediabetes, hyperlipidemia,  vitamin D def.  Date of last medicare wellness visit was 05/2014  Her blood pressure has been controlled at home, today their BP is BP: 110/60 mmHg She does workout, going to senior center. She denies chest pain, shortness of breath, dizziness.  She is on cholesterol medication, she is on lipitor she is on 1/4 T,T,S and she is having muscle aches at nigh . Her cholesterol is not at goal. The cholesterol last visit was:   Lab Results  Component Value Date   CHOL 237* 09/21/2014   HDL 45* 09/21/2014   LDLCALC 153* 09/21/2014   TRIG 197* 09/21/2014   CHOLHDL 5.3* 09/21/2014    Last A1C in the office was:  Lab Results  Component Value Date   HGBA1C 5.6 09/21/2014   Patient is on Vitamin D supplement. She is on thyroid medication. Her medication was not changed last visit. Patient denies heat / cold intolerance, nervousness and palpitations.  Lab Results  Component Value Date   TSH 1.913 09/21/2014  .  Husband passed Jan 24th a year ago, will occ have trouble sleeping, getting out with family/friends. Will take xanax 1.5 for sleep.  Has occ left mid back pain, positional, not with exertion, no CP, SOB, no diarrhea, constipation. Body mass index is 27.43 kg/(m^2). Wt Readings from Last 3 Encounters:  01/19/15 150 lb (68.04 kg)  09/21/14 151 lb 6.4 oz (68.675 kg)  06/21/14 150 lb (68.04 kg)    Names of Other Physician/Practitioners you currently use: 1. [facility]- here for primary care 2. Dr. 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 Does not see a dentist Patient Care Team: Unk Pinto, MD as PCP - General (Internal Medicine) Lafayette Dragon, MD as Consulting Physician (Gastroenterology)  Medication Review Current Outpatient Prescriptions on File Prior to Visit  Medication Sig Dispense Refill  . ALPRAZolam (XANAX) 1 MG tablet TAKE 1/2-1 TABLET THREE TIMES A DAY AS NEEDED 90  tablet 2  . aspirin 81 MG tablet Take 81 mg by mouth daily.    [name] atorvastatin (LIPITOR) 80 MG tablet Take 1/2 to 1 tablet daily or as directed for Cholesterol 30 tablet 11  . Cholecalciferol (VITAMIN D PO) Take 2,000 Int'l Units by mouth 2 (two) times daily.     . furosemide (LASIX) 40 MG tablet Take 1 tablet 1 or 2 x day for BP & Fluid 180 tablet 1  . GINKGO BILOBA PLUS PO Take 60 mg by mouth daily.    [name] levothyroxine (SYNTHROID, LEVOTHROID) 75 MCG tablet TAKE 1 TABLET EVERY DAY 90 tablet 1  . ranitidine (ZANTAC) 300 MG capsule Take 1 capsule (300 mg total) by mouth every evening. 90 capsule 3  . vitamin C (ASCORBIC ACID) 500 MG tablet Take 500 mg by mouth daily.     No current facility-administered medications on file prior to visit.    Current Problems (verified) Patient Active Problem List   Diagnosis Date Noted  . BMI 27.0-27.9,adult 09/21/2014  . Medicare annual wellness visit, subsequent 09/21/2014  . Medication management 08/15/2013  . Prediabetes 01/26/2013  . Vitamin D deficiency 12/13/2012  . GERD   . Hyperlipidemia   . Depression, controlled   . Essential hypertension 09/09/2008    Screening Tests Health Maintenance  Topic Date Due  . Hepatitis C Screening  07-Aug-1945  . ZOSTAVAX  12/01/2004  . INFLUENZA VACCINE  08/07/2015  . MAMMOGRAM  08/21/2016  . COLONOSCOPY  07/07/2018  . TETANUS/TDAP  01/20/2022  . DEXA SCAN  Completed  . PNA vac Low Risk Adult  Completed     Immunization History  Administered Date(s) Administered  . Influenza, High Dose Seasonal PF 10/18/2014  . Pneumococcal Conjugate-13 06/21/2014  . Pneumococcal Polysaccharide-23 01/21/2012  . Td 01/21/2012    Preventative care: Last colonoscopy: 07/2008 Last mammogram: 08/2014 Last pap smear/pelvic exam: remote DEXA: 08/2014, NORMAL  Prior vaccinations: TD or Tdap: 2014  Influenza: 2016 Pneumococcal: 2014 Prevnar 13: 2016 Shingles/Zostavax: declines  Allergies Allergies  Allergen  Reactions  . Aspirin     REACTION: GI ulcers  . Fish Oil     Allergic to fish oil and shell fish  . Morphine And Related   . Sulfa Antibiotics   . Vicodin [Hydrocodone-Acetaminophen] Nausea Only   Surgical history Past Surgical History  Procedure Laterality Date  . Back surgery    . Appendectomy    . Cesarean section    . Abdominal hysterectomy  1981    partial  . Salpingoophorectomy  1981 left    1982 right   Family history Family History  Problem Relation Age of Onset  . Diabetes Mother   . Kidney disease Mother   . Heart disease Mother   . Hypertension Father   . Heart disease Father   . Heart disease Sister    Risk Factors: Osteoporosis: postmenopausal estrogen deficiency and dietary calcium and/or vitamin D deficiency History of fracture in the past year: no  Tobacco Social History  Substance Use Topics  . Smoking status: Former Research scientist (life sciences)  . Smokeless tobacco: None  . Alcohol Use: No   She does not smoke.  Patient is a former smoker. Are there smokers in your home (other than you)?  No  Alcohol Current alcohol use: none  Caffeine Current  caffeine use: denies use  Exercise Exercise limitations: The patient has no exercise limitations. Current exercise: housecleaning and walking  Nutrition/Diet Current diet: in general, a "healthy" diet    Cardiac risk factors: advanced age (older than 27 for men, 30 for women), dyslipidemia, hypertension, obesity (BMI >= 30 kg/m2) and sedentary lifestyle.  Depression Screen (Note: if answer to either of the following is "Yes", a more complete depression screening is indicated)   Q1: Over the past two weeks, have you felt down, depressed or hopeless? No  Q2: Over the past two weeks, have you felt little interest or pleasure in doing things? No  Have you lost interest or pleasure in daily life? No  Do you often feel hopeless? No  Do you cry easily over simple problems? No  Activities of Daily Living In your present  state of health, do you have any difficulty performing the following activities?:  Driving? No Managing money?  No Feeding yourself? No Getting from bed to chair? No Climbing a flight of stairs? No Preparing food and eating?: No Bathing or showering? No Getting dressed: No Getting to the toilet? No Using the toilet:No Moving around from place to place: No In the past year have you fallen or had a near fall?:No   Are you sexually active?  No  Do you have more than one partner?  No  Vision Difficulties: Yes  Hearing Difficulties: No Do you often ask people to speak up or repeat themselves? No Do you experience ringing or noises in your ears? Yes Do you have difficulty understanding soft or whispered voices? No  Cognition  Do you feel that you have a problem with memory?No  Do you often misplace items? No  Do you feel safe at home?  Yes  Advanced directives Does patient have a [place]? No Does patient have a Living Will? No Information given.   Objective:   Blood pressure 110/60, pulse 62, temperature 97.3 F (36.3 C), temperature source Temporal, resp. rate 16, height 5\' 2"  (1.575 m), weight 150 lb (68.04 kg), SpO2 96 %. Body mass index is 27.43 kg/(m^2).  General appearance: alert, no distress, WD/WN,  female Cognitive Testing  Alert? Yes  Normal Appearance?Yes  Oriented to person? Yes  Place? Yes   Time? Yes  Recall of three objects?  Yes  Can perform simple calculations? Yes  Displays appropriate judgment?Yes  Can read the correct time from a watch face?Yes  HEENT: normocephalic, sclerae anicteric, TMs pearly, nares patent, no discharge or erythema, pharynx normal Oral cavity: MMM, no lesions Neck: supple, no lymphadenopathy, no thyromegaly, no masses Heart: RRR, normal S1, S2, no murmurs Lungs: CTA bilaterally, no wheezes, rhonchi, or rales Abdomen: +bs, soft, non tender, non distended, no masses, no hepatomegaly, no  splenomegaly Musculoskeletal: nontender, no swelling, no obvious deformity Extremities: no edema, no cyanosis, no clubbing Pulses: 2+ symmetric, upper and lower extremities, normal cap refill Neurological: alert, oriented x 3, CN2-12 intact, strength normal upper extremities and lower extremities, sensation normal throughout, DTRs 2+ throughout, no cerebellar signs, gait normal Psychiatric: normal affect, behavior normal, pleasant  Breast: defer Gyn: defer Rectal: defer    Medicare Attestation I have personally reviewed: The patient's medical and social history Their use of alcohol, tobacco or illicit drugs Their current medications and supplements The patient's functional ability including ADLs,fall risks, home safety risks, cognitive, and hearing and visual impairment Diet and physical activities Evidence for depression or mood disorders  The patient's weight, height,  BMI, and visual acuity have been recorded in the chart.  I have made referrals, counseling, and provided education to the patient based on review of the above and I have provided the patient with a written personalized care plan for preventive services.     Vicie Mutters, PA-C   01/19/2015

## 2015-01-20 LAB — VITAMIN D 25 HYDROXY (VIT D DEFICIENCY, FRACTURES): Vit D, 25-Hydroxy: 81 ng/mL (ref 30–100)

## 2015-01-25 ENCOUNTER — Other Ambulatory Visit: Payer: Self-pay | Admitting: Physician Assistant

## 2015-01-25 MED ORDER — ALPRAZOLAM 1 MG PO TABS: ORAL_TABLET | ORAL | Status: DC

## 2015-01-25 NOTE — Progress Notes (Signed)
Rx called into [facility]. 

## 2015-02-05 ENCOUNTER — Other Ambulatory Visit: Payer: Self-pay | Admitting: Internal Medicine

## 2015-03-08 ENCOUNTER — Encounter: Payer: Self-pay | Admitting: Internal Medicine

## 2015-04-02 ENCOUNTER — Encounter: Payer: Self-pay | Admitting: Internal Medicine

## 2015-04-10 ENCOUNTER — Encounter: Payer: Self-pay | Admitting: Internal Medicine

## 2015-04-11 ENCOUNTER — Ambulatory Visit (INDEPENDENT_AMBULATORY_CARE_PROVIDER_SITE_OTHER): Payer: PPO | Admitting: Physician Assistant

## 2015-04-11 VITALS — BP 100/70 | HR 70 | Temp 97.3°F | Resp 16 | Ht 62.0 in | Wt 151.4 lb

## 2015-04-11 DIAGNOSIS — K2100 Gastro-esophageal reflux disease with esophagitis, without bleeding: Secondary | ICD-10-CM

## 2015-04-11 DIAGNOSIS — R519 Headache, unspecified: Secondary | ICD-10-CM

## 2015-04-11 DIAGNOSIS — R51 Headache: Secondary | ICD-10-CM

## 2015-04-11 DIAGNOSIS — J0100 Acute maxillary sinusitis, unspecified: Secondary | ICD-10-CM

## 2015-04-11 MED ORDER — AZITHROMYCIN 250 MG PO TABS: ORAL_TABLET | ORAL | Status: AC

## 2015-04-11 MED ORDER — CYCLOBENZAPRINE HCL 10 MG PO TABS: 10.0000 mg | ORAL_TABLET | Freq: Every day | ORAL | Status: DC

## 2015-04-11 NOTE — Progress Notes (Signed)
Subjective:    Patient ID: [name], female    DOB: 1945/10/26, 70 y.o.   MRN: [id number]  HPI 70 y.o. WF with history of predM, chol with HA x 2 weeks. She has tried tylenol, sudafed, and tylenol sinus that has helped some. Has at night and when she wakes up in the morning, has had before in the past but will go away.  She is have bilateral neck pain, up and around to her sinuses. Pain is worse with turning head side to side or keeping it in one position. She has had mild sore throat in the AM, hoarseness with talking, rhinorrhea. Has had chills but no fever. She has some blurry vision with HA, no changes in speech, weakness, no dizziness. She has had some fatigue.   Has had increased stress with sister in hospice with terminal liver disease.    Blood pressure 100/70, pulse 70, temperature 97.3 F (36.3 C), temperature source Temporal, resp. rate 16, height 5\' 2"  (1.575 m), weight 151 lb 6.4 oz (68.675 kg), SpO2 98 %.  Current Outpatient Prescriptions on File Prior to Visit  Medication Sig Dispense Refill  . ALPRAZolam (XANAX) 1 MG tablet TAKE 1/2-1 TABLET THREE TIMES A DAY AS NEEDED 90 tablet 2  . aspirin 81 MG tablet Take 81 mg by mouth daily.    [name] atorvastatin (LIPITOR) 80 MG tablet Take 1/2 to 1 tablet daily or as directed for Cholesterol 30 tablet 11  . Cholecalciferol (VITAMIN D PO) Take 2,000 Int'l Units by mouth 2 (two) times daily.     . furosemide (LASIX) 40 MG tablet Take 1 tablet 1 or 2 x day for BP & Fluid 180 tablet 1  . GINKGO BILOBA PLUS PO Take 60 mg by mouth daily.    [name] levothyroxine (SYNTHROID, LEVOTHROID) 75 MCG tablet TAKE 1 TABLET EVERY DAY 90 tablet 1  . ranitidine (ZANTAC) 300 MG capsule Take 1 capsule (300 mg total) by mouth every evening. 90 capsule 3  . vitamin C (ASCORBIC ACID) 500 MG tablet Take 500 mg by mouth daily.     No current facility-administered medications on file prior to visit.   Patient Active Problem List   Diagnosis Date Noted  .  Medicare annual wellness visit, subsequent 09/21/2014  . Medication management 08/15/2013  . Other abnormal glucose 01/26/2013  . Vitamin D deficiency 12/13/2012  . GERD   . Hyperlipidemia   . Depression, major, in remission ([place])   . Essential hypertension 09/09/2008   Review of Systems  Constitutional: Negative for chills and diaphoresis.  HENT: Positive for postnasal drip, sinus pressure, sore throat and voice change. Negative for congestion, ear pain, sneezing and trouble swallowing.   Eyes: Negative.  Negative for photophobia and visual disturbance.  Respiratory: Positive for cough. Negative for chest tightness, shortness of breath and wheezing.   Cardiovascular: Negative.   Gastrointestinal: Negative.   Genitourinary: Negative.   Musculoskeletal: Negative for neck pain.  Neurological: Positive for headaches. Negative for dizziness, tremors, seizures, syncope, facial asymmetry, speech difficulty, weakness, light-headedness and numbness.       Objective:   Physical Exam  Constitutional: She appears well-developed and well-nourished.  HENT:  Head: Normocephalic and atraumatic.  Right Ear: External ear normal.  Nose: Right sinus exhibits maxillary sinus tenderness. Right sinus exhibits no frontal sinus tenderness. Left sinus exhibits maxillary sinus tenderness. Left sinus exhibits no frontal sinus tenderness.  Eyes: Conjunctivae and EOM are normal.  Neck: Normal range of motion. Neck supple.  Muscular tenderness present. No Brudzinski's sign and no Kernig's sign noted.  Cardiovascular: Normal rate, regular rhythm, normal heart sounds and intact distal pulses.   Pulmonary/Chest: Effort normal and breath sounds normal. No respiratory distress. She has no wheezes.  Abdominal: Soft. Bowel sounds are normal.  Lymphadenopathy:    She has no cervical adenopathy.  Skin: Skin is warm and dry.       Assessment & Plan:  Headache ? Sinusitis versus mechanical- worse with turning head,  no fever, no meningeal signs Will given ABX, continue OTC meds, get on nexium samples Versus mechangical, flexeril at night.  patient to go to ER if there is weakness, thunderclap headache, visual changes, or any concerning factors

## 2015-04-11 NOTE — Patient Instructions (Signed)

## 2015-04-26 ENCOUNTER — Ambulatory Visit (INDEPENDENT_AMBULATORY_CARE_PROVIDER_SITE_OTHER): Payer: Self-pay | Admitting: Internal Medicine

## 2015-04-26 ENCOUNTER — Encounter: Payer: Self-pay | Admitting: Internal Medicine

## 2015-04-26 VITALS — BP 112/76 | HR 76 | Temp 97.3°F | Resp 16 | Ht 61.0 in | Wt 150.4 lb

## 2015-04-26 DIAGNOSIS — K2100 Gastro-esophageal reflux disease with esophagitis, without bleeding: Secondary | ICD-10-CM

## 2015-04-26 DIAGNOSIS — Z79899 Other long term (current) drug therapy: Secondary | ICD-10-CM

## 2015-04-26 DIAGNOSIS — E782 Mixed hyperlipidemia: Secondary | ICD-10-CM | POA: Diagnosis not present

## 2015-04-26 DIAGNOSIS — Z1212 Encounter for screening for malignant neoplasm of rectum: Secondary | ICD-10-CM

## 2015-04-26 DIAGNOSIS — R6889 Other general symptoms and signs: Secondary | ICD-10-CM

## 2015-04-26 DIAGNOSIS — Z0001 Encounter for general adult medical examination with abnormal findings: Secondary | ICD-10-CM

## 2015-04-26 DIAGNOSIS — Z136 Encounter for screening for cardiovascular disorders: Secondary | ICD-10-CM

## 2015-04-26 DIAGNOSIS — R7309 Other abnormal glucose: Secondary | ICD-10-CM | POA: Diagnosis not present

## 2015-04-26 DIAGNOSIS — E559 Vitamin D deficiency, unspecified: Secondary | ICD-10-CM

## 2015-04-26 DIAGNOSIS — I10 Essential (primary) hypertension: Secondary | ICD-10-CM | POA: Diagnosis not present

## 2015-04-26 LAB — CBC WITH DIFFERENTIAL/PLATELET
BASOS ABS: 55 {cells}/uL (ref 0–200)
BASOS PCT: 1 %
EOS ABS: 220 {cells}/uL (ref 15–500)
Eosinophils Relative: 4 %
HCT: 38.6 % (ref 35.0–45.0)
HEMOGLOBIN: 12.9 g/dL (ref 11.7–15.5)
LYMPHS ABS: 1540 {cells}/uL (ref 850–3900)
Lymphocytes Relative: 28 %
MCH: 30.3 pg (ref 27.0–33.0)
MCHC: 33.4 g/dL (ref 32.0–36.0)
MCV: 90.6 fL (ref 80.0–100.0)
MONOS PCT: 8 %
MPV: 10.5 fL (ref 7.5–12.5)
Monocytes Absolute: 440 cells/uL (ref 200–950)
NEUTROS ABS: 3245 {cells}/uL (ref 1500–7800)
Neutrophils Relative %: 59 %
PLATELETS: 300 10*3/uL (ref 140–400)
RBC: 4.26 MIL/uL (ref 3.80–5.10)
RDW: 13.9 % (ref 11.0–15.0)
WBC: 5.5 10*3/uL (ref 3.8–10.8)

## 2015-04-26 NOTE — Patient Instructions (Signed)

## 2015-04-26 NOTE — Progress Notes (Signed)
Patient ID: [name], female   DOB: 12/18/1945, 70 y.o.   MRN: [id number]  Annual Screening/Preventative Visit And Comprehensive Evaluation &  Examination  This very nice 70 [facility] presents for a Wellness/Preventative Visit & comprehensive evaluation and management of multiple medical co-morbidities.  Patient has been followed for HTN, Prediabetes, Hyperlipidemia and Vitamin D Deficiency.    HTN predates since  1995. Patient's BP has been controlled at home and patient denies any cardiac symptoms as chest pain, palpitations, shortness of breath, dizziness or ankle swelling. Patient had a negative Cardiolite scan in 2008.  Today's BP: 112/76 mmHg    Patient's hyperlipidemia is not controlled with diet and medications. Patient denies myalgias or other medication SE's. Last lipids were not at goal with Cholesterol 190; HDL 47; LDL 116; Triglycerides 134 on 01/19/2015.   Patient has prediabetes predating since 2011 with A1c 5.7% and patient denies reactive hypoglycemic symptoms, visual blurring, diabetic polys, or paresthesias. Last A1c was 5.5% on 01/19/2015.   Finally, patient has history of Vitamin D Deficiency of "40" in 2008  and last Vitamin D was 81 on 01/19/2015.   Medication Sig  . ALPRAZolam  1 MG  TAKE 1/2-1 TABLET THREE TIMES A DAY AS NEEDED  . aspirin 81 MG tablet Take 81 mg by mouth daily.  [name] atorvastatin  80 MG  Take 1/2 to 1 tablet daily or as directed for Cholesterol  . VITAMIN D Take 2,000 Int'l Units by mouth 2 (two) times daily.   . cyclobenzaprine  10 MG Take 1 tablet (10 mg total) by mouth at bedtime.  . furosemide  40 MG Take 1 tablet 1 or 2 x day for BP & Fluid  . GINKGO BILOBA PLUS  Take 60 mg by mouth daily.  [name] levothyroxine  75 MCG  TAKE 1 TABLET EVERY DAY  . ranitidine  300 MG  Take 1 capsule (300 mg total) by mouth every evening.  . vitamin C  500 MG Take 500 mg by mouth daily.   Allergies  Allergen Reactions  . Aspirin     REACTION: GI ulcers  . Fish Oil      Allergic to fish oil and shell fish  . Morphine And Related   . Sulfa Antibiotics   . Vicodin [Hydrocodone-Acetaminophen] Nausea Only   Past Medical History  Diagnosis Date  . Vitamin D deficiency    Health Maintenance  Topic Date Due  . Hepatitis C Screening  06/25/1945  . ZOSTAVAX  06/19/2016 (Originally 12/01/2004)  . INFLUENZA VACCINE  08/07/2015  . MAMMOGRAM  08/21/2016  . COLONOSCOPY  07/07/2018  . TETANUS/TDAP  01/20/2022  . DEXA SCAN  Completed  . PNA vac Low Risk Adult  Completed   Immunization History  Administered Date(s) Administered  . Influenza, High Dose Seasonal PF 10/18/2014  . Pneumococcal Conjugate-13 06/21/2014  . Pneumococcal Polysaccharide-23 01/21/2012  . Td 01/21/2012   Past Surgical History  Procedure Laterality Date  . Back surgery    . Appendectomy    . Cesarean section    . Abdominal hysterectomy  1981    partial  . Salpingoophorectomy  1981 left    1982 right   Family History  Problem Relation Age of Onset  . Diabetes Mother   . Kidney disease Mother   . Heart disease Mother   . Hypertension Father   . Heart disease Father   . Heart disease Sister    Social History  Substance Use Topics  . Smoking status:  Former Smoker  . Smokeless tobacco: None  . Alcohol Use: No    ROS Constitutional: Denies fever, chills, weight loss/gain, headaches, insomnia,  night sweats, and change in appetite. Does c/o fatigue. Eyes: Denies redness, blurred vision, diplopia, discharge, itchy, watery eyes.  ENT: Denies discharge, congestion, post nasal drip, epistaxis, sore throat, earache, hearing loss, dental pain, Tinnitus, Vertigo, Sinus pain, snoring.  Cardio: Denies chest pain, palpitations, irregular heartbeat, syncope, dyspnea, diaphoresis, orthopnea, PND, claudication, edema Respiratory: denies cough, dyspnea, DOE, pleurisy, hoarseness, laryngitis, wheezing.  Gastrointestinal: Denies dysphagia, heartburn, reflux, water brash, pain, cramps,  nausea, vomiting, bloating, diarrhea, constipation, hematemesis, melena, hematochezia, jaundice, hemorrhoids Genitourinary: Denies dysuria, frequency, urgency, nocturia, hesitancy, discharge, hematuria, flank pain Breast: Breast lumps, nipple discharge, bleeding.  Musculoskeletal: Denies arthralgia, myalgia, stiffness, Jt. Swelling, pain, limp, and strain/sprain. Denies falls. Skin: Denies puritis, rash, hives, warts, acne, eczema, changing in skin lesion Neuro: No weakness, tremor, incoordination, spasms, paresthesia, pain Psychiatric: Denies confusion, memory loss, sensory loss. Denies Depression. Endocrine: Denies change in weight, skin, hair change, nocturia, and paresthesia, diabetic polys, visual blurring, hyper / hypo glycemic episodes.  Heme/Lymph: No excessive bleeding, bruising, enlarged lymph nodes.  Physical Exam  BP 112/76 mmHg  Pulse 76  Temp(Src) 97.3 F (36.3 C)  Resp 16  Ht 5\' 1"  (1.549 m)  Wt 150 lb 6.4 oz (68.221 kg)  BMI 28.43 kg/m2  General Appearance: Well nourished and in no apparent distress. Eyes: PERRLA, EOMs, conjunctiva no swelling or erythema, normal fundi and vessels. Sinuses: No frontal/maxillary tenderness ENT/Mouth: EACs patent / TMs  nl. Nares clear without erythema, swelling, mucoid exudates. Oral hygiene is good. No erythema, swelling, or exudate. Tongue normal, non-obstructing. Tonsils not swollen or erythematous. Hearing normal.  Neck: Supple, thyroid normal. No bruits, nodes or JVD. Respiratory: Respiratory effort normal.  BS equal and clear bilateral without rales, rhonci, wheezing or stridor. Cardio: Heart sounds are normal with regular rate and rhythm and no murmurs, rubs or gallops. Peripheral pulses are normal and equal bilaterally without edema. No aortic or femoral bruits. Chest: symmetric with normal excursions and percussion. Breasts: Symmetric, without lumps, nipple discharge, retractions, or fibrocystic changes.  Abdomen: Flat, soft,  with bowel sounds. Nontender, no guarding, rebound, hernias, masses, or organomegaly.  Lymphatics: Non tender without lymphadenopathy.  Musculoskeletal: Full ROM all peripheral extremities, joint stability, 5/5 strength, and normal gait. Skin: Warm and dry without rashes, lesions, cyanosis, clubbing or  ecchymosis.  Neuro: Cranial nerves intact, reflexes equal bilaterally. Normal muscle tone, no cerebellar symptoms. Sensation intact.  Pysch: Alert and oriented X 3, normal affect, Insight and Judgment appropriate.   Assessment and Plan  1. Annual Preventative Screening Examination  - Microalbumin / creatinine urine ratio - EKG 12-Lead - [place], RETROPERITNL ABD,  LTD - POC Hemoccult Bld/Stl ( - Urinalysis, Routine w reflex microscopic - CBC with Differential/Platelet - BASIC METABOLIC PANEL WITH GFR - Hepatic function panel - Magnesium - Lipid panel - TSH - Hemoglobin A1c - Insulin, random - VITAMIN D 25 Hydroxy   2. Essential hypertension  - Microalbumin / creatinine urine ratio - EKG 12-Lead - [place], RETROPERITNL ABD,  LTD - TSH  3. Hyperlipidemia  - Lipid panel - TSH  4. Other abnormal glucose  - Hemoglobin A1c - Insulin, random  5. Vitamin D deficiency  - VITAMIN D 25 Hydroxy   6. GERD   7. Screening for rectal cancer  - POC Hemoccult Bld/Stl   8. Screening for ischemic heart disease   9. Screening for AAA (  aortic abdominal aneurysm)   10. Medication management  - Urinalysis, Routine w reflex microscopic  - CBC with Differential/Platelet - BASIC METABOLIC PANEL WITH GFR - Hepatic function panel - Magnesium   Continue prudent diet as discussed, weight control, BP monitoring, regular exercise, and medications. Discussed med's effects and SE's. Screening labs and tests as requested with regular follow-up as recommended. Over 40 minutes of exam, counseling, chart review and high complex critical decision making was performed.

## 2015-04-27 LAB — TSH: TSH: 2.56 m[IU]/L

## 2015-04-27 LAB — MAGNESIUM: Magnesium: 2.2 mg/dL (ref 1.5–2.5)

## 2015-04-27 LAB — HEPATIC FUNCTION PANEL
ALK PHOS: 61 U/L (ref 33–130)
ALT: 17 U/L (ref 6–29)
AST: 23 U/L (ref 10–35)
Albumin: 4.5 g/dL (ref 3.6–5.1)
BILIRUBIN DIRECT: 0.1 mg/dL (ref <=0.2)
BILIRUBIN TOTAL: 0.5 mg/dL (ref 0.2–1.2)
Indirect Bilirubin: 0.4 mg/dL (ref 0.2–1.2)
Total Protein: 7.2 g/dL (ref 6.1–8.1)

## 2015-04-27 LAB — BASIC METABOLIC PANEL WITH GFR
BUN: 18 mg/dL (ref 7–25)
CHLORIDE: 101 mmol/L (ref 98–110)
CO2: 27 mmol/L (ref 20–31)
CREATININE: 0.79 mg/dL (ref 0.60–0.93)
Calcium: 9.7 mg/dL (ref 8.6–10.4)
GFR, Est African American: 88 mL/min (ref >=60)
GFR, Est Non African American: 76 mL/min (ref >=60)
Glucose, Bld: 90 mg/dL (ref 65–99)
POTASSIUM: 5.3 mmol/L (ref 3.5–5.3)
SODIUM: 141 mmol/L (ref 135–146)

## 2015-04-27 LAB — VITAMIN D 25 HYDROXY (VIT D DEFICIENCY, FRACTURES): Vit D, 25-Hydroxy: 62 ng/mL (ref 30–100)

## 2015-04-27 LAB — URINALYSIS, ROUTINE W REFLEX MICROSCOPIC
Bilirubin Urine: NEGATIVE
GLUCOSE, UA: NEGATIVE
Hgb urine dipstick: NEGATIVE
Ketones, ur: NEGATIVE
LEUKOCYTES UA: NEGATIVE
NITRITE: NEGATIVE
PH: 7.5 (ref 5.0–8.0)
Protein, ur: NEGATIVE
SPECIFIC GRAVITY, URINE: 1.02 (ref 1.001–1.035)

## 2015-04-27 LAB — LIPID PANEL
CHOL/HDL RATIO: 4.1 ratio (ref <=5.0)
Cholesterol: 180 mg/dL (ref 125–200)
HDL: 44 mg/dL — AB (ref >=46)
LDL CALC: 103 mg/dL (ref <130)
Triglycerides: 165 mg/dL — ABNORMAL HIGH (ref <150)
VLDL: 33 mg/dL — ABNORMAL HIGH (ref <30)

## 2015-04-27 LAB — MICROALBUMIN / CREATININE URINE RATIO
Creatinine, Urine: 99 mg/dL (ref 20–320)
MICROALB UR: 0.4 mg/dL
Microalb Creat Ratio: 4 mcg/mg creat (ref <30)

## 2015-04-27 LAB — HEMOGLOBIN A1C
HEMOGLOBIN A1C: 5.8 % — AB (ref <5.7)
Mean Plasma Glucose: 120 mg/dL

## 2015-04-27 LAB — INSULIN, RANDOM: Insulin: 3.5 u[IU]/mL (ref 2.0–19.6)

## 2015-06-02 ENCOUNTER — Other Ambulatory Visit: Payer: Self-pay | Admitting: Internal Medicine

## 2015-07-23 ENCOUNTER — Other Ambulatory Visit: Payer: Self-pay | Admitting: *Deleted

## 2015-07-23 DIAGNOSIS — Z0001 Encounter for general adult medical examination with abnormal findings: Secondary | ICD-10-CM

## 2015-07-23 DIAGNOSIS — Z1212 Encounter for screening for malignant neoplasm of rectum: Secondary | ICD-10-CM

## 2015-07-23 LAB — POC HEMOCCULT BLD/STL (HOME/3-CARD/SCREEN)
Card #3 Fecal Occult Blood, POC: NEGATIVE
FECAL OCCULT BLD: NEGATIVE
FECAL OCCULT BLD: NEGATIVE

## 2015-07-30 ENCOUNTER — Other Ambulatory Visit: Payer: Self-pay | Admitting: Physician Assistant

## 2015-07-31 ENCOUNTER — Other Ambulatory Visit: Payer: Self-pay | Admitting: *Deleted

## 2015-07-31 MED ORDER — ALPRAZOLAM 1 MG PO TABS: ORAL_TABLET | ORAL | 0 refills | Status: DC

## 2015-07-31 NOTE — Telephone Encounter (Signed)
Rx called into [facility]. 

## 2015-08-03 ENCOUNTER — Encounter: Payer: Self-pay | Admitting: Internal Medicine

## 2015-08-03 ENCOUNTER — Other Ambulatory Visit: Payer: Self-pay | Admitting: Internal Medicine

## 2015-08-03 ENCOUNTER — Ambulatory Visit (INDEPENDENT_AMBULATORY_CARE_PROVIDER_SITE_OTHER): Payer: PPO | Admitting: Internal Medicine

## 2015-08-03 VITALS — BP 106/60 | HR 66 | Temp 98.0°F | Ht 61.0 in | Wt 148.0 lb

## 2015-08-03 DIAGNOSIS — E782 Mixed hyperlipidemia: Secondary | ICD-10-CM

## 2015-08-03 DIAGNOSIS — R7309 Other abnormal glucose: Secondary | ICD-10-CM

## 2015-08-03 DIAGNOSIS — Z79899 Other long term (current) drug therapy: Secondary | ICD-10-CM | POA: Diagnosis not present

## 2015-08-03 DIAGNOSIS — F325 Major depressive disorder, single episode, in full remission: Secondary | ICD-10-CM

## 2015-08-03 DIAGNOSIS — I10 Essential (primary) hypertension: Secondary | ICD-10-CM

## 2015-08-03 DIAGNOSIS — E039 Hypothyroidism, unspecified: Secondary | ICD-10-CM

## 2015-08-03 DIAGNOSIS — E559 Vitamin D deficiency, unspecified: Secondary | ICD-10-CM

## 2015-08-03 LAB — CBC WITH DIFFERENTIAL/PLATELET
BASOS ABS: 53 {cells}/uL (ref 0–200)
Basophils Relative: 1 %
EOS PCT: 5 %
Eosinophils Absolute: 265 cells/uL (ref 15–500)
HCT: 40.8 % (ref 35.0–45.0)
Hemoglobin: 13.6 g/dL (ref 11.7–15.5)
LYMPHS PCT: 24 %
Lymphs Abs: 1272 cells/uL (ref 850–3900)
MCH: 30.4 pg (ref 27.0–33.0)
MCHC: 33.3 g/dL (ref 32.0–36.0)
MCV: 91.3 fL (ref 80.0–100.0)
MONOS PCT: 9 %
MPV: 10.5 fL (ref 7.5–12.5)
Monocytes Absolute: 477 cells/uL (ref 200–950)
NEUTROS ABS: 3233 {cells}/uL (ref 1500–7800)
NEUTROS PCT: 61 %
PLATELETS: 274 10*3/uL (ref 140–400)
RBC: 4.47 MIL/uL (ref 3.80–5.10)
RDW: 13.1 % (ref 11.0–15.0)
WBC: 5.3 10*3/uL (ref 3.8–10.8)

## 2015-08-03 LAB — BASIC METABOLIC PANEL WITH GFR
BUN: 15 mg/dL (ref 7–25)
CALCIUM: 9.7 mg/dL (ref 8.6–10.4)
CO2: 30 mmol/L (ref 20–31)
CREATININE: 0.73 mg/dL (ref 0.60–0.93)
Chloride: 104 mmol/L (ref 98–110)
GFR, Est Non African American: 84 mL/min (ref >=60)
Glucose, Bld: 98 mg/dL (ref 65–99)
Potassium: 4.9 mmol/L (ref 3.5–5.3)
SODIUM: 145 mmol/L (ref 135–146)

## 2015-08-03 LAB — HEPATIC FUNCTION PANEL
ALBUMIN: 4.4 g/dL (ref 3.6–5.1)
ALT: 16 U/L (ref 6–29)
AST: 24 U/L (ref 10–35)
Alkaline Phosphatase: 64 U/L (ref 33–130)
BILIRUBIN DIRECT: 0.1 mg/dL (ref <=0.2)
BILIRUBIN TOTAL: 0.5 mg/dL (ref 0.2–1.2)
Indirect Bilirubin: 0.4 mg/dL (ref 0.2–1.2)
Total Protein: 7.1 g/dL (ref 6.1–8.1)

## 2015-08-03 LAB — TSH: TSH: 1.63 m[IU]/L

## 2015-08-03 NOTE — Progress Notes (Signed)
Patient ID: [name], female   DOB: [birth year], 70 y.o.   MRN: [id number]  Assessment and Plan:  Hypertension:  -Continue medication,  -monitor blood pressure at home.  -Continue DASH diet.   -Reminder to go to the ER if any CP, SOB, nausea, dizziness, severe HA, changes vision/speech, left arm numbness and tingling, and jaw pain.  Cholesterol: -Continue diet and exercise.  -Check cholesterol.   Pre-diabetes: -Continue diet and exercise.   Vitamin D Def: -continue medications.   Hypothyroid -cont levothyroxine -TSH  Grief -consider adding in trazodone -patient to let us know if worsening depression      Continue diet and meds as discussed. Further disposition pending results of labs.  HPI 70 y.o. female  presents for 3 month follow up with hypertension, hyperlipidemia, prediabetes and vitamin D.   Her blood pressure has been controlled at home, today their BP is BP: 106/60.   She does workout. She denies chest pain, shortness of breath, dizziness.  [facility]    She is on cholesterol medication and denies myalgias. Her cholesterol is not at goal. The cholesterol last visit was:   Lab Results  Component Value Date   CHOL 180 04/26/2015   HDL 44 (L) 04/26/2015   LDLCALC 103 04/26/2015   TRIG 165 (H) 04/26/2015   CHOLHDL 4.1 04/26/2015     She has been working on diet and exercise for prediabetes, and denies foot ulcerations, hyperglycemia, hypoglycemia , nausea, paresthesia of the feet, polydipsia, polyuria, visual disturbances, vomiting and weight loss. Last A1C in the office was:  Lab Results  Component Value Date   HGBA1C 5.8 (H) 04/26/2015    Patient is on Vitamin D supplement.  Lab Results  Component Value Date   VD25OH 11 04/26/2015     She does admit to some worsening headaches which have been several times weekly.  She reports that sometimes they are mild.  They do respond to tylenol.  She did recently lose her  sister in March.  She is struggling with the loss but denies anhedonia or suicidal ideations.  Is not currently interested in medications.   She is taking her thyroid medication on an empty stomach and waiting 45 minutes prior to eating.  She is taking 1 pill on weekdays and a 1/2 pill on weekends.       Current Medications:  Current Outpatient Prescriptions on File Prior to Visit  Medication Sig Dispense Refill  . ALPRAZolam (XANAX) 1 MG tablet TAKE 1/2 TO 1 TABLET BY MOUTH 3 TIMES DAILY AS NEEDED 90 tablet 0  . aspirin 81 MG tablet Take 81 mg by mouth daily.    [name] atorvastatin (LIPITOR) 80 MG tablet Take 1/2 to 1 tablet daily or as directed for Cholesterol 30 tablet 11  . Cholecalciferol (VITAMIN D PO) Take 2,000 Int'l Units by mouth 2 (two) times daily.     [name] GINKGO BILOBA PLUS PO Take 60 mg by mouth daily.    [name] levothyroxine (SYNTHROID, LEVOTHROID) 75 MCG tablet TAKE 1 TABLET EVERY DAY 90 tablet 1  . ranitidine (ZANTAC) 300 MG capsule Take 1 capsule (300 mg total) by mouth every evening. 90 capsule 3  . vitamin C (ASCORBIC ACID) 500 MG tablet Take 500 mg by mouth daily.    . furosemide (LASIX) 40 MG tablet Take 1 tablet 1 or 2 x day for BP & Fluid 180 tablet 1   No current facility-administered medications on file prior to  visit.     Medical History:  Past Medical History:  Diagnosis Date  . Vitamin D deficiency     Allergies:  Allergies  Allergen Reactions  . Aspirin     REACTION: GI ulcers  . Fish Oil     Allergic to fish oil and shell fish  . Morphine And Related   . Sulfa Antibiotics   . Vicodin [Hydrocodone-Acetaminophen] Nausea Only     Review of Systems:  Review of Systems  Constitutional: Negative for chills, fever and malaise/fatigue.  HENT: Negative for congestion, ear pain and sore throat.   Eyes: Negative.   Respiratory: Negative for cough, shortness of breath and wheezing.   Cardiovascular: Negative for chest pain, palpitations and leg swelling.   Gastrointestinal: Negative for abdominal pain, blood in stool, constipation, diarrhea, heartburn and melena.  Genitourinary: Negative.   Skin: Negative.   Neurological: Negative for dizziness, sensory change, loss of consciousness and headaches.  Psychiatric/Behavioral: Negative for depression. The patient is not nervous/anxious and does not have insomnia.     Family history- Review and unchanged  Social history- Review and unchanged  Physical Exam: BP 106/60   Pulse 66   Temp 98 F (36.7 C) (Temporal)   Ht 5\' 1"  (1.549 m)   Wt 148 lb (67.1 kg)   BMI 27.96 kg/m  Wt Readings from Last 3 Encounters:  08/03/15 148 lb (67.1 kg)  04/26/15 150 lb 6.4 oz (68.2 kg)  04/11/15 151 lb 6.4 oz (68.7 kg)    General Appearance: Well nourished well developed, in no apparent distress. Eyes: PERRLA, EOMs, conjunctiva no swelling or erythema ENT/Mouth: Ear canals normal without obstruction, swelling, erythma, discharge.  TMs normal bilaterally.  Oropharynx moist, clear, without exudate, or postoropharyngeal swelling. Neck: Supple, thyroid normal,no cervical adenopathy  Respiratory: Respiratory effort normal, Breath sounds clear A&P without rhonchi, wheeze, or rale.  No retractions, no accessory usage. Cardio: RRR with no MRGs. Brisk peripheral pulses without edema.  Abdomen: Soft, + BS,  Non tender, no guarding, rebound, hernias, masses. Musculoskeletal: Full ROM, 5/5 strength, Normal gait Skin: Warm, dry without rashes, lesions, ecchymosis.  Neuro: Awake and oriented X 3, Cranial nerves intact. Normal muscle tone, no cerebellar symptoms. Psych: Normal affect, Insight and Judgment appropriate.    Starlyn Skeans, PA-C 11:03 AM [facility]

## 2015-08-03 NOTE — Patient Instructions (Signed)
Trazodone tablets What is this medicine? TRAZODONE (TRAZ oh done) is used to treat depression. This medicine may be used for other purposes; ask your health care provider or pharmacist if you have questions. What should I tell my health care provider before I take this medicine? They need to know if you have any of these conditions: -attempted suicide or thinking about it -bipolar disorder -bleeding problems -glaucoma -heart disease, or previous heart attack -irregular heart beat -kidney or liver disease -low levels of sodium in the blood -an unusual or allergic reaction to trazodone, other medicines, foods, dyes or preservatives -pregnant or trying to get pregnant -breast-feeding How should I use this medicine? Take this medicine by mouth with a glass of water. Follow the directions on the prescription label. Take this medicine shortly after a meal or a light snack. Take your medicine at regular intervals. Do not take your medicine more often than directed. Do not stop taking this medicine suddenly except upon the advice of your doctor. Stopping this medicine too quickly may cause serious side effects or your condition may worsen. A special MedGuide will be given to you by the pharmacist with each prescription and refill. Be sure to read this information carefully each time. Talk to your pediatrician regarding the use of this medicine in children. Special care may be needed. Overdosage: If you think you have taken too much of this medicine contact a poison control center or emergency room at once. NOTE: This medicine is only for you. Do not share this medicine with others. What if I miss a dose? If you miss a dose, take it as soon as you can. If it is almost time for your next dose, take only that dose. Do not take double or extra doses. What may interact with this medicine? Do not take this medicine with any of the following medications: -certain medicines for fungal infections like  fluconazole, itraconazole, ketoconazole, posaconazole, voriconazole -cisapride -dofetilide -dronedarone -linezolid -MAOIs like Carbex, Eldepryl, Marplan, Nardil, and Parnate -mesoridazine -methylene blue (injected into a vein) -pimozide -saquinavir -thioridazine -ziprasidone This medicine may also interact with the following medications: -alcohol -antiviral medicines for HIV or AIDS -aspirin and aspirin-like medicines -barbiturates like phenobarbital -certain medicines for blood pressure, heart disease, irregular heart beat -certain medicines for depression, anxiety, or psychotic disturbances -certain medicines for migraine headache like almotriptan, eletriptan, frovatriptan, naratriptan, rizatriptan, sumatriptan, zolmitriptan -certain medicines for seizures like carbamazepine and phenytoin -certain medicines for sleep -certain medicines that treat or prevent blood clots like dalteparin, enoxaparin, warfarin -digoxin -fentanyl -lithium -NSAIDS, medicines for pain and inflammation, like ibuprofen or naproxen -other medicines that prolong the QT interval (cause an abnormal heart rhythm) -rasagiline -supplements like St. John's wort, kava kava, valerian -tramadol -tryptophan This list may not describe all possible interactions. Give your health care provider a list of all the medicines, herbs, non-prescription drugs, or dietary supplements you use. Also tell them if you smoke, drink alcohol, or use illegal drugs. Some items may interact with your medicine. What should I watch for while using this medicine? Tell your doctor if your symptoms do not get better or if they get worse. Visit your doctor or health care professional for regular checks on your progress. Because it may take several weeks to see the full effects of this medicine, it is important to continue your treatment as prescribed by your doctor. Patients and their families should watch out for new or worsening thoughts of  suicide or depression. Also watch out for sudden  changes in feelings such as feeling anxious, agitated, panicky, irritable, hostile, aggressive, impulsive, severely restless, overly excited and hyperactive, or not being able to sleep. If this happens, especially at the beginning of treatment or after a change in dose, call your health care professional. Dennis Bast may get drowsy or dizzy. Do not drive, use machinery, or do anything that needs mental alertness until you know how this medicine affects you. Do not stand or sit up quickly, especially if you are an older patient. This reduces the risk of dizzy or fainting spells. Alcohol may interfere with the effect of this medicine. Avoid alcoholic drinks. This medicine may cause dry eyes and blurred vision. If you wear contact lenses you may feel some discomfort. Lubricating drops may help. See your eye doctor if the problem does not go away or is severe. Your mouth may get dry. Chewing sugarless gum, sucking hard candy and drinking plenty of water may help. Contact your doctor if the problem does not go away or is severe. What side effects may I notice from receiving this medicine? Side effects that you should report to your doctor or health care professional as soon as possible: -allergic reactions like skin rash, itching or hives, swelling of the face, lips, or tongue -fast, irregular heartbeat -feeling faint or lightheaded, falls -painful erections or other sexual dysfunction -suicidal thoughts or other mood changes -trembling Side effects that usually do not require medical attention (report to your doctor or health care professional if they continue or are bothersome): -constipation -headache -muscle aches or pains -nausea, vomiting -unusually weak or tired This list may not describe all possible side effects. Call your doctor for medical advice about side effects. You may report side effects to FDA at 1-800-FDA-1088. Where should I keep my  medicine? Keep out of the reach of children. Store at room temperature between 15 and 30 degrees C (59 to 86 degrees F). Protect from light. Keep container tightly closed. Throw away any unused medicine after the expiration date. NOTE: This sheet is a summary. It may not cover all possible information. If you have questions about this medicine, talk to your doctor, pharmacist, or health care provider.    2016, [employer] Standard. (2012-07-26 15:46:28)

## 2015-09-12 ENCOUNTER — Other Ambulatory Visit: Payer: Self-pay | Admitting: Internal Medicine

## 2015-09-12 DIAGNOSIS — Z1231 Encounter for screening mammogram for malignant neoplasm of breast: Secondary | ICD-10-CM

## 2015-09-14 ENCOUNTER — Ambulatory Visit
Admission: RE | Admit: 2015-09-14 | Discharge: 2015-09-14 | Disposition: A | Discharge status: Home / Self Care | Payer: PPO | Source: Ambulatory Visit | Attending: Internal Medicine | Admitting: Internal Medicine

## 2015-09-14 DIAGNOSIS — Z1231 Encounter for screening mammogram for malignant neoplasm of breast: Secondary | ICD-10-CM

## 2015-09-29 ENCOUNTER — Other Ambulatory Visit: Payer: Self-pay | Admitting: Internal Medicine

## 2015-09-30 ENCOUNTER — Other Ambulatory Visit: Payer: Self-pay | Admitting: Internal Medicine

## 2015-09-30 DIAGNOSIS — I10 Essential (primary) hypertension: Secondary | ICD-10-CM

## 2015-10-01 NOTE — Telephone Encounter (Signed)
RX CALLED INTO [facility]. 

## 2015-11-06 ENCOUNTER — Ambulatory Visit (INDEPENDENT_AMBULATORY_CARE_PROVIDER_SITE_OTHER): Payer: PPO | Admitting: Internal Medicine

## 2015-11-06 ENCOUNTER — Encounter: Payer: Self-pay | Admitting: Internal Medicine

## 2015-11-06 VITALS — BP 124/80 | HR 72 | Temp 97.9°F | Ht 61.0 in | Wt 147.8 lb

## 2015-11-06 DIAGNOSIS — Z23 Encounter for immunization: Secondary | ICD-10-CM

## 2015-11-06 DIAGNOSIS — Z79899 Other long term (current) drug therapy: Secondary | ICD-10-CM

## 2015-11-06 DIAGNOSIS — I10 Essential (primary) hypertension: Secondary | ICD-10-CM

## 2015-11-06 DIAGNOSIS — E559 Vitamin D deficiency, unspecified: Secondary | ICD-10-CM | POA: Diagnosis not present

## 2015-11-06 DIAGNOSIS — H8110 Benign paroxysmal vertigo, unspecified ear: Secondary | ICD-10-CM | POA: Diagnosis not present

## 2015-11-06 DIAGNOSIS — R7309 Other abnormal glucose: Secondary | ICD-10-CM

## 2015-11-06 DIAGNOSIS — E782 Mixed hyperlipidemia: Secondary | ICD-10-CM | POA: Diagnosis not present

## 2015-11-06 DIAGNOSIS — R7303 Prediabetes: Secondary | ICD-10-CM | POA: Diagnosis not present

## 2015-11-06 DIAGNOSIS — K2100 Gastro-esophageal reflux disease with esophagitis, without bleeding: Secondary | ICD-10-CM

## 2015-11-06 LAB — HEPATIC FUNCTION PANEL
ALBUMIN: 4.8 g/dL (ref 3.6–5.1)
ALT: 16 U/L (ref 6–29)
AST: 24 U/L (ref 10–35)
Alkaline Phosphatase: 54 U/L (ref 33–130)
Bilirubin, Direct: 0.1 mg/dL (ref <=0.2)
Indirect Bilirubin: 0.5 mg/dL (ref 0.2–1.2)
TOTAL PROTEIN: 7.5 g/dL (ref 6.1–8.1)
Total Bilirubin: 0.6 mg/dL (ref 0.2–1.2)

## 2015-11-06 LAB — CBC WITH DIFFERENTIAL/PLATELET
BASOS ABS: 48 {cells}/uL (ref 0–200)
Basophils Relative: 1 %
EOS ABS: 144 {cells}/uL (ref 15–500)
EOS PCT: 3 %
HCT: 41.4 % (ref 35.0–45.0)
HEMOGLOBIN: 14 g/dL (ref 11.7–15.5)
LYMPHS ABS: 1152 {cells}/uL (ref 850–3900)
Lymphocytes Relative: 24 %
MCH: 30.4 pg (ref 27.0–33.0)
MCHC: 33.8 g/dL (ref 32.0–36.0)
MCV: 89.8 fL (ref 80.0–100.0)
MPV: 10.8 fL (ref 7.5–12.5)
Monocytes Absolute: 384 cells/uL (ref 200–950)
Monocytes Relative: 8 %
NEUTROS PCT: 64 %
Neutro Abs: 3072 cells/uL (ref 1500–7800)
Platelets: 326 10*3/uL (ref 140–400)
RBC: 4.61 MIL/uL (ref 3.80–5.10)
RDW: 13.4 % (ref 11.0–15.0)
WBC: 4.8 10*3/uL (ref 3.8–10.8)

## 2015-11-06 LAB — BASIC METABOLIC PANEL WITH GFR
BUN: 16 mg/dL (ref 7–25)
CALCIUM: 10.3 mg/dL (ref 8.6–10.4)
CHLORIDE: 104 mmol/L (ref 98–110)
CO2: 26 mmol/L (ref 20–31)
CREATININE: 0.8 mg/dL (ref 0.60–0.93)
GFR, Est African American: 86 mL/min (ref >=60)
GFR, Est Non African American: 75 mL/min (ref >=60)
GLUCOSE: 92 mg/dL (ref 65–99)
Potassium: 4.7 mmol/L (ref 3.5–5.3)
SODIUM: 146 mmol/L (ref 135–146)

## 2015-11-06 LAB — LIPID PANEL
CHOLESTEROL: 200 mg/dL (ref 125–200)
HDL: 48 mg/dL (ref >=46)
LDL Cholesterol: 127 mg/dL (ref <130)
Total CHOL/HDL Ratio: 4.2 Ratio (ref <=5.0)
Triglycerides: 125 mg/dL (ref <150)
VLDL: 25 mg/dL (ref <30)

## 2015-11-06 LAB — HEMOGLOBIN A1C
HEMOGLOBIN A1C: 5.2 % (ref <5.7)
MEAN PLASMA GLUCOSE: 103 mg/dL

## 2015-11-06 LAB — TSH: TSH: 3.03 m[IU]/L

## 2015-11-06 MED ORDER — PREDNISONE 20 MG PO TABS: ORAL_TABLET | ORAL | 0 refills | Status: DC

## 2015-11-06 MED ORDER — MECLIZINE HCL 25 MG PO TABS: 25.0000 mg | ORAL_TABLET | Freq: Three times a day (TID) | ORAL | 1 refills | Status: DC | PRN

## 2015-11-06 NOTE — Progress Notes (Signed)
[facility] Unk Pinto, M.D.        Uvaldo Bristle. Silverio Lay, P.A.-C       Starlyn Skeans, P.A.-C  [facility]                [address] [place] [id number] Telephone [phone] Telefax [phone] ______________________________________________________________________     This very nice 70 y.o. WWF presents for 3 month follow up with Hypertension, Hyperlipidemia, Pre-Diabetes and Vitamin D Deficiency. Patient also reports a 5-7 day hx consistent wit Benign Positional Vertigo with mild nausea, but no emesis and no other focal neuro sx's.     Patient is treated for HTN (1995) & BP has been controlled at home. Today's BP is 124/80. In 2008 she had a negative Stress Cardiolite.  Patient has had no complaints of any cardiac type chest pain, palpitations, dyspnea/orthopnea/PND, dizziness, claudication, or dependent edema.     Hyperlipidemia is controlled with diet & meds. Patient denies myalgias or other med SE's. Last Lipids were at goal: Lab Results  Component Value Date   CHOL 180 04/26/2015   HDL 44 (L) 04/26/2015   LDLCALC 103 04/26/2015   TRIG 165 (H) 04/26/2015   CHOLHDL 4.1 04/26/2015      Also, the patient has history of PreDiabetes circa 2011 with A1c 5.7% and has had no symptoms of reactive hypoglycemia, diabetic polys, paresthesias or visual blurring.  Last A1c was  Still not at goal: Lab Results  Component Value Date   HGBA1C 5.8 (H) 04/26/2015      Further, the patient also has history of Vitamin D Deficiency of "40" on treatment in 2008 and supplements vitamin D without any suspected side-effects. Last vitamin D was at goal: Lab Results  Component Value Date   VD25OH 62 04/26/2015   Current Outpatient Prescriptions on File Prior to Visit  Medication Sig  . ALPRAZolam (XANAX) 1 MG tablet TAKE 1/2 TO 1 TABLET 3 TIMES A DAY AS NEEDED  . aspirin 81 MG tablet Take 81 mg by mouth  daily.  [name] atorvastatin (LIPITOR) 80 MG tablet Take 1/2 to 1 tablet daily or as directed for Cholesterol  . Cholecalciferol (VITAMIN D PO) Take 2,000 Int'l Units by mouth 2 (two) times daily.   . furosemide (LASIX) 40 MG tablet TAKE 1 TABLET 1 OR 2 X DAILY FOR BP & FLUID  . GINKGO BILOBA PLUS PO Take 60 mg by mouth daily.  [name] levothyroxine (SYNTHROID, LEVOTHROID) 75 MCG tablet TAKE 1 TABLET EVERY DAY  . ranitidine (ZANTAC) 300 MG capsule Take 1 capsule (300 mg total) by mouth every evening.  . vitamin C (ASCORBIC ACID) 500 MG tablet Take 500 mg by mouth daily.   No current facility-administered medications on file prior to visit.    Allergies  Allergen Reactions  . Aspirin     REACTION: GI ulcers  . Fish Oil     Allergic to fish oil and shell fish  . Morphine And Related   . Sulfa Antibiotics   . Vicodin [Hydrocodone-Acetaminophen] Nausea Only   PMHx:   Past Medical History:  Diagnosis Date  . Vitamin D deficiency    Immunization History  Administered Date(s) Administered  . Influenza, High Dose Seasonal PF 10/18/2014  . Pneumococcal Conjugate-13 06/21/2014  . Pneumococcal Polysaccharide-23 01/21/2012  . Td 01/21/2012   Past Surgical History:  Procedure Laterality Date  .  ABDOMINAL HYSTERECTOMY  1981   partial  . APPENDECTOMY    . BACK SURGERY    . CESAREAN SECTION    . SALPINGOOPHORECTOMY  1981 left   1982 right   FHx:    Reviewed / unchanged  SHx:    Reviewed / unchanged  Systems Review:  Constitutional: Denies fever, chills, wt changes, headaches, insomnia, fatigue, night sweats, change in appetite. Eyes: Denies redness, blurred vision, diplopia, discharge, itchy, watery eyes.  ENT: Denies discharge, congestion, post nasal drip, epistaxis, sore throat, earache, hearing loss, dental pain, tinnitus, vertigo, sinus pain, snoring.  CV: Denies chest pain, palpitations, irregular heartbeat, syncope, dyspnea, diaphoresis, orthopnea, PND, claudication or  edema. Respiratory: denies cough, dyspnea, DOE, pleurisy, hoarseness, laryngitis, wheezing.  Gastrointestinal: Denies dysphagia, odynophagia, heartburn, reflux, water brash, abdominal pain or cramps, nausea, vomiting, bloating, diarrhea, constipation, hematemesis, melena, hematochezia  or hemorrhoids. Genitourinary: Denies dysuria, frequency, urgency, nocturia, hesitancy, discharge, hematuria or flank pain. Musculoskeletal: Denies arthralgias, myalgias, stiffness, jt. swelling, pain, limping or strain/sprain.  Skin: Denies pruritus, rash, hives, warts, acne, eczema or change in skin lesion(s). Neuro: No weakness, tremor, incoordination, spasms, paresthesia or pain. Psychiatric: Denies confusion, memory loss or sensory loss. Endo: Denies change in weight, skin or hair change.  Heme/Lymph: No excessive bleeding, bruising or enlarged lymph nodes.  Physical Exam BP 124/80   Pulse 72   Temp 97.9 F (36.6 C)   Ht 5\' 1"  (1.549 m)   Wt 147 lb 12.8 oz (67 kg)   BMI 27.93 kg/m   Appears well nourished and in no distress.  Eyes: PERRLA, EOMs, conjunctiva no swelling or erythema. Sinuses: No frontal/maxillary tenderness ENT/Mouth: EAC's clear, TM's nl w/o erythema, bulging. Nares clear w/o erythema, swelling, exudates. Oropharynx clear without erythema or exudates. Oral hygiene is good. Tongue normal, non obstructing. Hearing intact.  Neck: Supple. Thyroid nl. Car 2+/2+ without bruits, nodes or JVD. Chest: Respirations nl with BS clear & equal w/o rales, rhonchi, wheezing or stridor.  Cor: Heart sounds normal w/ regular rate and rhythm without sig. murmurs, gallops, clicks, or rubs. Peripheral pulses normal and equal  without edema.  Abdomen: Soft & bowel sounds normal. Non-tender w/o guarding, rebound, hernias, masses, or organomegaly.  Lymphatics: Unremarkable.  Musculoskeletal: Full ROM all peripheral extremities, joint stability, 5/5 strength, and normal gait.  Skin: Warm, dry without exposed  rashes, lesions or ecchymosis apparent.  Neuro: Cranial nerves intact, reflexes equal bilaterally. Sensory-motor testing grossly intact. Tendon reflexes grossly intact.  Pysch: Alert & oriented x 3.  Insight and judgement nl & appropriate. No ideations.  Assessment and Plan:  1. Essential hypertension  - Continue medication, monitor blood pressure at home.  - Continue DASH diet.  - Reminder to go to the ER if any CP, SOB, nausea, dizziness, severe HA, changes vision/speech, left arm numbness and tingling and jaw pain. - TSH  2. Hyperlipidemia  - Continue diet/meds, exercise,& lifestyle modifications.  - Continue monitor periodic cholesterol/liver & renal functions  - Lipid panel - TSH  3. Prediabetes  - Continue diet, exercise, lifestyle modifications. Monitor appropriate labs. - Hemoglobin A1c - Insulin, random  4. Vitamin D deficiency  - Continue supplementation. - VITAMIN D 25 Hydroxy (Vit-D Deficiency, Fractures)  5. Other abnormal glucose   6. GERD   7. Medication management  - CBC with Differential/Platelet - BASIC METABOLIC PANEL WITH GFR - Hepatic function panel - Magnesium  8. Need for prophylactic vaccination and inoculation against influenza  - Flu vaccine HIGH DOSE PF (Fluzone High  dose)  9. Benign paroxysmal positional vertigo, unspecified laterality  - predniSONE (DELTASONE) 20 MG tablet; 1 tab 3 x day for 3 days, then 1 tab 2 x day for 3 days, then 1 tab 1 x day for 5 days  Dispense: 20 tablet; Refill: 0 - meclizine (ANTIVERT) 25 MG tablet; Take 1 tablet (25 mg total) by mouth 3 (three) times daily as needed for dizziness.  Dispense: 50 tablet; Refill: 1       Recommended regular exercise, BP monitoring, weight control, and discussed med and SE's. Recommended labs to assess and monitor clinical status. Further disposition pending results of labs. Over 30 minutes of exam, counseling, chart review was performed

## 2015-11-06 NOTE — Patient Instructions (Signed)

## 2015-11-07 LAB — MAGNESIUM: Magnesium: 2.1 mg/dL (ref 1.5–2.5)

## 2015-11-07 LAB — VITAMIN D 25 HYDROXY (VIT D DEFICIENCY, FRACTURES): Vit D, 25-Hydroxy: 83 ng/mL (ref 30–100)

## 2015-11-07 LAB — INSULIN, RANDOM: Insulin: 5.4 u[IU]/mL (ref 2.0–19.6)

## 2015-11-13 ENCOUNTER — Other Ambulatory Visit: Payer: Self-pay | Admitting: Physician Assistant

## 2015-11-13 DIAGNOSIS — K2100 Gastro-esophageal reflux disease with esophagitis, without bleeding: Secondary | ICD-10-CM

## 2015-11-21 ENCOUNTER — Other Ambulatory Visit: Payer: Self-pay | Admitting: Internal Medicine

## 2015-12-13 ENCOUNTER — Other Ambulatory Visit: Payer: Self-pay | Admitting: Internal Medicine

## 2016-02-07 ENCOUNTER — Ambulatory Visit (INDEPENDENT_AMBULATORY_CARE_PROVIDER_SITE_OTHER): Payer: PPO | Admitting: Physician Assistant

## 2016-02-07 ENCOUNTER — Encounter: Payer: Self-pay | Admitting: Physician Assistant

## 2016-02-07 VITALS — BP 100/60 | HR 66 | Temp 97.3°F | Resp 14 | Ht 61.0 in | Wt 148.6 lb

## 2016-02-07 DIAGNOSIS — Z1159 Encounter for screening for other viral diseases: Secondary | ICD-10-CM | POA: Diagnosis not present

## 2016-02-07 DIAGNOSIS — I10 Essential (primary) hypertension: Secondary | ICD-10-CM

## 2016-02-07 DIAGNOSIS — Z0001 Encounter for general adult medical examination with abnormal findings: Secondary | ICD-10-CM | POA: Diagnosis not present

## 2016-02-07 DIAGNOSIS — K2100 Gastro-esophageal reflux disease with esophagitis, without bleeding: Secondary | ICD-10-CM

## 2016-02-07 DIAGNOSIS — Z0000 Encounter for general adult medical examination without abnormal findings: Secondary | ICD-10-CM

## 2016-02-07 DIAGNOSIS — E559 Vitamin D deficiency, unspecified: Secondary | ICD-10-CM

## 2016-02-07 DIAGNOSIS — E782 Mixed hyperlipidemia: Secondary | ICD-10-CM

## 2016-02-07 DIAGNOSIS — F325 Major depressive disorder, single episode, in full remission: Secondary | ICD-10-CM | POA: Diagnosis not present

## 2016-02-07 DIAGNOSIS — R7309 Other abnormal glucose: Secondary | ICD-10-CM

## 2016-02-07 DIAGNOSIS — H8110 Benign paroxysmal vertigo, unspecified ear: Secondary | ICD-10-CM

## 2016-02-07 DIAGNOSIS — R6889 Other general symptoms and signs: Secondary | ICD-10-CM | POA: Diagnosis not present

## 2016-02-07 DIAGNOSIS — Z79899 Other long term (current) drug therapy: Secondary | ICD-10-CM | POA: Diagnosis not present

## 2016-02-07 LAB — CBC WITH DIFFERENTIAL/PLATELET
BASOS ABS: 66 {cells}/uL (ref 0–200)
BASOS PCT: 1 %
Eosinophils Absolute: 198 cells/uL (ref 15–500)
Eosinophils Relative: 3 %
HCT: 41.5 % (ref 35.0–45.0)
HEMOGLOBIN: 13.8 g/dL (ref 11.7–15.5)
LYMPHS ABS: 1914 {cells}/uL (ref 850–3900)
Lymphocytes Relative: 29 %
MCH: 30.1 pg (ref 27.0–33.0)
MCHC: 33.3 g/dL (ref 32.0–36.0)
MCV: 90.6 fL (ref 80.0–100.0)
MPV: 10.6 fL (ref 7.5–12.5)
Monocytes Absolute: 594 cells/uL (ref 200–950)
Monocytes Relative: 9 %
NEUTROS ABS: 3828 {cells}/uL (ref 1500–7800)
Neutrophils Relative %: 58 %
Platelets: 315 10*3/uL (ref 140–400)
RBC: 4.58 MIL/uL (ref 3.80–5.10)
RDW: 13.4 % (ref 11.0–15.0)
WBC: 6.6 10*3/uL (ref 3.8–10.8)

## 2016-02-07 LAB — TSH: TSH: 1.43 mIU/L

## 2016-02-07 NOTE — Progress Notes (Signed)
Follow up and Medicare wellness Assessment:    HYPERTENSION - CBC with Differential - BASIC METABOLIC PANEL WITH GFR - Hepatic function panel - TSH   Hyperlipidemia - Lipid panel  Other abnormal glucose Discussed general issues about diabetes pathophysiology and management., Educational material distributed., Suggested low cholesterol diet., Encouraged aerobic exercise., Discussed foot care., Reminded to get yearly retinal exam. - Hemoglobin A1c - Insulin, fasting   Unspecified vitamin D deficiency - Vit D  25 hydroxy (rtn osteoporosis monitoring)   Depression, remission depression, in remission  Encounter for long-term (current) use of other medications - Magnesium  Hypothyroidism -check TSH level, continue medications the same, reminded to take on an empty stomach 30-41mins before food.   Future Appointments Date Time Provider [place]  06/11/2016 3:00 PM Unk Pinto, MD [facility] None     Plan:   During the course of the visit the patient was educated and counseled about appropriate screening and preventive services including:    Pneumococcal vaccine   Influenza vaccine  Td vaccine  Screening electrocardiogram  Screening mammography  Bone densitometry screening  Colorectal cancer screening  Diabetes screening  Glaucoma screening  Advanced directives: unknown   Subjective:   [name] is a 71 y.o. female who presents for Medicare Annual Wellness Visit and 3 month follow up on hypertension, prediabetes, hyperlipidemia, vitamin D def.   Her blood pressure has been controlled at home, she is on one a day, today their BP is BP: 100/60, she is on bASA every other day.  She does workout, going to senior center. She denies chest pain, shortness of breath, dizziness.  She is on cholesterol medication, she is on lipitor she is on 1/4 T,T,S and she is having muscle aches at night . Her cholesterol is not at goal. The cholesterol last  visit was:   Lab Results  Component Value Date   CHOL 200 11/06/2015   HDL 48 11/06/2015   LDLCALC 127 11/06/2015   TRIG 125 11/06/2015   CHOLHDL 4.2 11/06/2015    Last A1C in the office was:  Lab Results  Component Value Date   HGBA1C 5.2 11/06/2015   Patient is on Vitamin D supplement. She is on thyroid medication. Her medication was not changed last visit. Patient denies heat / cold intolerance, nervousness and palpitations.  Lab Results  Component Value Date   TSH 3.03 11/06/2015  .  Husband passed Jan 24th 2016, she is selling her house and this is causing some worsening anxiety, has taken 2 xanax for sleep occ.   Will have occ mid line thoracic back pain that will radiate to her right shoulder, no AB pain, N/V, worse with standing for a long time.  Has occ left mid back pain, positional, not with exertion, no CP, SOB, no diarrhea, constipation. Body mass index is 28.08 kg/m. Wt Readings from Last 3 Encounters:  02/07/16 148 lb 9.6 oz (67.4 kg)  11/06/15 147 lb 12.8 oz (67 kg)  08/03/15 148 lb (67.1 kg)    Names of Other Physician/Practitioners you currently use: 1. [facility]- here for primary care 2. Dr. 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 Does not see a dentist Patient Care Team: Unk Pinto, MD as PCP - General (Internal Medicine) Lafayette Dragon, MD (Inactive) as Consulting Physician (Gastroenterology)  Medication Review Current Outpatient Prescriptions on File Prior to Visit  Medication Sig Dispense Refill  . ALPRAZolam (  XANAX) 1 MG tablet TAKE 1/2 TO 1 TABLET 3 TIMES A DAY AS NEEDED 90 tablet 1  . aspirin 81 MG tablet Take 81 mg by mouth daily.    [name] atorvastatin (LIPITOR) 80 MG tablet Take 1/2 to 1 tablet daily or as directed for Cholesterol 30 tablet 11  . Cholecalciferol (VITAMIN D PO) Take 2,000 Int'l Units by mouth 2 (two) times daily.     .  furosemide (LASIX) 40 MG tablet TAKE 1 TABLET 1 OR 2 X DAILY FOR BP & FLUID 180 tablet 1  . GINKGO BILOBA PLUS PO Take 60 mg by mouth daily.    [name] levothyroxine (SYNTHROID, LEVOTHROID) 75 MCG tablet TAKE 1 TABLET EVERY DAY 90 tablet 1  . meclizine (ANTIVERT) 25 MG tablet Take 1 tablet (25 mg total) by mouth 3 (three) times daily as needed for dizziness. 50 tablet 1  . ranitidine (ZANTAC) 300 MG capsule TAKE ONE CAPSULE BY MOUTH EVERY EVENING 90 capsule 0  . vitamin C (ASCORBIC ACID) 500 MG tablet Take 500 mg by mouth daily.     No current facility-administered medications on file prior to visit.     Current Problems (verified) Patient Active Problem List   Diagnosis Date Noted  . Medicare annual wellness visit, subsequent 09/21/2014  . Medication management 08/15/2013  . Other abnormal glucose 01/26/2013  . Vitamin D deficiency 12/13/2012  . GERD   . Hyperlipidemia   . Depression, major, in remission ([place])   . Essential hypertension 09/09/2008    Screening Tests Immunization History  Administered Date(s) Administered  . Influenza, High Dose Seasonal PF 10/18/2014, 11/06/2015  . Pneumococcal Conjugate-13 06/21/2014  . Pneumococcal Polysaccharide-23 01/21/2012  . Td 01/21/2012    Preventative care: Last colonoscopy: 07/2008 Last mammogram: 09/2015 Last pap smear/pelvic exam: remote, declines DEXA: 08/2014, NORMAL  Prior vaccinations: TD or Tdap: 2014  Influenza: 2017 Pneumococcal: 2014 Prevnar 13: 2016 Shingles/Zostavax: declines  Allergies Allergies  Allergen Reactions  . Aspirin     REACTION: GI ulcers  . Fish Oil     Allergic to fish oil and shell fish  . Morphine And Related   . Sulfa Antibiotics   . Vicodin [Hydrocodone-Acetaminophen] Nausea Only    SURGICAL HISTORY She  has a past surgical history that includes Back surgery; Appendectomy; Cesarean section; Abdominal hysterectomy (1981); and Salpingoophorectomy (1981 left). FAMILY HISTORY Her family  history includes Diabetes in her mother; Heart disease in her father, mother, and sister; Hypertension in her father; Kidney disease in her mother. SOCIAL HISTORY She  reports that she quit smoking about 8 years ago. She has never used smokeless tobacco. She reports that she does not drink alcohol or use drugs.  .  Objective:   Blood pressure 100/60, pulse 66, temperature 97.3 F (36.3 C), resp. rate 14, height 5\' 1"  (1.549 m), weight 148 lb 9.6 oz (67.4 kg), SpO2 99 %. Body mass index is 28.08 kg/m.  General appearance: alert, no distress, WD/WN,  female HEENT: normocephalic, sclerae anicteric, TMs pearly, nares patent, no discharge or erythema, pharynx normal Oral cavity: MMM, no lesions Neck: supple, no lymphadenopathy, no thyromegaly, no masses Heart: RRR, normal S1, S2, no murmurs Lungs: CTA bilaterally, no wheezes, rhonchi, or rales Abdomen: +bs, soft, non tender, non distended, no masses, no hepatomegaly, no splenomegaly Musculoskeletal: nontender, no swelling, no obvious deformity Extremities: no edema, no cyanosis, no clubbing Pulses: 2+ symmetric, upper and lower extremities, normal cap refill Neurological: alert, oriented x 3, CN2-12 intact, strength normal upper extremities  and lower extremities, sensation normal throughout, DTRs 2+ throughout, no cerebellar signs, gait normal Psychiatric: normal affect, behavior normal, pleasant  Breast: defer Gyn: defer Rectal: defer    Medicare Attestation I have personally reviewed: The patient's medical and social history Their use of alcohol, tobacco or illicit drugs Their current medications and supplements The patient's functional ability including ADLs,fall risks, home safety risks, cognitive, and hearing and visual impairment Diet and physical activities Evidence for depression or mood disorders  The patient's weight, height, BMI, and visual acuity have been recorded in the chart.  I have made referrals, counseling, and  provided education to the patient based on review of the above and I have provided the patient with a written personalized care plan for preventive services.     Vicie Mutters, PA-C   02/07/2016

## 2016-02-07 NOTE — Patient Instructions (Signed)
Thoracic Strain A thoracic strain, which is sometimes called a mid-back strain, is an injury to the muscles or tendons that attach to the upper part of your back behind your chest. This type of injury occurs when a muscle is overstretched or overloaded. Thoracic strains can range from mild to severe. Mild strains may involve stretching a muscle or tendon without tearing it. These injuries may heal in 1-2 weeks. More severe strains involve tearing of muscle fibers or tendons. These will cause more pain and may take 6-8 weeks to heal. What are the causes? This condition may be caused by:  An injury in which a sudden force is placed on the muscle.  Exercising without properly warming up.  Overuse of the muscle.  Improper form during certain movements.  Other injuries that surround or cause stress on the mid-back, causing a strain on the muscles. In some cases, the cause may not be known. What increases the risk? This injury is more common in:  Athletes.  People with obesity. What are the signs or symptoms? The main symptom of this condition is pain, especially with movement. Other symptoms include:  Bruising.  Swelling.  Spasm. How is this diagnosed? This condition may be diagnosed with a physical exam. X-rays may be taken to check for a fracture. How is this treated? This condition may be treated with:  Resting and icing the injured area.  Physical therapy. This will involve doing stretching and strengthening exercises.  Medicines for pain and inflammation. Follow these instructions at home:  Rest as needed. Follow instructions from your health care provider about any restrictions on activity.  If directed, apply ice to the injured area:  Put ice in a plastic bag.  Place a towel between your skin and the bag.  Leave the ice on for 20 minutes, 2-3 times per day.  Take over-the-counter and prescription medicines only as told by your health care provider.  Begin doing  exercises as told by your health care provider or physical therapist.  Always warm up properly before physical activity or sports.  Bend your knees before you lift heavy objects.  Keep all follow-up visits as told by your health care provider. This is important. Contact a health care provider if:  Your pain is not helped by medicine.  Your pain, bruising, or swelling is getting worse.  You have a fever. Get help right away if:  You have shortness of breath.  You have chest pain.  You develop numbness or weakness in your legs.  You have involuntary loss of urine (urinary incontinence). This information is not intended to replace advice given to you by your health care provider. Make sure you discuss any questions you have with your health care provider. Document Released: 03/15/2003 Document Revised: 08/25/2015 Document Reviewed: 02/16/2014 Elsevier Interactive Patient Education  2017 [employer]vier Inc.  

## 2016-02-08 LAB — BASIC METABOLIC PANEL WITH GFR
BUN: 19 mg/dL (ref 7–25)
CHLORIDE: 102 mmol/L (ref 98–110)
CO2: 29 mmol/L (ref 20–31)
Calcium: 9.9 mg/dL (ref 8.6–10.4)
Creat: 0.85 mg/dL (ref 0.60–0.93)
GFR, EST NON AFRICAN AMERICAN: 69 mL/min (ref >=60)
GFR, Est African American: 80 mL/min (ref >=60)
Glucose, Bld: 77 mg/dL (ref 65–99)
POTASSIUM: 4.8 mmol/L (ref 3.5–5.3)
SODIUM: 143 mmol/L (ref 135–146)

## 2016-02-08 LAB — HEPATIC FUNCTION PANEL
ALT: 12 U/L (ref 6–29)
AST: 23 U/L (ref 10–35)
Albumin: 4.5 g/dL (ref 3.6–5.1)
Alkaline Phosphatase: 61 U/L (ref 33–130)
BILIRUBIN DIRECT: 0.1 mg/dL (ref <=0.2)
BILIRUBIN INDIRECT: 0.4 mg/dL (ref 0.2–1.2)
Total Bilirubin: 0.5 mg/dL (ref 0.2–1.2)
Total Protein: 7.3 g/dL (ref 6.1–8.1)

## 2016-02-08 LAB — HEPATITIS C ANTIBODY: HCV AB: NEGATIVE

## 2016-02-08 LAB — MAGNESIUM: MAGNESIUM: 2.2 mg/dL (ref 1.5–2.5)

## 2016-02-10 ENCOUNTER — Other Ambulatory Visit: Payer: Self-pay | Admitting: Internal Medicine

## 2016-02-10 DIAGNOSIS — K2100 Gastro-esophageal reflux disease with esophagitis, without bleeding: Secondary | ICD-10-CM

## 2016-02-16 ENCOUNTER — Other Ambulatory Visit: Payer: Self-pay | Admitting: Internal Medicine

## 2016-02-16 DIAGNOSIS — K2100 Gastro-esophageal reflux disease with esophagitis, without bleeding: Secondary | ICD-10-CM

## 2016-02-26 DIAGNOSIS — H43812 Vitreous degeneration, left eye: Secondary | ICD-10-CM | POA: Diagnosis not present

## 2016-03-16 ENCOUNTER — Other Ambulatory Visit: Payer: Self-pay | Admitting: Internal Medicine

## 2016-03-16 NOTE — Telephone Encounter (Signed)
Please call Alprazolam 

## 2016-05-06 ENCOUNTER — Other Ambulatory Visit: Payer: Self-pay | Admitting: Internal Medicine

## 2016-05-06 DIAGNOSIS — E782 Mixed hyperlipidemia: Secondary | ICD-10-CM

## 2016-06-08 ENCOUNTER — Other Ambulatory Visit: Payer: Self-pay | Admitting: Internal Medicine

## 2016-06-11 ENCOUNTER — Encounter: Payer: Self-pay | Admitting: Internal Medicine

## 2016-06-11 ENCOUNTER — Ambulatory Visit (INDEPENDENT_AMBULATORY_CARE_PROVIDER_SITE_OTHER): Payer: PPO | Admitting: Internal Medicine

## 2016-06-11 VITALS — BP 108/70 | HR 64 | Temp 97.3°F | Resp 16 | Ht 61.0 in | Wt 147.2 lb

## 2016-06-11 DIAGNOSIS — Z79899 Other long term (current) drug therapy: Secondary | ICD-10-CM

## 2016-06-11 DIAGNOSIS — Z1212 Encounter for screening for malignant neoplasm of rectum: Secondary | ICD-10-CM | POA: Diagnosis not present

## 2016-06-11 DIAGNOSIS — E039 Hypothyroidism, unspecified: Secondary | ICD-10-CM

## 2016-06-11 DIAGNOSIS — R7309 Other abnormal glucose: Secondary | ICD-10-CM

## 2016-06-11 DIAGNOSIS — I10 Essential (primary) hypertension: Secondary | ICD-10-CM | POA: Diagnosis not present

## 2016-06-11 DIAGNOSIS — E559 Vitamin D deficiency, unspecified: Secondary | ICD-10-CM

## 2016-06-11 DIAGNOSIS — Z136 Encounter for screening for cardiovascular disorders: Secondary | ICD-10-CM

## 2016-06-11 DIAGNOSIS — R7303 Prediabetes: Secondary | ICD-10-CM

## 2016-06-11 DIAGNOSIS — E782 Mixed hyperlipidemia: Secondary | ICD-10-CM

## 2016-06-11 LAB — CBC WITH DIFFERENTIAL/PLATELET
Basophils Absolute: 0 cells/uL (ref 0–200)
Basophils Relative: 0 %
EOS ABS: 74 {cells}/uL (ref 15–500)
Eosinophils Relative: 1 %
HEMATOCRIT: 39.6 % (ref 35.0–45.0)
HEMOGLOBIN: 13.1 g/dL (ref 11.7–15.5)
Lymphocytes Relative: 17 %
Lymphs Abs: 1258 cells/uL (ref 850–3900)
MCH: 30 pg (ref 27.0–33.0)
MCHC: 33.1 g/dL (ref 32.0–36.0)
MCV: 90.8 fL (ref 80.0–100.0)
MONO ABS: 518 {cells}/uL (ref 200–950)
MONOS PCT: 7 %
MPV: 10.3 fL (ref 7.5–12.5)
NEUTROS ABS: 5550 {cells}/uL (ref 1500–7800)
Neutrophils Relative %: 75 %
PLATELETS: 321 10*3/uL (ref 140–400)
RBC: 4.36 MIL/uL (ref 3.80–5.10)
RDW: 13.8 % (ref 11.0–15.0)
WBC: 7.4 10*3/uL (ref 3.8–10.8)

## 2016-06-11 NOTE — Progress Notes (Signed)
[facility] [name], M.D.      [name]. [name], P.A.-C [facility]                [address] [place], [place] [address] Telephone [phone] Telefax [phone]   Comprehensive Evaluation &  Examination     This very nice 71 y.o. [facility] presents for a  comprehensive evaluation and management of multiple medical co-morbidities.  Patient has been followed for HTN, Prediabetes, Hyperlipidemia and Vitamin D Deficiency.      HTN predates since 1995. Patient's BP has been controlled at home and patient denies any cardiac symptoms as chest pain, palpitations, shortness of breath, dizziness or ankle swelling. Today's BP is at goal 108/70.      Patient's hyperlipidemia is controlled with diet and medications. Patient denies myalgias or other medication SE's. Last lipids were not at goal: Lab Results  Component Value Date   CHOL 200 11/06/2015   HDL 48 11/06/2015   LDLCALC 127 11/06/2015   TRIG 125 11/06/2015   CHOLHDL 4.2 11/06/2015      Patient has prediabetes (A1c 5.7% in 2011) and patient denies reactive hypoglycemic symptoms, visual blurring, diabetic polys, or paresthesias. Last A1c was at goal: Lab Results  Component Value Date   HGBA1C 5.2 11/06/2015      Patient has been on Thyroid Replacement since 2009.  Finally, patient has history of Vitamin D Deficiency  ("40" in 2008) and last Vitamin D was at goal - "2" in Oct 2017.  Current Outpatient Prescriptions on File Prior to Visit  Medication Sig  . ALPRAZolam (XANAX) 1 MG tablet TAKE 1/2-1 TABLET BY MOUTH 3 TIMES A DAY AS NEEDED FOR ANXIETY  . aspirin 81 MG tablet Take 81 mg by mouth daily.  [name] atorvastatin (LIPITOR) 80 MG tablet TAKE 1/2 TO 1 TABLET DAILY OR AS DIRECTED FOR CHOLESTEROL  . Cholecalciferol (VITAMIN D PO) Take 2,000 Int'l Units by mouth 2 (two) times daily.   . furosemide (LASIX) 40 MG tablet TAKE 1 TABLET 1 OR 2 X DAILY  FOR BP & FLUID  . GINKGO BILOBA PLUS PO Take 60 mg by mouth daily.  [name] levothyroxine (SYNTHROID, LEVOTHROID) 75 MCG tablet TAKE 1 TABLET EVERY DAY  . ranitidine (ZANTAC) 300 MG tablet TAKE 1 TABLET IN THE EVENING  . vitamin C (ASCORBIC ACID) 500 MG tablet Take 500 mg by mouth daily.   No current facility-administered medications on file prior to visit.    Allergies  Allergen Reactions  . Aspirin     REACTION: GI ulcers  . Fish Oil     Allergic to fish oil and shell fish  . Morphine And Related   . Sulfa Antibiotics   . Vicodin [Hydrocodone-Acetaminophen] Nausea Only   Past Medical History:  Diagnosis Date  . Vitamin D deficiency    Health Maintenance  Topic Date Due  . INFLUENZA VACCINE  08/06/2016  . MAMMOGRAM  09/13/2017  . COLONOSCOPY  07/07/2018  . TETANUS/TDAP  01/20/2022  . DEXA SCAN  Completed  . Hepatitis C Screening  Completed  . PNA vac Low Risk Adult  Completed   Immunization History  Administered Date(s) Administered  . Influenza, High Dose Seasonal PF 10/18/2014, 11/06/2015  . Pneumococcal Conjugate-13 06/21/2014  . Pneumococcal Polysaccharide-23 01/21/2012  . Td 01/21/2012   Past Surgical History:  Procedure Laterality Date  . ABDOMINAL  HYSTERECTOMY  1981   partial  . APPENDECTOMY    . BACK SURGERY    . CESAREAN SECTION    . SALPINGOOPHORECTOMY  1981 left   1982 right   Family History  Problem Relation Age of Onset  . Diabetes Mother   . Kidney disease Mother   . Heart disease Mother   . Hypertension Father   . Heart disease Father   . Heart disease Sister    Social History  Substance Use Topics  . Smoking status: Former Smoker    Quit date: 01/07/2008  . Smokeless tobacco: Never Used  . Alcohol use No    ROS Constitutional: Denies fever, chills, weight loss/gain, headaches, insomnia,  night sweats, and change in appetite. Does c/o fatigue. Eyes: Denies redness, blurred vision, diplopia, discharge, itchy, watery eyes.  ENT: Denies  discharge, congestion, post nasal drip, epistaxis, sore throat, earache, hearing loss, dental pain, Tinnitus, Vertigo, Sinus pain, snoring.  Cardio: Denies chest pain, palpitations, irregular heartbeat, syncope, dyspnea, diaphoresis, orthopnea, PND, claudication, edema Respiratory: denies cough, dyspnea, DOE, pleurisy, hoarseness, laryngitis, wheezing.  Gastrointestinal: Denies dysphagia, heartburn, reflux, water brash, pain, cramps, nausea, vomiting, bloating, diarrhea, constipation, hematemesis, melena, hematochezia, jaundice, hemorrhoids Genitourinary: Denies dysuria, frequency, urgency, nocturia, hesitancy, discharge, hematuria, flank pain Breast: Breast lumps, nipple discharge, bleeding.  Musculoskeletal: Denies arthralgia, myalgia, stiffness, Jt. Swelling, pain, limp, and strain/sprain. Denies falls. Skin: Denies puritis, rash, hives, warts, acne, eczema, changing in skin lesion Neuro: No weakness, tremor, incoordination, spasms, paresthesia, pain Psychiatric: Denies confusion, memory loss, sensory loss. Denies Depression. Endocrine: Denies change in weight, skin, hair change, nocturia, and paresthesia, diabetic polys, visual blurring, hyper / hypo glycemic episodes.  Heme/Lymph: No excessive bleeding, bruising, enlarged lymph nodes.  Physical Exam  BP 108/70   Pulse 64   Temp 97.3 F (36.3 C)   Resp 16   Ht 5\' 1"  (1.549 m)   Wt 147 lb 3.2 oz (66.8 kg)   BMI 27.81 kg/m   General Appearance: Well nourished, well groomed and in no apparent distress.  Eyes: PERRLA, EOMs, conjunctiva no swelling or erythema, normal fundi and vessels. Sinuses: No frontal/maxillary tenderness ENT/Mouth: EACs patent / TMs  nl. Nares clear without erythema, swelling, mucoid exudates. Oral hygiene is good. No erythema, swelling, or exudate. Tongue normal, non-obstructing. Tonsils not swollen or erythematous. Hearing normal.  Neck: Supple, thyroid normal. No bruits, nodes or JVD. Respiratory: Respiratory  effort normal.  BS equal and clear bilateral without rales, rhonci, wheezing or stridor. Cardio: Heart sounds are normal with regular rate and rhythm and no murmurs, rubs or gallops. Peripheral pulses are normal and equal bilaterally without edema. No aortic or femoral bruits. Chest: symmetric with normal excursions and percussion. Breasts: Symmetric, without lumps, nipple discharge, retractions, or fibrocystic changes.  Abdomen: Flat, soft with bowel sounds active. Nontender, no guarding, rebound, hernias, masses, or organomegaly.  Lymphatics: Non tender without lymphadenopathy.  Musculoskeletal: Full ROM all peripheral extremities, joint stability, 5/5 strength, and normal gait. Skin: Warm and dry without rashes, lesions, cyanosis, clubbing or  ecchymosis.  Neuro: Cranial nerves intact, reflexes equal bilaterally. Normal muscle tone, no cerebellar symptoms. Sensation intact.  Pysch: Alert and oriented X 3, normal affect, Insight and Judgment appropriate.   Assessment and Plan  1. Essential hypertension  - EKG 12-Lead - Urinalysis, Routine w reflex microscopic - Microalbumin / creatinine urine ratio - CBC with Differential/Platelet - BASIC METABOLIC PANEL WITH GFR - Magnesium - TSH  2. Hyperlipidemia, mixed  - EKG 12-Lead - Hepatic  function panel - Lipid panel - TSH  3. Prediabetes  - EKG 12-Lead - Hemoglobin A1c - Insulin, random  4. Vitamin D deficiency  - VITAMIN D 25 Hydroxy   5. Other abnormal glucose  - Hemoglobin A1c - Insulin, random  6. Hypothyroidism, unspecified type   7. Screening for rectal cancer  - POC Hemoccult Bld/Stl - TSH  8. Screening for ischemic heart disease  - EKG 12-Lead  9. Medication management  - Urinalysis, Routine w reflex microscopic - Microalbumin / creatinine urine ratio - CBC with Differential/Platelet - BASIC METABOLIC PANEL WITH GFR - Hepatic function panel - Magnesium - Lipid panel - TSH - Hemoglobin A1c -  Insulin, random - VITAMIN D 25 Hydroxy        Patient was counseled in prudent diet to achieve/maintain BMI less than 25 for weight control, BP monitoring, regular exercise and medications. Discussed med's effects and SE's. Screening labs and tests as requested with regular follow-up as recommended. Over 40 minutes of exam, counseling, chart review and high complex critical decision making was performed. '

## 2016-06-11 NOTE — Patient Instructions (Signed)

## 2016-06-12 ENCOUNTER — Other Ambulatory Visit: Payer: Self-pay | Admitting: Internal Medicine

## 2016-06-12 DIAGNOSIS — H8110 Benign paroxysmal vertigo, unspecified ear: Secondary | ICD-10-CM

## 2016-06-12 LAB — HEMOGLOBIN A1C
Hgb A1c MFr Bld: 5.4 % (ref <5.7)
MEAN PLASMA GLUCOSE: 108 mg/dL

## 2016-06-12 LAB — URINALYSIS, ROUTINE W REFLEX MICROSCOPIC
Bilirubin Urine: NEGATIVE
Glucose, UA: NEGATIVE
HGB URINE DIPSTICK: NEGATIVE
KETONES UR: NEGATIVE
NITRITE: NEGATIVE
PROTEIN: NEGATIVE
Specific Gravity, Urine: 1.01 (ref 1.001–1.035)
pH: 6.5 (ref 5.0–8.0)

## 2016-06-12 LAB — URINALYSIS, MICROSCOPIC ONLY
Bacteria, UA: NONE SEEN [HPF]
Casts: NONE SEEN [LPF]
Crystals: NONE SEEN [HPF]
RBC / HPF: NONE SEEN RBC/HPF (ref <=2)
Squamous Epithelial / LPF: NONE SEEN [HPF] (ref <=5)
WBC UA: NONE SEEN WBC/HPF (ref <=5)
Yeast: NONE SEEN [HPF]

## 2016-06-12 LAB — BASIC METABOLIC PANEL WITH GFR
BUN: 22 mg/dL (ref 7–25)
CALCIUM: 9.4 mg/dL (ref 8.6–10.4)
CO2: 27 mmol/L (ref 20–31)
Chloride: 101 mmol/L (ref 98–110)
Creat: 0.72 mg/dL (ref 0.60–0.93)
GFR, EST NON AFRICAN AMERICAN: 85 mL/min (ref >=60)
Glucose, Bld: 91 mg/dL (ref 65–99)
POTASSIUM: 4.1 mmol/L (ref 3.5–5.3)
Sodium: 140 mmol/L (ref 135–146)

## 2016-06-12 LAB — MICROALBUMIN / CREATININE URINE RATIO
Creatinine, Urine: 46 mg/dL (ref 20–320)
MICROALB UR: 0.6 mg/dL
Microalb Creat Ratio: 13 mcg/mg creat (ref <30)

## 2016-06-12 LAB — LIPID PANEL
CHOL/HDL RATIO: 4.6 ratio (ref <5.0)
CHOLESTEROL: 192 mg/dL (ref <200)
HDL: 42 mg/dL — AB (ref >50)
LDL Cholesterol: 114 mg/dL — ABNORMAL HIGH (ref <100)
TRIGLYCERIDES: 180 mg/dL — AB (ref <150)
VLDL: 36 mg/dL — AB (ref <30)

## 2016-06-12 LAB — HEPATIC FUNCTION PANEL
ALT: 16 U/L (ref 6–29)
AST: 24 U/L (ref 10–35)
Albumin: 4.5 g/dL (ref 3.6–5.1)
Alkaline Phosphatase: 60 U/L (ref 33–130)
BILIRUBIN DIRECT: 0.1 mg/dL (ref <=0.2)
BILIRUBIN INDIRECT: 0.4 mg/dL (ref 0.2–1.2)
BILIRUBIN TOTAL: 0.5 mg/dL (ref 0.2–1.2)
TOTAL PROTEIN: 7.1 g/dL (ref 6.1–8.1)

## 2016-06-12 LAB — INSULIN, RANDOM: Insulin: 10.3 u[IU]/mL (ref 2.0–19.6)

## 2016-06-12 LAB — VITAMIN D 25 HYDROXY (VIT D DEFICIENCY, FRACTURES): Vit D, 25-Hydroxy: 88 ng/mL (ref 30–100)

## 2016-06-12 LAB — TSH: TSH: 1.55 mIU/L

## 2016-06-12 LAB — MAGNESIUM: Magnesium: 2.1 mg/dL (ref 1.5–2.5)

## 2016-07-10 ENCOUNTER — Other Ambulatory Visit: Payer: Self-pay | Admitting: *Deleted

## 2016-07-10 DIAGNOSIS — Z1212 Encounter for screening for malignant neoplasm of rectum: Secondary | ICD-10-CM

## 2016-07-10 LAB — POC HEMOCCULT BLD/STL (HOME/3-CARD/SCREEN)
Card #3 Fecal Occult Blood, POC: NEGATIVE
FECAL OCCULT BLD: NEGATIVE
Fecal Occult Blood, POC: NEGATIVE

## 2016-07-13 ENCOUNTER — Other Ambulatory Visit: Payer: Self-pay | Admitting: Internal Medicine

## 2016-07-13 NOTE — Telephone Encounter (Signed)
Please call Alpraz  

## 2016-08-04 ENCOUNTER — Encounter: Payer: Self-pay | Admitting: Physician Assistant

## 2016-09-10 ENCOUNTER — Other Ambulatory Visit: Payer: Self-pay | Admitting: Internal Medicine

## 2016-09-10 DIAGNOSIS — Z1231 Encounter for screening mammogram for malignant neoplasm of breast: Secondary | ICD-10-CM

## 2016-09-16 ENCOUNTER — Encounter: Payer: Self-pay | Admitting: Physician Assistant

## 2016-09-16 ENCOUNTER — Ambulatory Visit (INDEPENDENT_AMBULATORY_CARE_PROVIDER_SITE_OTHER): Payer: PPO | Admitting: Physician Assistant

## 2016-09-16 VITALS — BP 120/70 | HR 65 | Temp 97.5°F | Resp 14 | Ht 61.0 in | Wt 144.0 lb

## 2016-09-16 DIAGNOSIS — R310 Gross hematuria: Secondary | ICD-10-CM | POA: Diagnosis not present

## 2016-09-16 MED ORDER — CIPROFLOXACIN HCL 500 MG PO TABS: 500.0000 mg | ORAL_TABLET | Freq: Two times a day (BID) | ORAL | 0 refills | Status: DC

## 2016-09-16 NOTE — Patient Instructions (Signed)

## 2016-09-16 NOTE — Progress Notes (Signed)
Subjective:    Patient ID: [name], female    DOB: [birth year], 71 y.o.   MRN: [id number]  HPI 71 y.o. Q2010 smoked  X 14-15 years 1/2 pack every other day WF s/p TAH presents with hematuria x 1 day.  This AM woke up, when went to urinate had AB pressure, clotted blood in the toilet and with subsquent urination had a stream of blood, urinary frequency, no fever or chills.   Blood pressure 120/70, pulse 65, temperature (!) 97.5 F (36.4 C), resp. rate 14, height 5\' 1"  (1.549 m), weight 144 lb (65.3 kg), SpO2 95 %.  Medications Current Outpatient Prescriptions on File Prior to Visit  Medication Sig  . ALPRAZolam (XANAX) 1 MG tablet TAKE 1/2-1 TABLET BY MOUTH 3 TIMES A DAY AS NEEDED FOR ANXIETY  . aspirin 81 MG tablet Take 81 mg by mouth daily.  [name] atorvastatin (LIPITOR) 80 MG tablet TAKE 1/2 TO 1 TABLET DAILY OR AS DIRECTED FOR CHOLESTEROL  . Cholecalciferol (VITAMIN D PO) Take 2,000 Int'l Units by mouth 2 (two) times daily.   . furosemide (LASIX) 40 MG tablet TAKE 1 TABLET 1 OR 2 X DAILY FOR BP & FLUID  . GINKGO BILOBA PLUS PO Take 60 mg by mouth daily.  [name] levothyroxine (SYNTHROID, LEVOTHROID) 75 MCG tablet TAKE 1 TABLET EVERY DAY  . OVER THE COUNTER MEDICATION Drinks Petra Kuba Made protein drink after exercise.  . ranitidine (ZANTAC) 300 MG tablet TAKE 1 TABLET IN THE EVENING  . vitamin C (ASCORBIC ACID) 500 MG tablet Take 500 mg by mouth daily.   No current facility-administered medications on file prior to visit.     Problem list She has Essential hypertension; GERD; Hyperlipidemia; Depression, major, in remission ([place]); Vitamin D deficiency; Other abnormal glucose; Medication management; Medicare annual wellness visit, subsequent; and Benign paroxysmal positional vertigo on her problem list.  Social History  Substance Use Topics  . Smoking status: Former Smoker    Quit date: 01/07/2008  . Smokeless tobacco: Never Used  . Alcohol use No    Review of Systems   Constitutional: Negative for chills and fever.  HENT: Negative.   Respiratory: Negative.   Cardiovascular: Negative.   Gastrointestinal: Negative.  Negative for nausea and vomiting.  Genitourinary: Positive for frequency, hematuria, pelvic pain and urgency. Negative for decreased urine volume, difficulty urinating, dyspareunia, dysuria, enuresis, flank pain, genital sores, menstrual problem, vaginal bleeding, vaginal discharge and vaginal pain.       Objective:   Physical Exam  Constitutional: She is oriented to person, place, and time. She appears well-developed and well-nourished.  Neck: Normal range of motion. Neck supple.  Cardiovascular: Normal rate and regular rhythm.   Pulmonary/Chest: Effort normal and breath sounds normal.  Abdominal: Soft. Bowel sounds are normal. She exhibits no distension and no mass. There is tenderness (mild suprapubic without distended bladder). There is no rebound and no guarding.  Musculoskeletal: Normal range of motion. She exhibits no tenderness.  Neurological: She is alert and oriented to person, place, and time.  Skin: Skin is warm and dry.       Assessment & Plan:   Gross hematuria With smoking history and gross hematuria will refer to urology for evaluation If any worsening pain, unable to urinate, nausea/vomiting, etc go to ER -     CBC with Differential/Platelet -     BASIC METABOLIC PANEL WITH GFR -     Urinalysis w microscopic + reflex cultur -     Urine  Culture -     ciprofloxacin (CIPRO) 500 MG tablet; Take 1 tablet (500 mg total) by mouth 2 (two) times daily. -     Ambulatory referral to Urology

## 2016-09-17 LAB — BASIC METABOLIC PANEL WITH GFR
BUN: 19 mg/dL (ref 7–25)
CO2: 29 mmol/L (ref 20–32)
Calcium: 10.7 mg/dL — ABNORMAL HIGH (ref 8.6–10.4)
Chloride: 101 mmol/L (ref 98–110)
Creat: 0.81 mg/dL (ref 0.60–0.93)
GFR, EST NON AFRICAN AMERICAN: 73 mL/min/{1.73_m2} (ref >=60)
GFR, Est African American: 85 mL/min/{1.73_m2} (ref >=60)
GLUCOSE: 108 mg/dL — AB (ref 65–99)
POTASSIUM: 5.4 mmol/L — AB (ref 3.5–5.3)
Sodium: 145 mmol/L (ref 135–146)

## 2016-09-17 LAB — CBC WITH DIFFERENTIAL/PLATELET
Basophils Absolute: 56 cells/uL (ref 0–200)
Basophils Relative: 0.6 %
EOS PCT: 1.7 %
Eosinophils Absolute: 158 cells/uL (ref 15–500)
HCT: 43.1 % (ref 35.0–45.0)
Hemoglobin: 14.4 g/dL (ref 11.7–15.5)
Lymphs Abs: 1479 cells/uL (ref 850–3900)
MCH: 30 pg (ref 27.0–33.0)
MCHC: 33.4 g/dL (ref 32.0–36.0)
MCV: 89.8 fL (ref 80.0–100.0)
MONOS PCT: 5.6 %
MPV: 11.5 fL (ref 7.5–12.5)
NEUTROS PCT: 76.2 %
Neutro Abs: 7087 cells/uL (ref 1500–7800)
PLATELETS: 349 10*3/uL (ref 140–400)
RBC: 4.8 10*6/uL (ref 3.80–5.10)
RDW: 12.3 % (ref 11.0–15.0)
TOTAL LYMPHOCYTE: 15.9 %
WBC mixed population: 521 cells/uL (ref 200–950)
WBC: 9.3 10*3/uL (ref 3.8–10.8)

## 2016-09-17 LAB — URINE CULTURE
MICRO NUMBER: 80999881
SPECIMEN QUALITY:: ADEQUATE

## 2016-09-17 LAB — URINALYSIS W MICROSCOPIC + REFLEX CULTURE
BACTERIA UA: NONE SEEN /HPF
BILIRUBIN URINE: NEGATIVE
GLUCOSE, UA: NEGATIVE
Hyaline Cast: NONE SEEN /LPF
KETONES UR: NEGATIVE
NITRITES URINE, INITIAL: NEGATIVE
PH: 7 (ref 5.0–8.0)
Protein, ur: NEGATIVE
RBC / HPF: >=60 /HPF — AB (ref 0–2)
SPECIFIC GRAVITY, URINE: 1.007 (ref 1.001–1.03)
Squamous Epithelial / LPF: NONE SEEN /HPF (ref <=5)

## 2016-09-17 LAB — CULTURE INDICATED

## 2016-09-19 ENCOUNTER — Ambulatory Visit: Payer: PPO

## 2016-09-26 ENCOUNTER — Ambulatory Visit
Admission: RE | Admit: 2016-09-26 | Discharge: 2016-09-26 | Disposition: A | Discharge status: Home / Self Care | Payer: PPO | Source: Ambulatory Visit | Attending: Internal Medicine | Admitting: Internal Medicine

## 2016-09-26 DIAGNOSIS — Z1231 Encounter for screening mammogram for malignant neoplasm of breast: Secondary | ICD-10-CM

## 2016-09-29 NOTE — Progress Notes (Signed)
Follow up Assessment and Plan:    HYPERTENSION - CBC with Differential - BASIC METABOLIC PANEL WITH GFR - Hepatic function panel - TSH   Hyperlipidemia - Lipid panel   Depression, remission depression, in remission  Encounter for long-term (current) use of other medications - Magnesium  Hypothyroidism -check TSH level, continue medications the same, reminded to take on an empty stomach 30-41mins before food.   Distal bicep tendonitis Given information  Future Appointments Date Time Provider [place]  01/05/2017 3:30 PM Unk Pinto, MD [facility] None  07/08/2017 3:00 PM Unk Pinto, MD [facility] None    Subjective:   [name] is a 71 y.o. female who presents for 3 month follow up on hypertension, prediabetes, hyperlipidemia, vitamin D def.   Her blood pressure has been controlled at home, she is on one a day, today their BP is BP: 118/70, she is on bASA every other day.  Has some right arm pain, at distal arm, pain worse with weights that she has been doing. No numbness, tingling, no shoulder pain.  She does workout, going to senior center. She denies chest pain, shortness of breath, dizziness.  She is on cholesterol medication, she is on lipitor she is on 1/4 T,T,S and she is having muscle aches at night . Her cholesterol is not at goal. The cholesterol last visit was:   Lab Results  Component Value Date   CHOL 192 06/11/2016   HDL 42 (L) 06/11/2016   LDLCALC 114 (H) 06/11/2016   TRIG 180 (H) 06/11/2016   CHOLHDL 4.6 06/11/2016    Last A1C in the office was:  Lab Results  Component Value Date   HGBA1C 5.4 06/11/2016   Patient is on Vitamin D supplement. She had hematuria, treated with cipro, had some muscle pain with this, doing better, no more urinary symptoms.  She is on thyroid medication. Her medication was not changed last visit. Patient denies heat / cold intolerance, nervousness and palpitations.  Lab Results  Component Value  Date   TSH 1.55 06/11/2016  .  Body mass index is 27.13 kg/m. Wt Readings from Last 3 Encounters:  09/30/16 143 lb 9.6 oz (65.1 kg)  09/16/16 144 lb (65.3 kg)  06/11/16 147 lb 3.2 oz (66.8 kg)    Medication Review Current Outpatient Prescriptions on File Prior to Visit  Medication Sig Dispense Refill  . ALPRAZolam (XANAX) 1 MG tablet TAKE 1/2-1 TABLET BY MOUTH 3 TIMES A DAY AS NEEDED FOR ANXIETY 90 tablet 1  . aspirin 81 MG tablet Take 81 mg by mouth daily.    [name] atorvastatin (LIPITOR) 80 MG tablet TAKE 1/2 TO 1 TABLET DAILY OR AS DIRECTED FOR CHOLESTEROL 90 tablet 0  . Cholecalciferol (VITAMIN D PO) Take 2,000 Int'l Units by mouth 2 (two) times daily.     . furosemide (LASIX) 40 MG tablet TAKE 1 TABLET 1 OR 2 X DAILY FOR BP & FLUID 180 tablet 1  . GINKGO BILOBA PLUS PO Take 60 mg by mouth daily.    [name] levothyroxine (SYNTHROID, LEVOTHROID) 75 MCG tablet TAKE 1 TABLET EVERY DAY 90 tablet 1  . OVER THE COUNTER MEDICATION Drinks Petra Kuba Made protein drink after exercise.    . ranitidine (ZANTAC) 300 MG tablet TAKE 1 TABLET IN THE EVENING 90 tablet 1  . vitamin C (ASCORBIC ACID) 500 MG tablet Take 500 mg by mouth daily.     No current facility-administered medications on file prior to visit.     Current Problems (  verified) Patient Active Problem List   Diagnosis Date Noted  . Benign paroxysmal positional vertigo 02/07/2016  . Medicare annual wellness visit, subsequent 09/21/2014  . Medication management 08/15/2013  . Other abnormal glucose 01/26/2013  . Vitamin D deficiency 12/13/2012  . GERD   . Hyperlipidemia   . Depression, major, in remission ([place])   . Essential hypertension 09/09/2008    Allergies Allergies  Allergen Reactions  . Aspirin     REACTION: GI ulcers  . Fish Oil     Allergic to fish oil and shell fish  . Morphine And Related   . Sulfa Antibiotics   . Vicodin [Hydrocodone-Acetaminophen] Nausea Only    Surgical History: reviewed and unchanged Family  History: reviewed and unchanged Social History: reviewed and unchanged   Objective:   Blood pressure 118/70, pulse 74, temperature (!) 97.5 F (36.4 C), resp. rate 16, height 5\' 1"  (1.549 m), weight 143 lb 9.6 oz (65.1 kg), SpO2 94 %. Body mass index is 27.13 kg/m.  General appearance: alert, no distress, WD/WN,  female HEENT: normocephalic, sclerae anicteric, TMs pearly, nares patent, no discharge or erythema, pharynx normal Oral cavity: MMM, no lesions Neck: supple, no lymphadenopathy, no thyromegaly, no masses Heart: RRR, normal S1, S2, no murmurs Lungs: CTA bilaterally, no wheezes, rhonchi, or rales Abdomen: +bs, soft, non tender, non distended, no masses, no hepatomegaly, no splenomegaly Musculoskeletal: nontender, no swelling, no obvious deformity Extremities: no edema, no cyanosis, no clubbing Pulses: 2+ symmetric, upper and lower extremities, normal cap refill Neurological: alert, oriented x 3, CN2-12 intact, strength normal upper extremities and lower extremities, sensation normal throughout, DTRs 2+ throughout, no cerebellar signs, gait normal Psychiatric: normal affect, behavior normal, pleasant      Vicie Mutters, PA-C   09/30/2016

## 2016-09-30 ENCOUNTER — Ambulatory Visit: Payer: PPO | Admitting: Physician Assistant

## 2016-09-30 ENCOUNTER — Encounter: Payer: Self-pay | Admitting: Physician Assistant

## 2016-09-30 VITALS — BP 118/70 | HR 74 | Temp 97.5°F | Resp 16 | Ht 61.0 in | Wt 143.6 lb

## 2016-09-30 DIAGNOSIS — E559 Vitamin D deficiency, unspecified: Secondary | ICD-10-CM | POA: Diagnosis not present

## 2016-09-30 DIAGNOSIS — I10 Essential (primary) hypertension: Secondary | ICD-10-CM

## 2016-09-30 DIAGNOSIS — E782 Mixed hyperlipidemia: Secondary | ICD-10-CM | POA: Diagnosis not present

## 2016-09-30 DIAGNOSIS — Z79899 Other long term (current) drug therapy: Secondary | ICD-10-CM

## 2016-09-30 DIAGNOSIS — M7521 Bicipital tendinitis, right shoulder: Secondary | ICD-10-CM | POA: Diagnosis not present

## 2016-09-30 DIAGNOSIS — E039 Hypothyroidism, unspecified: Secondary | ICD-10-CM

## 2016-09-30 DIAGNOSIS — D649 Anemia, unspecified: Secondary | ICD-10-CM

## 2016-09-30 NOTE — Patient Instructions (Addendum)
Can look up LPR or silent reflux  Biceps Tendon Tendinitis (Distal) Distal biceps tendon tendinitis is inflammation of the distal biceps tendon. The distal biceps tendon is a strong cord of tissue that connects the biceps muscle, on the front of the upper arm, to a bone (radius) in the elbow. Distal biceps tendon tendinitis can interfere with the ability to bend the elbow and turn the hand palm-up (supination). This condition is usually caused by overusing the elbow joint and the biceps muscle, and it usually heals within 6 weeks. Distal biceps tendon tendinitis may include a grade 1 or grade 2 strain of the tendon. A grade 1 strain is mild, and it involves a slight pull of the tendon without any stretching or noticeable tearing of the tendon. There is usually no loss of biceps muscle strength. A grade 2 strain is moderate, and it involves a small tear in the tendon. The tendon is stretched, and biceps muscle strength is usually decreased. What are the causes? This condition may be caused by:  A sudden increase in the frequency or intensity of activity that involves the elbow and the biceps muscle.  Overuse of the biceps muscle. This can happen when you do the same movements over and over, such as: ? Supination. ? Forceful straightening (hyperextension) of the elbow. ? Bending of the elbow.  A direct, forceful hit or injury (trauma) to the elbow. This is rare.  What increases the risk? The following factors may make you more likely to develop this condition:  Playing contact sports.  Playing sports that involve throwing and overhead movements, including racket sports, gymnastics, weight lifting, or bodybuilding.  Doing physical labor.  Having poor strength and flexibility of the arm and shoulder.  Having injured other parts of the elbow.  What are the signs or symptoms? Symptoms of this condition may include:  Pain and inflammation in the front of the elbow. Pain may get worse during  certain movements, such as: ? Supination. ? Bending the elbow. ? Lifting or carrying objects. ? Throwing.  A feeling of warmth in the front of the elbow.  A crackling sound (crepitation) when you move or touch the elbow or the upper arm.  In some cases, symptoms may return (recur) after treatment, and they may be long-lasting (chronic). How is this diagnosed? This condition is diagnosed based on your symptoms, your medical history, and a physical exam. You may have tests, including X-rays or MRIs. Your health care provider may test your range of motion by having you do arm movements. How is this treated? This condition is treated by resting and icing the injured area, and by doing physical therapy exercises. Depending on the severity of your condition, treatment may also include:  Medicines to help relieve pain and inflammation.  Ultrasound therapy. This is the application of sound waves to the injured area.  Follow these instructions at home: Managing pain, stiffness, and swelling  If directed, put ice on the injured area: ? Put ice in a plastic bag. ? Place a towel between your skin and the bag. ? Leave the ice on for 20 minutes, 2-3 times a day.  Move your fingers often to avoid stiffness and to lessen swelling.  Raise (elevate) the injured area above the level of your heart while you are sitting or lying down.  If directed, apply heat to the affected area before you exercise. Use the heat source that your health care provider recommends, such as a moist heat pack or  a heating pad. ? Place a towel between your skin and the heat source. ? Leave the heat on for 20-30 minutes. ? Remove the heat if your skin turns bright red. This is especially important if you are unable to feel pain, heat, or cold. You may have a greater risk of getting burned. Activity  Return to your normal activities as told by your health care provider. Ask your health care provider what activities are safe  for you.  Do not lift anything that is heavier than 10 lb (4.5 kg) until your health care provider tells you that it is safe.  Avoid activities that cause pain or make your condition worse.  Do exercises as told by your health care provider. General instructions  Take over-the-counter and prescription medicines only as told by your health care provider.  Do not drive or operate heavy machinery while taking prescription pain medicines.  Keep all follow-up visits as told by your health care provider. This is important. How is this prevented?  Warm up and stretch before being active.  Cool down and stretch after being active.  Give your body time to rest between periods of activity.  Make sure to use equipment that fits you.  Be safe and responsible while being active to avoid falls.  Do at least 150 minutes of moderate-intensity exercise each week, such as brisk walking or water aerobics.  Maintain physical fitness, including: ? Strength. ? Flexibility. ? Cardiovascular fitness. ? Endurance. Contact a health care provider if:  You have symptoms that get worse or do not get better after 2 weeks of treatment.  You develop new symptoms. Get help right away if:  You develop severe pain. This information is not intended to replace advice given to you by your health care provider. Make sure you discuss any questions you have with your health care provider. Document Released: 12/23/2004 Document Revised: 08/30/2015 Document Reviewed: 12/01/2014 Elsevier Interactive Patient Education  2018 [employer].   Biceps Tendon Tendinitis (Distal) Rehab Ask your health care provider which exercises are safe for you. Do exercises exactly as told by your health care provider and adjust them as directed. It is normal to feel mild stretching, pulling, tightness, or discomfort as you do these exercises, but you should stop right away if you feel sudden pain or your pain gets worse.Do not  begin these exercises until told by your health care provider. Stretching and range of motion exercises These exercises warm up your muscles and joints and improve the movement and flexibility of your arm. These exercises can also help to relieve pain and stiffness. Exercise A: Supination, active-assisted 1. Stand or sit with your left / right elbow bent to an "L" shape (90 degrees). 2. Rotate your palm up until you cannot rotate it anymore. Then, use your other hand to help rotate your left / right forearm more. 3. Hold this position for __________ seconds. 4. Slowly return to the starting position. Repeat __________ times. Complete this exercise __________ times a day. Exercise B: Pronation, active-assisted  1. Stand or sit with your left / right elbow bent to an "L" shape (90 degrees). 2. Rotate your left / right palm down until you cannot rotate it anymore. Then, use your other hand to help rotate your left / right forearm more. 3. Hold this position for __________ seconds. 4. Slowly return to the starting position. Repeat __________ times. Complete this exercise __________ times a day. Strengthening exercises These exercises build strength and endurance in  your arm and shoulder. Endurance is the ability to use your muscles for a long time, even after your muscles get tired. Exercise C: Supination  1. Sit with your left / right forearm supported on a table. Your elbow should be at waist height. 2. Rest your hand over the edge of the table, palm-down. 3. Gently grasp a lightweight hammer near the head. As this exercise gets easier for you, try holding the hammer farther down the handle. 4. Without moving your elbow, slowly rotate your palm up so it faces the ceiling. 5. Hold this position for__________ seconds. 6. Slowly return to the starting position. Repeat __________ times. Complete this exercise __________ times a day. Exercise D: Pronation  1. Sit with your left / right forearm  supported on a table. Your elbow should be at waist height. 2. Rest your hand over the edge of the table, palm-up. 3. Gently grasp a lightweight hammer near the head. As this exercise gets easier for you, try holding the hammer farther down the handle. 4. Without moving your elbow, slowly rotate your palm down. 5. Hold this position for __________ seconds. 6. Slowly return to the starting position. Repeat __________ times. Complete this exercise __________ times a day. Exercise E: Elbow flexion, supinated  1. Sit on a stable chair without armrests, or stand. 2. If directed, hold a __________ weight in your left / right hand, or hold an exercise band with both hands. Your palms should face up toward the ceiling at the starting position. 3. Bend your left / right elbow and move your hand up toward your shoulder. Keep your other arm straight down, in the starting position. 4. Slowly return to the starting position. Repeat __________ times. Complete this exercise __________ times a day. Exercise F: Elbow extension  1. Lie on your back. 2. Hold a __________ weight in your left / right hand. 3. Bend your left / right elbow to an "L" shape (90 degrees) so your elbow is pointed up to the ceiling and the weight is overhead. 4. Straighten your elbow, raising your hand toward the ceiling. Use your other hand to support your left / right upper arm and to keep it still. 5. Slowly return to the starting position. Repeat __________ times. Complete this exercise __________ times a day. This information is not intended to replace advice given to you by your health care provider. Make sure you discuss any questions you have with your health care provider. Document Released: 12/23/2004 Document Revised: 08/30/2015 Document Reviewed: 12/01/2014 Elsevier Interactive Patient Education  2018 [employer].   Seborrheic Keratosis Seborrheic keratosis is a common, noncancerous (benign) skin growth. This condition  causes waxy, rough, tan, brown, or black spots to appear on the skin. These skin growths can be flat or raised. What are the causes? The cause of this condition is not known. What increases the risk? This condition is more likely to develop in:  People who have a family history of seborrheic keratosis.  People who are 73 or older.  People who are pregnant.  People who have had estrogen replacement therapy.  What are the signs or symptoms? This condition often occurs on the face, chest, shoulders, back, or other areas. These growths:  Are usually painless, but may become irritated and itchy.  Can be yellow, brown, black, or other colors.  Are slightly raised or have a flat surface.  Are sometimes rough or wart-like in texture.  Are often waxy on the surface.  Are round or  oval-shaped.  Sometimes look like they are "stuck on."  Often occur in groups, but may occur as a single growth.  How is this diagnosed? This condition is diagnosed with a medical history and physical exam. A sample of the growth may be tested (skin biopsy). You may need to see a skin specialist (dermatologist). How is this treated? Treatment is not usually needed for this condition, unless the growths are irritated or are often bleeding. You may also choose to have the growths removed if you do not like their appearance. Most commonly, these growths are treated with a procedure in which liquid nitrogen is applied to "freeze" off the growth (cryosurgery). They may also be burned off with electricity or cut off. Follow these instructions at home:  Watch your growth for any changes.  Keep all follow-up visits as told by your health care provider. This is important.  Do not scratch or pick at the growth or growths. This can cause them to become irritated or infected. Contact a health care provider if:  You suddenly have many new growths.  Your growth bleeds, itches, or hurts.  Your growth suddenly  becomes larger or changes color. This information is not intended to replace advice given to you by your health care provider. Make sure you discuss any questions you have with your health care provider. Document Released: 01/25/2010 Document Revised: 05/31/2015 Document Reviewed: 05/10/2014 Elsevier Interactive Patient Education  2017 [employer].   What is the TMJ? The temporomandibular (tem-[id number]-ro-man-DIB-[id number]-ler) joint, or the TMJ, connects the upper and lower jawbones. This joint allows the jaw to open wide and move back and forth when you chew, talk, or yawn.There are also several muscles that help this joint move. There can be muscle tightness and pain in the muscle that can cause several symptoms.  What causes TMJ pain? There are many causes of TMJ pain. Repeated chewing (for example, chewing gum) and clenching your teeth can cause pain in the joint. Some TMJ pain has no obvious cause. What can I do to ease the pain? There are many things you can do to help your pain get better. When you have pain:  Eat soft foods and stay away from chewy foods (for example, taffy) Try to use both sides of your mouth to chew Don't chew gum Massage Don't open your mouth wide (for example, during yawning or singing) Don't bite your cheeks or fingernails Lower your amount of stress and worry Applying a warm, damp washcloth to the joint may help. Over-the-counter pain medicines such as ibuprofen (one brand: Advil) or acetaminophen (one brand: Tylenol) might also help. Do not use these medicines if you are allergic to them or if your doctor told you not to use them. How can I stop the pain from coming back? When your pain is better, you can do these exercises to make your muscles stronger and to keep the pain from coming back:  Resisted mouth opening: Place your thumb or two fingers under your chin and open your mouth slowly, pushing up lightly on your chin with your thumb. Hold for three to six seconds.  Close your mouth slowly. Resisted mouth closing: Place your thumbs under your chin and your two index fingers on the ridge between your mouth and the bottom of your chin. Push down lightly on your chin as you close your mouth. Tongue up: Slowly open and close your mouth while keeping the tongue touching the roof of the mouth. Side-to-side jaw movement: Place an object  about one fourth of an inch thick (for example, two tongue depressors) between your front teeth. Slowly move your jaw from side to side. Increase the thickness of the object as the exercise becomes easier Forward jaw movement: Place an object about one fourth of an inch thick between your front teeth and move the bottom jaw forward so that the bottom teeth are in front of the top teeth. Increase the thickness of the object as the exercise becomes easier. These exercises should not be painful. If it hurts to do these exercises, stop doing them and talk to your family doctor.

## 2016-10-01 LAB — IRON, TOTAL/TOTAL IRON BINDING CAP
%SAT: 18 % (calc) (ref 11–50)
IRON: 66 ug/dL (ref 45–160)
TIBC: 358 ug/dL (ref 250–450)

## 2016-10-01 LAB — FERRITIN: FERRITIN: 50 ng/mL (ref 20–288)

## 2016-10-31 DIAGNOSIS — R351 Nocturia: Secondary | ICD-10-CM | POA: Diagnosis not present

## 2016-10-31 DIAGNOSIS — N3941 Urge incontinence: Secondary | ICD-10-CM | POA: Diagnosis not present

## 2016-10-31 DIAGNOSIS — R310 Gross hematuria: Secondary | ICD-10-CM | POA: Diagnosis not present

## 2016-10-31 DIAGNOSIS — R350 Frequency of micturition: Secondary | ICD-10-CM | POA: Diagnosis not present

## 2016-11-08 ENCOUNTER — Other Ambulatory Visit: Payer: Self-pay | Admitting: Internal Medicine

## 2016-11-09 NOTE — Telephone Encounter (Signed)
Please call Alpraz  

## 2016-11-17 ENCOUNTER — Other Ambulatory Visit: Payer: Self-pay | Admitting: Internal Medicine

## 2016-11-17 DIAGNOSIS — I10 Essential (primary) hypertension: Secondary | ICD-10-CM

## 2016-11-19 DIAGNOSIS — N3941 Urge incontinence: Secondary | ICD-10-CM | POA: Diagnosis not present

## 2016-11-19 DIAGNOSIS — R310 Gross hematuria: Secondary | ICD-10-CM | POA: Diagnosis not present

## 2016-11-19 DIAGNOSIS — R351 Nocturia: Secondary | ICD-10-CM | POA: Diagnosis not present

## 2016-11-19 DIAGNOSIS — R350 Frequency of micturition: Secondary | ICD-10-CM | POA: Diagnosis not present

## 2016-12-11 ENCOUNTER — Other Ambulatory Visit: Payer: Self-pay | Admitting: Physician Assistant

## 2016-12-19 DIAGNOSIS — N3941 Urge incontinence: Secondary | ICD-10-CM | POA: Diagnosis not present

## 2016-12-19 DIAGNOSIS — R310 Gross hematuria: Secondary | ICD-10-CM | POA: Diagnosis not present

## 2016-12-20 ENCOUNTER — Other Ambulatory Visit: Payer: Self-pay | Admitting: Internal Medicine

## 2017-01-05 ENCOUNTER — Encounter: Payer: Self-pay | Admitting: Internal Medicine

## 2017-01-05 ENCOUNTER — Ambulatory Visit: Payer: PPO | Admitting: Internal Medicine

## 2017-01-05 VITALS — BP 110/62 | HR 68 | Temp 97.3°F | Resp 18 | Ht 61.0 in | Wt 150.0 lb

## 2017-01-05 DIAGNOSIS — Z79899 Other long term (current) drug therapy: Secondary | ICD-10-CM | POA: Diagnosis not present

## 2017-01-05 DIAGNOSIS — R7309 Other abnormal glucose: Secondary | ICD-10-CM

## 2017-01-05 DIAGNOSIS — R7303 Prediabetes: Secondary | ICD-10-CM

## 2017-01-05 DIAGNOSIS — E782 Mixed hyperlipidemia: Secondary | ICD-10-CM | POA: Diagnosis not present

## 2017-01-05 DIAGNOSIS — I10 Essential (primary) hypertension: Secondary | ICD-10-CM | POA: Diagnosis not present

## 2017-01-05 DIAGNOSIS — E039 Hypothyroidism, unspecified: Secondary | ICD-10-CM

## 2017-01-05 DIAGNOSIS — E559 Vitamin D deficiency, unspecified: Secondary | ICD-10-CM

## 2017-01-05 MED ORDER — CYCLOBENZAPRINE HCL 10 MG PO TABS: ORAL_TABLET | ORAL | 3 refills | Status: DC

## 2017-01-05 NOTE — Patient Instructions (Signed)

## 2017-01-05 NOTE — Progress Notes (Signed)
This very nice 71 y.o. WWF presents for 6 month follow up with Hypertension, Hyperlipidemia, Pre-Diabetes and Vitamin D Deficiency.      Patient is treated for HTN (1995) & BP has been controlled at home. Today's BP is at goal - 110/62. Patient has had no complaints of any cardiac type chest pain, palpitations, dyspnea / orthopnea / PND, dizziness, claudication, or dependent edema.     Hyperlipidemia is not controlled with diet & meds. Patient denies myalgias or other med SE's. Last Lipids were not at goal:  Lab Results  Component Value Date   CHOL 192 06/11/2016   HDL 42 (L) 06/11/2016   LDLCALC 114 (H) 06/11/2016   TRIG 180 (H) 06/11/2016   CHOLHDL 4.6 06/11/2016      Also, the patient has history of PreDiabetes  (A1c 5.7%/2011) and has had no symptoms of reactive hypoglycemia, diabetic polys, paresthesias or visual blurring.  Last A1c was normal at goal: Lab Results  Component Value Date   HGBA1C 5.4 06/11/2016      Patient was dx'd Hypothyroid in 2009 & has been on replacement since. Further, the patient also has history of Vitamin D Deficiency ("40"/2008) and supplements vitamin D without any suspected side-effects. Last vitamin D was at goal:  Lab Results  Component Value Date   VD25OH 88 06/11/2016   Current Outpatient Medications on File Prior to Visit  Medication Sig  . ALPRAZolam (XANAX) 1 MG tablet Take 1/2 to 1 tablet 2 to 3 x / day and please try to limit to 5 days /week to avoid addiction  . aspirin 81 MG tablet Take 81 mg by mouth daily.  [name] atorvastatin (LIPITOR) 80 MG tablet TAKE 1/2 TO 1 TABLET DAILY OR AS DIRECTED FOR CHOLESTEROL  . Cholecalciferol (VITAMIN D PO) Take 2,000 Int'l Units by mouth 2 (two) times daily.   . furosemide (LASIX) 40 MG tablet TAKE 1 TABLET 1 OR 2 X DAILY FOR BP & FLUID  . GINKGO BILOBA PLUS PO Take 60 mg by mouth daily.  [name] levothyroxine (SYNTHROID, LEVOTHROID) 75 MCG tablet TAKE 1 TABLET BY MOUTH EVERY DAY  . OVER THE COUNTER  MEDICATION Drinks Petra Kuba Made protein drink after exercise.  . ranitidine (ZANTAC) 300 MG tablet TAKE 1 TABLET IN THE EVENING  . vitamin C (ASCORBIC ACID) 500 MG tablet Take 500 mg by mouth daily.   No current facility-administered medications on file prior to visit.    Allergies  Allergen Reactions  . Aspirin     REACTION: GI ulcers  . Fish Oil     Allergic to fish oil and shell fish  . Morphine And Related   . Sulfa Antibiotics   . Vicodin [Hydrocodone-Acetaminophen] Nausea Only   PMHx:   Past Medical History:  Diagnosis Date  . Vitamin D deficiency    Immunization History  Administered Date(s) Administered  . Influenza, High Dose Seasonal PF 10/18/2014, 11/06/2015  . Influenza-Unspecified 10/13/2016  . Pneumococcal Conjugate-13 06/21/2014  . Pneumococcal Polysaccharide-23 01/21/2012  . Td 01/21/2012   Past Surgical History:  Procedure Laterality Date  . ABDOMINAL HYSTERECTOMY  1981   partial  . APPENDECTOMY    . BACK SURGERY    . CESAREAN SECTION    . SALPINGOOPHORECTOMY  1981 left   1982 right   FHx:    Reviewed / unchanged  SHx:    Reviewed / unchanged  Systems Review:  Constitutional: Denies fever, chills, wt changes, headaches, insomnia, fatigue, night sweats, change  in appetite. Eyes: Denies redness, blurred vision, diplopia, discharge, itchy, watery eyes.  ENT: Denies discharge, congestion, post nasal drip, epistaxis, sore throat, earache, hearing loss, dental pain, tinnitus, vertigo, sinus pain, snoring.  CV: Denies chest pain, palpitations, irregular heartbeat, syncope, dyspnea, diaphoresis, orthopnea, PND, claudication or edema. Respiratory: denies cough, dyspnea, DOE, pleurisy, hoarseness, laryngitis, wheezing.  Gastrointestinal: Denies dysphagia, odynophagia, heartburn, reflux, water brash, abdominal pain or cramps, nausea, vomiting, bloating, diarrhea, constipation, hematemesis, melena, hematochezia  or hemorrhoids. Genitourinary: Denies dysuria,  frequency, urgency, nocturia, hesitancy, discharge, hematuria or flank pain. Musculoskeletal: Denies arthralgias, myalgias, stiffness, jt. swelling, pain, limping or strain/sprain.  Skin: Denies pruritus, rash, hives, warts, acne, eczema or change in skin lesion(s). Neuro: No weakness, tremor, incoordination, spasms, paresthesia or pain. Psychiatric: Denies confusion, memory loss or sensory loss. Endo: Denies change in weight, skin or hair change.  Heme/Lymph: No excessive bleeding, bruising or enlarged lymph nodes.  Physical Exam  BP 110/62   Pulse 68   Temp (!) 97.3 F (36.3 C)   Resp 18   Ht 5\' 1"  (1.549 m)   Wt 150 lb (68 kg)   BMI 28.34 kg/m   Appears well nourished, well groomed  and in no distress.  Eyes: PERRLA, EOMs, conjunctiva no swelling or erythema. Sinuses: No frontal/maxillary tenderness ENT/Mouth: EAC's clear, TM's nl w/o erythema, bulging. Nares clear w/o erythema, swelling, exudates. Oropharynx clear without erythema or exudates. Oral hygiene is good. Tongue normal, non obstructing. Hearing intact.  Neck: Supple. Thyroid nl. Car 2+/2+ without bruits, nodes or JVD. Chest: Respirations nl with BS clear & equal w/o rales, rhonchi, wheezing or stridor.  Cor: Heart sounds normal w/ regular rate and rhythm without sig. murmurs, gallops, clicks or rubs. Peripheral pulses normal and equal  without edema.  Abdomen: Soft & bowel sounds normal. Non-tender w/o guarding, rebound, hernias, masses or organomegaly.  Lymphatics: Unremarkable.  Musculoskeletal: Full ROM all peripheral extremities, joint stability, 5/5 strength and normal gait.  Skin: Warm, dry without exposed rashes, lesions or ecchymosis apparent.  Neuro: Cranial nerves intact, reflexes equal bilaterally. Sensory-motor testing grossly intact. Tendon reflexes grossly intact.  Pysch: Alert & oriented x 3.  Insight and judgement nl & appropriate. No ideations.  Assessment and Plan:  1. Essential hypertension  -  Continue medication, monitor blood pressure at home.  - Continue DASH diet. Reminder to go to the ER if any CP,  SOB, nausea, dizziness, severe HA, changes vision/speech.  - CBC with Differential/Platelet - BASIC METABOLIC PANEL WITH GFR - Magnesium - TSH  2. Hyperlipidemia, mixed  - Continue diet/meds, exercise,& lifestyle modifications.  - Continue monitor periodic cholesterol/liver & renal functions   - Hepatic function panel - Lipid panel - TSH  3. Prediabetes  - Continue diet, exercise, lifestyle modifications.  - Monitor appropriate labs.  - Hemoglobin A1c - Insulin, random  4. Vitamin D deficiency  - Continue supplementation.  - VITAMIN D 25 Hydroxy  5. Abnormal glucose  - Hemoglobin A1c - Insulin, random  6. Hypothyroidism  - TSH  7. Medication management  - CBC with Differential/Platelet - BASIC METABOLIC PANEL WITH GFR - Hepatic function panel - Magnesium - Lipid panel - TSH - Hemoglobin A1c - Insulin, random - VITAMIN D 25 Hydroxy         Discussed  regular exercise, BP monitoring, weight control to achieve/maintain BMI less than 25 and discussed med and SE's. Recommended labs to assess and monitor clinical status with further disposition pending results of labs. Over 30 minutes  of exam, counseling, chart review was performed.

## 2017-01-06 ENCOUNTER — Other Ambulatory Visit: Payer: Self-pay | Admitting: Internal Medicine

## 2017-01-06 MED ORDER — EZETIMIBE 10 MG PO TABS: 10.0000 mg | ORAL_TABLET | Freq: Every day | ORAL | 1 refills | Status: DC

## 2017-01-07 LAB — BASIC METABOLIC PANEL WITH GFR
BUN: 19 mg/dL (ref 7–25)
CALCIUM: 9.6 mg/dL (ref 8.6–10.4)
CO2: 32 mmol/L (ref 20–32)
CREATININE: 0.74 mg/dL (ref 0.60–0.93)
Chloride: 103 mmol/L (ref 98–110)
GFR, EST NON AFRICAN AMERICAN: 81 mL/min/{1.73_m2} (ref >=60)
GFR, Est African American: 94 mL/min/{1.73_m2} (ref >=60)
Glucose, Bld: 102 mg/dL — ABNORMAL HIGH (ref 65–99)
POTASSIUM: 4.2 mmol/L (ref 3.5–5.3)
Sodium: 144 mmol/L (ref 135–146)

## 2017-01-07 LAB — CBC WITH DIFFERENTIAL/PLATELET
BASOS PCT: 1 %
Basophils Absolute: 61 cells/uL (ref 0–200)
EOS ABS: 128 {cells}/uL (ref 15–500)
Eosinophils Relative: 2.1 %
HCT: 38.2 % (ref 35.0–45.0)
Hemoglobin: 13.1 g/dL (ref 11.7–15.5)
Lymphs Abs: 1659 cells/uL (ref 850–3900)
MCH: 30 pg (ref 27.0–33.0)
MCHC: 34.3 g/dL (ref 32.0–36.0)
MCV: 87.6 fL (ref 80.0–100.0)
MONOS PCT: 6.3 %
MPV: 10.7 fL (ref 7.5–12.5)
Neutro Abs: 3867 cells/uL (ref 1500–7800)
Neutrophils Relative %: 63.4 %
PLATELETS: 300 10*3/uL (ref 140–400)
RBC: 4.36 10*6/uL (ref 3.80–5.10)
RDW: 12.8 % (ref 11.0–15.0)
TOTAL LYMPHOCYTE: 27.2 %
WBC mixed population: 384 cells/uL (ref 200–950)
WBC: 6.1 10*3/uL (ref 3.8–10.8)

## 2017-01-07 LAB — HEPATIC FUNCTION PANEL
AG Ratio: 1.8 (calc) (ref 1.0–2.5)
ALBUMIN MSPROF: 4.5 g/dL (ref 3.6–5.1)
ALT: 15 U/L (ref 6–29)
AST: 23 U/L (ref 10–35)
Alkaline phosphatase (APISO): 67 U/L (ref 33–130)
BILIRUBIN TOTAL: 0.4 mg/dL (ref 0.2–1.2)
Bilirubin, Direct: 0.1 mg/dL (ref 0.0–0.2)
Globulin: 2.5 g/dL (calc) (ref 1.9–3.7)
Indirect Bilirubin: 0.3 mg/dL (calc) (ref 0.2–1.2)
Total Protein: 7 g/dL (ref 6.1–8.1)

## 2017-01-07 LAB — LIPID PANEL
CHOLESTEROL: 231 mg/dL — AB (ref <200)
HDL: 49 mg/dL — AB (ref >50)
LDL Cholesterol (Calc): 158 mg/dL (calc) — ABNORMAL HIGH
NON-HDL CHOLESTEROL (CALC): 182 mg/dL — AB (ref <130)
TRIGLYCERIDES: 118 mg/dL (ref <150)
Total CHOL/HDL Ratio: 4.7 (calc) (ref <5.0)

## 2017-01-07 LAB — INSULIN, RANDOM: INSULIN: 16.9 u[IU]/mL (ref 2.0–19.6)

## 2017-01-07 LAB — HEMOGLOBIN A1C
EAG (MMOL/L): 6.2 (calc)
Hgb A1c MFr Bld: 5.5 % of total Hgb (ref <5.7)
Mean Plasma Glucose: 111 (calc)

## 2017-01-07 LAB — TSH: TSH: 4.32 mIU/L (ref 0.40–4.50)

## 2017-01-07 LAB — MAGNESIUM: Magnesium: 2 mg/dL (ref 1.5–2.5)

## 2017-01-07 LAB — VITAMIN D 25 HYDROXY (VIT D DEFICIENCY, FRACTURES): VIT D 25 HYDROXY: 70 ng/mL (ref 30–100)

## 2017-01-30 ENCOUNTER — Other Ambulatory Visit: Payer: Self-pay | Admitting: Internal Medicine

## 2017-03-06 ENCOUNTER — Other Ambulatory Visit: Payer: Self-pay | Admitting: Internal Medicine

## 2017-03-06 ENCOUNTER — Telehealth: Payer: Self-pay

## 2017-03-06 MED ORDER — PROMETHAZINE-DM 6.25-15 MG/5ML PO SYRP: ORAL_SOLUTION | ORAL | 1 refills | Status: DC

## 2017-03-06 MED ORDER — CEPHALEXIN 500 MG PO CAPS: 500.0000 mg | ORAL_CAPSULE | Freq: Four times a day (QID) | ORAL | 0 refills | Status: AC

## 2017-03-06 MED ORDER — PREDNISONE 20 MG PO TABS: ORAL_TABLET | ORAL | 0 refills | Status: DC

## 2017-03-06 NOTE — Telephone Encounter (Signed)
Patient has been sick for 3 days. Symptoms include sore throat, sinus drainage, headache, sneezing, SOB, cough without mucus. Requesting an antibiotic.

## 2017-03-07 MED ORDER — ALBUTEROL SULFATE HFA 108 (90 BASE) MCG/ACT IN AERS: 2.0000 | INHALATION_SPRAY | Freq: Four times a day (QID) | RESPIRATORY_TRACT | 1 refills | Status: DC | PRN

## 2017-03-07 MED ORDER — BENZONATATE 100 MG PO CAPS: 100.0000 mg | ORAL_CAPSULE | Freq: Three times a day (TID) | ORAL | 0 refills | Status: DC | PRN

## 2017-03-07 MED ORDER — PREDNISONE 20 MG PO TABS: ORAL_TABLET | ORAL | 0 refills | Status: DC

## 2017-03-07 NOTE — Telephone Encounter (Signed)
72 y.o. female calls with 3 days of URI symptoms.   Symptoms include sore throat, sinus drainage, headache, sneezing, SOB, cough without mucus. . Treatment to date: unknown.  Problem list has Essential hypertension; GERD; Hyperlipidemia, mixed; Depression, major, in remission ([place]); Vitamin D deficiency; Abnormal glucose; Medication management; Medicare annual wellness visit, subsequent; Benign paroxysmal positional vertigo; and Prediabetes on their problem list.  Medications Current Outpatient Medications on File Prior to Visit  Medication Sig  . ALPRAZolam (XANAX) 1 MG tablet TAKE 1/2 TO 1 TABLET 2 TO 3 X / DAY AND PLEASE TRY TO LIMIT TO 5 DAYS /WEEK TO AVOID ADDICTION  . aspirin 81 MG tablet Take 81 mg by mouth daily.  [name] atorvastatin (LIPITOR) 80 MG tablet TAKE 1/2 TO 1 TABLET DAILY OR AS DIRECTED FOR CHOLESTEROL  . Cholecalciferol (VITAMIN D PO) Take 2,000 Int'l Units by mouth 2 (two) times daily.   . cyclobenzaprine (FLEXERIL) 10 MG tablet Take 1/2 to 1 tablets 2 to 3 x / day if needed for Muscle Spasms or Headaches  . ezetimibe (ZETIA) 10 MG tablet Take 1 tablet (10 mg total) by mouth daily.  . furosemide (LASIX) 40 MG tablet TAKE 1 TABLET 1 OR 2 X DAILY FOR BP & FLUID  . GINKGO BILOBA PLUS PO Take 60 mg by mouth daily.  [name] levothyroxine (SYNTHROID, LEVOTHROID) 75 MCG tablet TAKE 1 TABLET BY MOUTH EVERY DAY  . OVER THE COUNTER MEDICATION Drinks Petra Kuba Made protein drink after exercise.  . ranitidine (ZANTAC) 300 MG tablet TAKE 1 TABLET IN THE EVENING  . vitamin C (ASCORBIC ACID) 500 MG tablet Take 500 mg by mouth daily.   No current facility-administered medications on file prior to visit.     Allergies  Allergen Reactions  . Aspirin     REACTION: GI ulcers  . Fish Oil     Allergic to fish oil and shell fish  . Morphine And Related   . Sulfa Antibiotics   . Vicodin [Hydrocodone-Acetaminophen] Nausea Only    Since it has only been 3 days, we do not want to send in an  antibiotic yet. We want to wait 7-10 days for you body to fight off an infection like a virus. We can treat the inflammation which will treat the symptoms. If you are not better after 7-10 days we will send in an antibiotic for you. If you feel you need an antibiotic before that, you need an office visit.   Get on allergy pill, nasonex. I will send in prednisone, cough pill, and albuterol that you can get for 17-20 dollars with good RX.   If you are not getting better call the office for an office visit. If you are getting worse go to the ER.

## 2017-03-09 NOTE — Telephone Encounter (Signed)
Patient called and patient was already called in antibiotics per Dr. Melford Aase

## 2017-03-16 ENCOUNTER — Other Ambulatory Visit: Payer: Self-pay | Admitting: Physician Assistant

## 2017-03-16 ENCOUNTER — Other Ambulatory Visit: Payer: Self-pay | Admitting: Internal Medicine

## 2017-04-07 ENCOUNTER — Ambulatory Visit: Payer: Self-pay | Admitting: Adult Health

## 2017-04-13 DIAGNOSIS — E039 Hypothyroidism, unspecified: Secondary | ICD-10-CM | POA: Insufficient documentation

## 2017-04-13 NOTE — Progress Notes (Signed)
MEDICARE ANNUAL WELLNESS VISIT AND FOLLOW UP  Assessment:   Diagnoses and all orders for this visit:  Encounter for Medicare annual wellness exam  Essential hypertension Continue medications Monitor blood pressure at home; call if consistently over 130/80 Continue DASH diet.   Reminder to go to the ER if any CP, SOB, nausea, dizziness, severe HA, changes vision/speech, left arm numbness and tingling and jaw pain.  GERD Well managed on current medications Discussed diet, avoiding triggers and other lifestyle changes  Benign paroxysmal positional vertigo, unspecified laterality Hasn't had symptoms in over a year; treats with dramamine as needed.   Vitamin D deficiency At goal at recent check; continue to recommend supplementation for goal of 70-100 Defer vitamin D level  Medication management CBC, BMP, LFT  Hyperlipidemia, mixed Continue medications: atorvastatin  Continue low cholesterol diet and exercise.  Check lipid panel.   Depression, major, in remission ([place]) Continue medications; xanax PRN, discussed limiting daily use - will trial a taper down with melatonin/benadryl, discussed trazodone as a possible alternative to help avoid daily use if needed.  Lifestyle discussed: diet/exerise, sleep hygiene, stress management, hydration  Abnormal glucose Recent A1Cs at goal Discussed diet/exercise, weight management  Defer A1C; check BMP  Overweight (BMI 25.0-29.9) Long discussion about weight loss, diet, and exercise Recommended diet heavy in fruits and veggies and low in animal meats, cheeses, and dairy products, appropriate calorie intake Discussed appropriate weight for height and initial goal (150 lb) Follow up at next visit  Hypothyroidism, unspecified type continue medications the same pending lab results reminded to take on an empty stomach 30-55mins before food.  check TSH level  Estrogen deficiency DEXA scan ordered; last was within normal range  Over  40 minutes of exam, counseling, chart review and critical decision making was performed Future Appointments  Date Time Provider [place]  07/08/2017  3:00 PM Unk Pinto, MD [id number] None     Plan:   During the course of the visit the patient was educated and counseled about appropriate screening and preventive services including:    Pneumococcal vaccine   Prevnar 13  Influenza vaccine  Td vaccine  Screening electrocardiogram  Bone densitometry screening  Colorectal cancer screening  Diabetes screening  Glaucoma screening  Nutrition counseling   Advanced directives: requested   Subjective:  [name] is a 72 y.o. female who presents for Medicare Annual Wellness Visit and 3 month follow up.   she has a diagnosis of anxiety and is currently on xanax PRN, reports symptoms are well controlled on current regimen. she currently takes 1.5 mg at night for sleep. Reports rarely needs for anxiety. She reports difficulty falling asleep and staying asleep; has not tried any other sleep aids.   BMI is Body mass index is 29.1 kg/m., she has been working on diet and exercise - has been avoiding red meat, eating chicken and salads. Avoids carbs overall.  Wt Readings from Last 3 Encounters:  04/14/17 154 lb (69.9 kg)  01/05/17 150 lb (68 kg)  09/30/16 143 lb 9.6 oz (65.1 kg)    Her blood pressure has been controlled at home, today their BP is BP: 110/72 She does workout. She denies chest pain, shortness of breath, dizziness.   She is on cholesterol medication (atorvastatin 40 mg every other day) and denies myalgias. Has had myalgias with higher doses of statin. Her cholesterol is not at goal. The cholesterol last visit was:   Lab Results  Component Value Date   CHOL 231 (H)  01/05/2017   HDL 49 (L) 01/05/2017   LDLCALC 158 (H) 01/05/2017   TRIG 118 01/05/2017   CHOLHDL 4.7 01/05/2017    She has been working on diet and exercise for glucose management,  and denies foot ulcerations, increased appetite, nausea, paresthesia of the feet, polydipsia, polyuria, visual disturbances, vomiting and weight loss. Last A1C in the office was:  Lab Results  Component Value Date   HGBA1C 5.5 01/05/2017   She is on thyroid medication. Her medication was not changed last visit.   Lab Results  Component Value Date   TSH 4.32 01/05/2017   Last GFR: Lab Results  Component Value Date   GFRNONAA 81 01/05/2017   Patient is on Vitamin D supplement and at goal at most recent check:    Lab Results  Component Value Date   VD25OH 70 01/05/2017      Medication Review: Current Outpatient Medications on File Prior to Visit  Medication Sig Dispense Refill  . ALPRAZolam (XANAX) 1 MG tablet TAKE 1/2 TO 1 TABLET 2 TO 3 X / DAY AND PLEASE TRY TO LIMIT TO 5 DAYS /WEEK TO AVOID ADDICTION (Patient taking differently: Take 1.5 tablets by mouth at bedtime) 60 tablet 0  . aspirin 81 MG tablet Take 81 mg by mouth daily.    [name] atorvastatin (LIPITOR) 80 MG tablet TAKE 1/2 TO 1 TABLET DAILY OR AS DIRECTED FOR CHOLESTEROL 90 tablet 0  . Cholecalciferol (VITAMIN D PO) Take 2,000 Int'l Units by mouth 2 (two) times daily.     . cyclobenzaprine (FLEXERIL) 10 MG tablet Take 1/2 to 1 tablets 2 to 3 x / day if needed for Muscle Spasms or Headaches 90 tablet 3  . ezetimibe (ZETIA) 10 MG tablet Take 1 tablet (10 mg total) by mouth daily. 90 tablet 1  . furosemide (LASIX) 40 MG tablet TAKE 1 TABLET 1 OR 2 X DAILY FOR BP & FLUID 180 tablet 1  . GINKGO BILOBA PLUS PO Take 60 mg by mouth daily.    [name] levothyroxine (SYNTHROID, LEVOTHROID) 75 MCG tablet TAKE 1 TABLET BY MOUTH EVERY DAY (Patient taking differently: TAKE 1 TABLET BY MOUTH MON-FRI AND 1/2 TABLET ON SAT & SUN) 90 tablet 1  . OVER THE COUNTER MEDICATION Drinks Petra Kuba Made protein drink after exercise.    . ranitidine (ZANTAC) 300 MG tablet TAKE 1 TABLET IN THE EVENING 90 tablet 1  . vitamin C (ASCORBIC ACID) 500 MG tablet Take 500  mg by mouth daily.    [name] albuterol (PROAIR HFA) 108 (90 Base) MCG/ACT inhaler Inhale 2 puffs into the lungs every 6 (six) hours as needed for wheezing or shortness of breath. (Patient not taking: Reported on 04/14/2017) 8 g 1  . benzonatate (TESSALON PERLES) 100 MG capsule Take 1 capsule (100 mg total) by mouth 3 (three) times daily as needed for cough (Max: 600mg  per day). 60 capsule 0  . predniSONE (DELTASONE) 20 MG tablet 2 tablets daily for 3 days, 1 tablet daily for 4 days. 10 tablet 0  . promethazine-dextromethorphan (PROMETHAZINE-DM) 6.25-15 MG/5ML syrup Take 1 to 2 tsp enery 4 hours if needed for cough 360 mL 1   No current facility-administered medications on file prior to visit.     Allergies  Allergen Reactions  . Aspirin     REACTION: GI ulcers  . Fish Oil     Allergic to fish oil and shell fish  . Morphine And Related   . Sulfa Antibiotics   . Vicodin [Hydrocodone-Acetaminophen]  Nausea Only    Current Problems (verified) Patient Active Problem List   Diagnosis Date Noted  . Hypothyroid 04/13/2017  . Benign paroxysmal positional vertigo 02/07/2016  . Overweight (BMI 25.0-29.9) 09/21/2014  . Encounter for Medicare annual wellness exam 09/21/2014  . Medication management 08/15/2013  . Abnormal glucose 01/26/2013  . Vitamin D deficiency 12/13/2012  . GERD   . Hyperlipidemia, mixed   . Depression, major, in remission ([place])   . Essential hypertension 09/09/2008    Screening Tests Immunization History  Administered Date(s) Administered  . Influenza, High Dose Seasonal PF 10/18/2014, 11/06/2015  . Influenza-Unspecified 10/13/2016  . Pneumococcal Conjugate-13 06/21/2014  . Pneumococcal Polysaccharide-23 01/21/2012  . Td 01/21/2012   Preventative care: Last colonoscopy: 07/2008 Last mammogram: 09/2016 Last pap smear/pelvic exam: remote, declines DEXA: 08/2014, normal, would like to repeat  Prior vaccinations: TD or Tdap: 2014  Influenza: 2018 Pneumococcal:  2014 Prevnar 13: 2016 Shingles/Zostavax: declines  Names of Other Physician/Practitioners you currently use: 1. [facility] here for primary care 2. Nicki Reaper, eye doctor, last visit 2018  3. , dentist, last visit - overdue to follow up  Patient Care Team: Unk Pinto, MD as PCP - General (Internal Medicine) Lafayette Dragon, MD (Inactive) as Consulting Physician (Gastroenterology)  SURGICAL HISTORY She  has a past surgical history that includes Back surgery; Appendectomy; Cesarean section; Abdominal hysterectomy (1981); and Salpingoophorectomy (1981 left). FAMILY HISTORY Her family history includes Diabetes in her mother; Heart disease in her father, mother, and sister; Hypertension in her father; Kidney disease in her mother. SOCIAL HISTORY She  reports that she quit smoking about 9 years ago. She has never used smokeless tobacco. She reports that she does not drink alcohol or use drugs.   MEDICARE WELLNESS OBJECTIVES: Physical activity: Current Exercise Habits: Home exercise routine;Structured exercise class, Type of exercise: walking;treadmill, Time (Minutes): 30, Frequency (Times/Week): 7, Weekly Exercise (Minutes/Week): 210, Intensity: Moderate, Exercise limited by: None identified Cardiac risk factors: Cardiac Risk Factors include: advanced age (>20men, >58 women);dyslipidemia;hypertension Depression/mood screen:   Depression screen [facility] 2/9 04/14/2017  Decreased Interest 0  Down, Depressed, Hopeless 0  PHQ - 2 Score 0    ADLs:  In your present state of health, do you have any difficulty performing the following activities: 04/14/2017 01/05/2017  Hearing? N N  Vision? N N  Difficulty concentrating or making decisions? N N  Walking or climbing stairs? N N  Dressing or bathing? N N  Doing errands, shopping? N N  Some recent data might be hidden     Cognitive Testing  Alert? Yes  Normal Appearance?Yes  Oriented to person? Yes  Place?  Yes   Time? Yes  Recall of three objects?  Yes  Can perform simple calculations? Yes  Displays appropriate judgment?Yes  Can read the correct time from a watch face?Yes  EOL planning: Does Patient Have a Medical Advance Directive?: No Would patient like information on creating a medical advance directive?: No - Patient declined  Review of Systems  Constitutional: Negative for malaise/fatigue and weight loss.  HENT: Negative for hearing loss and tinnitus.   Eyes: Negative for blurred vision and double vision.  Respiratory: Negative for cough, sputum production, shortness of breath and wheezing.   Cardiovascular: Negative for chest pain, palpitations, orthopnea, claudication, leg swelling and PND.  Gastrointestinal: Negative for abdominal pain, blood in stool, constipation, diarrhea, heartburn, melena, nausea and vomiting.  Genitourinary: Negative.   Musculoskeletal: Negative for falls, joint pain and myalgias.  Skin:  Negative for rash.  Neurological: Negative for dizziness, tingling, sensory change, weakness and headaches.  Endo/Heme/Allergies: Negative for polydipsia.  Psychiatric/Behavioral: Negative.  Negative for depression, memory loss, substance abuse and suicidal ideas. The patient is not nervous/anxious and does not have insomnia.   All other systems reviewed and are negative.    Objective:     Today's Vitals   04/14/17 1350  BP: 110/72  Pulse: 61  Temp: (!) 97.3 F (36.3 C)  SpO2: 98%  Weight: 154 lb (69.9 kg)  Height: 5\' 1"  (1.549 m)   Body mass index is 29.1 kg/m.  General appearance: alert, no distress, WD/WN, female HEENT: normocephalic, sclerae anicteric, TMs pearly, nares patent, no discharge or erythema, pharynx normal Oral cavity: MMM, no lesions Neck: supple, no lymphadenopathy, no thyromegaly, no masses Heart: RRR, normal S1, S2, no murmurs Lungs: CTA bilaterally, no wheezes, rhonchi, or rales Abdomen: +bs, soft, non tender, non distended, no masses,  no hepatomegaly, no splenomegaly Musculoskeletal: nontender, no swelling, no obvious deformity Extremities: no edema, no cyanosis, no clubbing Pulses: 2+ symmetric, upper and lower extremities, normal cap refill Neurological: alert, oriented x 3, CN2-12 intact, strength normal upper extremities and lower extremities, sensation normal throughout, DTRs 2+ throughout, no cerebellar signs, gait normal Psychiatric: normal affect, behavior normal, pleasant   Medicare Attestation I have personally reviewed: The patient's medical and social history Their use of alcohol, tobacco or illicit drugs Their current medications and supplements The patient's functional ability including ADLs,fall risks, home safety risks, cognitive, and hearing and visual impairment Diet and physical activities Evidence for depression or mood disorders  The patient's weight, height, BMI, and visual acuity have been recorded in the chart.  I have made referrals, counseling, and provided education to the patient based on review of the above and I have provided the patient with a written personalized care plan for preventive services.     Izora Ribas, NP   04/14/2017

## 2017-04-14 ENCOUNTER — Ambulatory Visit (INDEPENDENT_AMBULATORY_CARE_PROVIDER_SITE_OTHER): Payer: PPO | Admitting: Adult Health

## 2017-04-14 ENCOUNTER — Encounter: Payer: Self-pay | Admitting: Adult Health

## 2017-04-14 VITALS — BP 110/72 | HR 61 | Temp 97.3°F | Ht 61.0 in | Wt 154.0 lb

## 2017-04-14 DIAGNOSIS — E782 Mixed hyperlipidemia: Secondary | ICD-10-CM

## 2017-04-14 DIAGNOSIS — R6889 Other general symptoms and signs: Secondary | ICD-10-CM | POA: Diagnosis not present

## 2017-04-14 DIAGNOSIS — Z0001 Encounter for general adult medical examination with abnormal findings: Secondary | ICD-10-CM | POA: Diagnosis not present

## 2017-04-14 DIAGNOSIS — R7309 Other abnormal glucose: Secondary | ICD-10-CM | POA: Diagnosis not present

## 2017-04-14 DIAGNOSIS — Z79899 Other long term (current) drug therapy: Secondary | ICD-10-CM

## 2017-04-14 DIAGNOSIS — F325 Major depressive disorder, single episode, in full remission: Secondary | ICD-10-CM | POA: Diagnosis not present

## 2017-04-14 DIAGNOSIS — E039 Hypothyroidism, unspecified: Secondary | ICD-10-CM

## 2017-04-14 DIAGNOSIS — I10 Essential (primary) hypertension: Secondary | ICD-10-CM | POA: Diagnosis not present

## 2017-04-14 DIAGNOSIS — K2100 Gastro-esophageal reflux disease with esophagitis, without bleeding: Secondary | ICD-10-CM

## 2017-04-14 DIAGNOSIS — E559 Vitamin D deficiency, unspecified: Secondary | ICD-10-CM | POA: Diagnosis not present

## 2017-04-14 DIAGNOSIS — Z0000 Encounter for general adult medical examination without abnormal findings: Secondary | ICD-10-CM

## 2017-04-14 DIAGNOSIS — H8110 Benign paroxysmal vertigo, unspecified ear: Secondary | ICD-10-CM

## 2017-04-14 DIAGNOSIS — E2839 Other primary ovarian failure: Secondary | ICD-10-CM

## 2017-04-14 DIAGNOSIS — E663 Overweight: Secondary | ICD-10-CM

## 2017-04-14 LAB — BASIC METABOLIC PANEL WITH GFR
BUN: 19 mg/dL (ref 7–25)
CALCIUM: 9.4 mg/dL (ref 8.6–10.4)
CHLORIDE: 104 mmol/L (ref 98–110)
CO2: 33 mmol/L — AB (ref 20–32)
Creat: 0.74 mg/dL (ref 0.60–0.93)
GFR, Est African American: 94 mL/min/{1.73_m2} (ref >=60)
GFR, Est Non African American: 81 mL/min/{1.73_m2} (ref >=60)
GLUCOSE: 93 mg/dL (ref 65–99)
POTASSIUM: 4.3 mmol/L (ref 3.5–5.3)
Sodium: 144 mmol/L (ref 135–146)

## 2017-04-14 LAB — CBC WITH DIFFERENTIAL/PLATELET
Basophils Absolute: 48 cells/uL (ref 0–200)
Basophils Relative: 0.8 %
EOS PCT: 0.8 %
Eosinophils Absolute: 48 cells/uL (ref 15–500)
HCT: 38.5 % (ref 35.0–45.0)
Hemoglobin: 13 g/dL (ref 11.7–15.5)
Lymphs Abs: 1512 cells/uL (ref 850–3900)
MCH: 29.7 pg (ref 27.0–33.0)
MCHC: 33.8 g/dL (ref 32.0–36.0)
MCV: 88.1 fL (ref 80.0–100.0)
MPV: 11 fL (ref 7.5–12.5)
Monocytes Relative: 6.8 %
NEUTROS PCT: 66.4 %
Neutro Abs: 3984 cells/uL (ref 1500–7800)
PLATELETS: 301 10*3/uL (ref 140–400)
RBC: 4.37 10*6/uL (ref 3.80–5.10)
RDW: 12.4 % (ref 11.0–15.0)
TOTAL LYMPHOCYTE: 25.2 %
WBC mixed population: 408 cells/uL (ref 200–950)
WBC: 6 10*3/uL (ref 3.8–10.8)

## 2017-04-14 LAB — LIPID PANEL
CHOL/HDL RATIO: 2.6 (calc) (ref <5.0)
CHOLESTEROL: 120 mg/dL (ref <200)
HDL: 46 mg/dL — AB (ref >50)
LDL Cholesterol (Calc): 49 mg/dL (calc)
Non-HDL Cholesterol (Calc): 74 mg/dL (calc) (ref <130)
TRIGLYCERIDES: 177 mg/dL — AB (ref <150)

## 2017-04-14 LAB — HEPATIC FUNCTION PANEL
AG Ratio: 2.1 (calc) (ref 1.0–2.5)
ALBUMIN MSPROF: 4.6 g/dL (ref 3.6–5.1)
ALT: 15 U/L (ref 6–29)
AST: 22 U/L (ref 10–35)
Alkaline phosphatase (APISO): 59 U/L (ref 33–130)
BILIRUBIN DIRECT: 0.1 mg/dL (ref 0.0–0.2)
Globulin: 2.2 g/dL (calc) (ref 1.9–3.7)
Indirect Bilirubin: 0.3 mg/dL (calc) (ref 0.2–1.2)
TOTAL PROTEIN: 6.8 g/dL (ref 6.1–8.1)
Total Bilirubin: 0.4 mg/dL (ref 0.2–1.2)

## 2017-04-14 LAB — TSH: TSH: 2.28 mIU/L (ref 0.40–4.50)

## 2017-04-14 NOTE — Patient Instructions (Signed)
Can try melatonin 5mg-15 mg at night for sleep, can also do benadryl 25-50mg at night for sleep.  If this does not help we can try prescription medication.  Also here is some information about good sleep hygiene.   Insomnia Insomnia is frequent trouble falling and/or staying asleep. Insomnia can be a long term problem or a short term problem. Both are common. Insomnia can be a short term problem when the wakefulness is related to a certain stress or worry. Long term insomnia is often related to ongoing stress during waking hours and/or poor sleeping habits. Overtime, sleep deprivation itself can make the problem worse. Every little thing feels more severe because you are overtired and your ability to cope is decreased. CAUSES  Stress, anxiety, and depression. Poor sleeping habits. Distractions such as TV in the bedroom. Naps close to bedtime. Engaging in emotionally charged conversations before bed. Technical reading before sleep. Alcohol and other sedatives. They may make the problem worse. They can hurt normal sleep patterns and normal dream activity. Stimulants such as caffeine for several hours prior to bedtime. Pain syndromes and shortness of breath can cause insomnia. Exercise late at night. Changing time zones may cause sleeping problems (jet lag). It is sometimes helpful to have someone observe your sleeping patterns. They should look for periods of not breathing during the night (sleep apnea). They should also look to see how long those periods last. If you live alone or observers are uncertain, you can also be observed at a sleep clinic where your sleep patterns will be professionally monitored. Sleep apnea requires a checkup and treatment. Give your caregivers your medical history. Give your caregivers observations your family has made about your sleep.  SYMPTOMS  Not feeling rested in the morning. Anxiety and restlessness at bedtime. Difficulty falling and staying asleep. TREATMENT   Your caregiver may prescribe treatment for an underlying medical disorders. Your caregiver can give advice or help if you are using alcohol or other drugs for self-medication. Treatment of underlying problems will usually eliminate insomnia problems. Medications can be prescribed for short time use. They are generally not recommended for lengthy use. Over-the-counter sleep medicines are not recommended for lengthy use. They can be habit forming. You can promote easier sleeping by making lifestyle changes such as: Using relaxation techniques that help with breathing and reduce muscle tension. Exercising earlier in the day. Changing your diet and the time of your last meal. No night time snacks. Establish a regular time to go to bed. Counseling can help with stressful problems and worry. Soothing music and white noise may be helpful if there are background noises you cannot remove. Stop tedious detailed work at least one hour before bedtime. HOME CARE INSTRUCTIONS  Keep a diary. Inform your caregiver about your progress. This includes any medication side effects. See your caregiver regularly. Take note of: Times when you are asleep. Times when you are awake during the night. The quality of your sleep. How you feel the next day. This information will help your caregiver care for you. Get out of bed if you are still awake after 15 minutes. Read or do some quiet activity. Keep the lights down. Wait until you feel sleepy and go back to bed. Keep regular sleeping and waking hours. Avoid naps. Exercise regularly. Avoid distractions at bedtime. Distractions include watching television or engaging in any intense or detailed activity like attempting to balance the household checkbook. Develop a bedtime ritual. Keep a familiar routine of bathing, brushing your teeth,   at bedtime. Distractions include watching television or engaging in any intense or detailed activity like attempting to balance the household checkbook.  Develop a bedtime ritual. Keep a familiar routine of bathing, brushing your teeth, climbing into bed at the same time each night, listening to  soothing music. Routines increase the success of falling to sleep faster.  Use relaxation techniques. This can be using breathing and muscle tension release routines. It can also include visualizing peaceful scenes. You can also help control troubling or intruding thoughts by keeping your mind occupied with boring or repetitive thoughts like the old concept of counting sheep. You can make it more creative like imagining planting one beautiful flower after another in your backyard garden.  During your day, work to eliminate stress. When this is not possible use some of the previous suggestions to help reduce the anxiety that accompanies stressful situations. MAKE SURE YOU:   Understand these instructions.  Will watch your condition.  Will get help right away if you are not doing well or get worse. Document Released: 12/21/1999 Document Revised: 03/17/2011 Document Reviewed: 01/20/2007 [facility] Patient Information 2015 [place], [place]. This information is not intended to replace advice given to you by your health care provider. Make sure you discuss any questions you have with your health care provider.     When it comes to diets, agreement about the perfect plan isn't easy to find, even among the experts. Experts at the [employer] developed an idea known as the Healthy Eating Plate. Just imagine a plate divided into logical, healthy portions.  The emphasis is on diet quality:  Load up on vegetables and fruits - one-half of your plate: Aim for color and variety, and remember that potatoes don't count.  Go for whole grains - one-quarter of your plate: Whole wheat, barley, wheat berries, quinoa, oats, brown rice, and foods made with them. If you want pasta, go with whole wheat pasta.  Protein power - one-quarter of your plate: Fish, chicken, beans, and nuts are all healthy, versatile protein sources. Limit red meat.  The diet, however, does go beyond the plate, offering  a few other suggestions.  Use healthy plant oils, such as olive, canola, soy, corn, sunflower and peanut. Check the labels, and avoid partially hydrogenated oil, which have unhealthy trans fats.  If you're thirsty, drink water. Coffee and tea are good in moderation, but skip sugary drinks and limit milk and dairy products to one or two daily servings.  The type of carbohydrate in the diet is more important than the amount. Some sources of carbohydrates, such as vegetables, fruits, whole grains, and beans-are healthier than others.  Finally, stay active.

## 2017-04-24 ENCOUNTER — Other Ambulatory Visit: Payer: Self-pay | Admitting: Internal Medicine

## 2017-05-11 ENCOUNTER — Other Ambulatory Visit: Payer: Self-pay | Admitting: Internal Medicine

## 2017-05-11 DIAGNOSIS — G8929 Other chronic pain: Secondary | ICD-10-CM

## 2017-05-11 DIAGNOSIS — M25561 Pain in right knee: Principal | ICD-10-CM

## 2017-05-12 ENCOUNTER — Other Ambulatory Visit: Payer: Self-pay | Admitting: Internal Medicine

## 2017-05-14 DIAGNOSIS — M25562 Pain in left knee: Secondary | ICD-10-CM | POA: Diagnosis not present

## 2017-06-07 ENCOUNTER — Other Ambulatory Visit: Payer: Self-pay | Admitting: Internal Medicine

## 2017-07-08 ENCOUNTER — Encounter: Payer: Self-pay | Admitting: Internal Medicine

## 2017-07-10 ENCOUNTER — Other Ambulatory Visit: Payer: Self-pay | Admitting: Internal Medicine

## 2017-07-16 ENCOUNTER — Encounter: Payer: Self-pay | Admitting: Internal Medicine

## 2017-07-20 DIAGNOSIS — J040 Acute laryngitis: Secondary | ICD-10-CM | POA: Diagnosis not present

## 2017-07-20 DIAGNOSIS — Z87891 Personal history of nicotine dependence: Secondary | ICD-10-CM | POA: Diagnosis not present

## 2017-07-20 DIAGNOSIS — R499 Unspecified voice and resonance disorder: Secondary | ICD-10-CM | POA: Diagnosis not present

## 2017-07-20 DIAGNOSIS — J029 Acute pharyngitis, unspecified: Secondary | ICD-10-CM | POA: Diagnosis not present

## 2017-08-11 ENCOUNTER — Other Ambulatory Visit: Payer: Self-pay | Admitting: Internal Medicine

## 2017-08-11 ENCOUNTER — Ambulatory Visit (INDEPENDENT_AMBULATORY_CARE_PROVIDER_SITE_OTHER): Payer: PPO | Admitting: Internal Medicine

## 2017-08-11 ENCOUNTER — Encounter: Payer: Self-pay | Admitting: Internal Medicine

## 2017-08-11 VITALS — BP 116/66 | HR 60 | Temp 97.5°F | Resp 16 | Ht 61.0 in | Wt 152.8 lb

## 2017-08-11 DIAGNOSIS — E559 Vitamin D deficiency, unspecified: Secondary | ICD-10-CM

## 2017-08-11 DIAGNOSIS — E782 Mixed hyperlipidemia: Secondary | ICD-10-CM | POA: Diagnosis not present

## 2017-08-11 DIAGNOSIS — E039 Hypothyroidism, unspecified: Secondary | ICD-10-CM | POA: Diagnosis not present

## 2017-08-11 DIAGNOSIS — R7309 Other abnormal glucose: Secondary | ICD-10-CM

## 2017-08-11 DIAGNOSIS — Z0001 Encounter for general adult medical examination with abnormal findings: Secondary | ICD-10-CM

## 2017-08-11 DIAGNOSIS — F325 Major depressive disorder, single episode, in full remission: Secondary | ICD-10-CM

## 2017-08-11 DIAGNOSIS — R7303 Prediabetes: Secondary | ICD-10-CM | POA: Diagnosis not present

## 2017-08-11 DIAGNOSIS — Z79899 Other long term (current) drug therapy: Secondary | ICD-10-CM

## 2017-08-11 DIAGNOSIS — Z1212 Encounter for screening for malignant neoplasm of rectum: Secondary | ICD-10-CM

## 2017-08-11 DIAGNOSIS — I10 Essential (primary) hypertension: Secondary | ICD-10-CM | POA: Diagnosis not present

## 2017-08-11 DIAGNOSIS — Z87891 Personal history of nicotine dependence: Secondary | ICD-10-CM

## 2017-08-11 DIAGNOSIS — Z8249 Family history of ischemic heart disease and other diseases of the circulatory system: Secondary | ICD-10-CM

## 2017-08-11 DIAGNOSIS — Z1211 Encounter for screening for malignant neoplasm of colon: Secondary | ICD-10-CM

## 2017-08-11 DIAGNOSIS — Z136 Encounter for screening for cardiovascular disorders: Secondary | ICD-10-CM | POA: Diagnosis not present

## 2017-08-11 NOTE — Progress Notes (Signed)
[facility] Unk Pinto, M.D.     Uvaldo Bristle. Silverio Lay, P.A.-C Liane Comber, [place] [address] [place], [place] [address] Telephone [phone] Telefax [phone] Annual Screening/Preventative Visit & Comprehensive Evaluation &  Examination     This very nice 71 y.o. [facility] presents for a Screening /Preventative Visit & comprehensive evaluation and management of multiple medical co-morbidities.  Patient has been followed for HTN, HLD, T2_NIDDM  Prediabetes  and Vitamin D Deficiency.      HTN predates circa 1995. Patient's BP has been controlled at home and patient denies any cardiac symptoms as chest pain, palpitations, shortness of breath, dizziness or ankle swelling. Today's BP is at goal -  116/66.      Patient's hyperlipidemia is controlled with diet and medications. Patient denies myalgias or other medication SE's. Last lipids were at goal albeit elevated Trig's: Lab Results  Component Value Date   CHOL 120 04/14/2017   HDL 46 (L) 04/14/2017   LDLCALC 49 04/14/2017   TRIG 177 (H) 04/14/2017   CHOLHDL 2.6 04/14/2017      Patient has hx/o prediabetes predating  (A1c 5.7% in 2011)  and patient denies reactive hypoglycemic symptoms, visual blurring, diabetic polys, or paresthesias. Last A1c was at goal: Lab Results  Component Value Date   HGBA1C 5.5 01/05/2017      Finally, patient has history of Vitamin D Deficiency ("40"/2008) and last Vitamin D was atg goal: Lab Results  Component Value Date   VD25OH 32 01/05/2017   Current Outpatient Medications on File Prior to Visit  Medication Sig  . ALPRAZolam (XANAX) 1 MG tablet TAKE 1/2 TO 1 TABLET 2-3 TIMES DAILY ONLY IF NEED FOR ANXIETY/ATTACK,TRY TO LIMIT TO 5 DAYS/WEEK  . aspirin 81 MG tablet Take 81 mg by mouth daily.  [name] atorvastatin (LIPITOR) 80 MG tablet TAKE 1/2 TO 1 TABLET DAILY OR AS DIRECTED FOR CHOLESTEROL  . Cholecalciferol (VITAMIN D PO)  Take 2,000 Int'l Units by mouth 2 (two) times daily.   . cyclobenzaprine (FLEXERIL) 10 MG tablet Take 1/2 to 1 tablets 2 to 3 x / day if needed for Muscle Spasms or Headaches  . ezetimibe (ZETIA) 10 MG tablet TAKE 1 TABLET BY MOUTH EVERY DAY  . furosemide (LASIX) 40 MG tablet TAKE 1 TABLET 1 OR 2 X DAILY FOR BP & FLUID  . GINKGO BILOBA PLUS PO Take 60 mg by mouth daily.  [name] levothyroxine (SYNTHROID, LEVOTHROID) 75 MCG tablet TAKE 1 TABLET BY MOUTH EVERY DAY (Patient taking differently: TAKE 1 TABLET BY MOUTH MON-FRI AND 1/2 TABLET ON SAT & SUN)  . OVER THE COUNTER MEDICATION Drinks Petra Kuba Made protein drink after exercise.  . ranitidine (ZANTAC) 300 MG tablet TAKE 1 TABLET IN THE EVENING  . vitamin C (ASCORBIC ACID) 500 MG tablet Take 500 mg by mouth daily.   No current facility-administered medications on file prior to visit.    Allergies  Allergen Reactions  . Aspirin     REACTION: GI ulcers  . Fish Oil     Allergic to fish oil and shell fish  . Morphine And Related   . Sulfa Antibiotics   . Vicodin [Hydrocodone-Acetaminophen] Nausea Only   Past Medical History:  Diagnosis Date  . Vitamin D deficiency    Health Maintenance  Topic Date Due  . INFLUENZA VACCINE  08/06/2017  . COLONOSCOPY  07/07/2018  . MAMMOGRAM  09/27/2018  . TETANUS/TDAP  01/20/2022  . DEXA SCAN  Completed  . Hepatitis C Screening  Completed  . PNA vac Low Risk Adult  Completed   Immunization History  Administered Date(s) Administered  . Influenza, High Dose Seasonal PF 10/18/2014, 11/06/2015  . Influenza-Unspecified 10/13/2016  . Pneumococcal Conjugate-13 06/21/2014  . Pneumococcal Polysaccharide-23 01/21/2012  . Td 01/21/2012   Last Colon - 07/2008 - Dr Delfin Edis - recc 10 yr f/u due July 2020  Last MGM - due 09/26/2017  Past Surgical History:  Procedure Laterality Date  . ABDOMINAL HYSTERECTOMY  1981   partial  . APPENDECTOMY    . BACK SURGERY    . CESAREAN SECTION    . SALPINGOOPHORECTOMY   1981 left   1982 right   Family History  Problem Relation Age of Onset  . Diabetes Mother   . Kidney disease Mother   . Heart disease Mother   . Hypertension Father   . Heart disease Father   . Heart disease Sister    Social History   Tobacco Use  . Smoking status: Former Smoker    Last attempt to quit: 01/07/2008    Years since quitting: 9.6  . Smokeless tobacco: Never Used  Substance Use Topics  . Alcohol use: No  . Drug use: No    ROS Constitutional: Denies fever, chills, weight loss/gain, headaches, insomnia,  night sweats, and change in appetite. Does c/o fatigue. Eyes: Denies redness, blurred vision, diplopia, discharge, itchy, watery eyes.  ENT: Denies discharge, congestion, post nasal drip, epistaxis, sore throat, earache, hearing loss, dental pain, Tinnitus, Vertigo, Sinus pain, snoring.  Cardio: Denies chest pain, palpitations, irregular heartbeat, syncope, dyspnea, diaphoresis, orthopnea, PND, claudication, edema Respiratory: denies cough, dyspnea, DOE, pleurisy, hoarseness, laryngitis, wheezing.  Gastrointestinal: Denies dysphagia, heartburn, reflux, water brash, pain, cramps, nausea, vomiting, bloating, diarrhea, constipation, hematemesis, melena, hematochezia, jaundice, hemorrhoids Genitourinary: Denies dysuria, frequency, urgency, nocturia, hesitancy, discharge, hematuria, flank pain Breast: Breast lumps, nipple discharge, bleeding.  Musculoskeletal: Denies arthralgia, myalgia, stiffness, Jt. Swelling, pain, limp, and strain/sprain. Denies falls. Skin: Denies puritis, rash, hives, warts, acne, eczema, changing in skin lesion Neuro: No weakness, tremor, incoordination, spasms, paresthesia, pain Psychiatric: Denies confusion, memory loss, sensory loss. Denies Depression. Endocrine: Denies change in weight, skin, hair change, nocturia, and paresthesia, diabetic polys, visual blurring, hyper / hypo glycemic episodes.  Heme/Lymph: No excessive bleeding, bruising, enlarged  lymph nodes.  Physical Exam  BP 116/66   Pulse 60   Temp (!) 97.5 F (36.4 C)   Resp 16   Ht 5\' 1"  (1.549 m)   Wt 152 lb 12.8 oz (69.3 kg)   BMI 28.87 kg/m   General Appearance: Well nourished, well groomed and in no apparent distress.  Eyes: PERRLA, EOMs, conjunctiva no swelling or erythema, normal fundi and vessels. Sinuses: No frontal/maxillary tenderness ENT/Mouth: EACs patent / TMs  nl. Nares clear without erythema, swelling, mucoid exudates. Oral hygiene is good. No erythema, swelling, or exudate. Tongue normal, non-obstructing. Tonsils not swollen or erythematous. Hearing normal.  Neck: Supple, thyroid not palpable. No bruits, nodes or JVD. Respiratory: Respiratory effort normal.  BS equal and clear bilateral without rales, rhonci, wheezing or stridor. Cardio: Heart sounds are normal with regular rate and rhythm and no murmurs, rubs or gallops. Peripheral pulses are normal and equal bilaterally without edema. No aortic or femoral bruits. Chest: symmetric with normal excursions and percussion. Breasts: Symmetric, without lumps, nipple discharge, retractions, or fibrocystic changes.  Abdomen: Flat, soft with bowel sounds active. Nontender, no guarding, rebound, hernias, masses, or organomegaly.  Lymphatics: Non  tender without lymphadenopathy.  Genitourinary:  Musculoskeletal: Full ROM all peripheral extremities, joint stability, 5/5 strength, and normal gait. Skin: Warm and dry without rashes, lesions, cyanosis, clubbing or  ecchymosis.  Neuro: Cranial nerves intact, reflexes equal bilaterally. Normal muscle tone, no cerebellar symptoms. Sensation intact.  Pysch: Alert and oriented X 3, normal affect, Insight and Judgment appropriate.   Assessment and Plan  1. Annual Preventative Screening Examination  1. Encounter for general adult medical examination with abnormal findings   2. Essential hypertension  - EKG 12-Lead - Urinalysis, Routine w reflex microscopic -  Microalbumin / creatinine urine ratio - CBC with Differential/Platelet - COMPLETE METABOLIC PANEL WITH GFR - Magnesium - TSH  3. Hyperlipidemia, mixed  - EKG 12-Lead - Lipid panel - TSH  4. Abnormal glucose  - EKG 12-Lead - Hemoglobin A1c - Insulin, random  5. Vitamin D deficiency  - VITAMIN D 25 Hydroxyl  6. Prediabetes  - EKG 12-Lead - Hemoglobin A1c - Insulin, random  7. Hypothyroidism  - TSH  8. Screening for colorectal cancer  - POC Hemoccult Bld/Stl  9. Screening for ischemic heart disease  - EKG 12-Lead  10. Former smoker  - EKG 12-Lead  11. 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 Medication management  - Urinalysis, Routine w reflex microscopic - Microalbumin / creatinine urine ratio - CBC with Differential/Platelet - COMPLETE METABOLIC PANEL WITH GFR - Magnesium - Lipid panel - TSH - Hemoglobin A1c - Insulin, random - VITAMIN D 25 Hydroxyl        Patient was counseled in prudent diet to achieve/maintain BMI less than 25 for weight control, BP monitoring, regular exercise and medications. Discussed med's effects and SE's. Screening labs and tests as requested with regular follow-up as recommended. Over 40 minutes of exam, counseling, chart review and high complex critical decision making was performed.

## 2017-08-11 NOTE — Patient Instructions (Signed)

## 2017-08-12 LAB — VITAMIN D 25 HYDROXY (VIT D DEFICIENCY, FRACTURES): VIT D 25 HYDROXY: 79 ng/mL (ref 30–100)

## 2017-08-12 LAB — URINALYSIS, ROUTINE W REFLEX MICROSCOPIC
Bilirubin Urine: NEGATIVE
GLUCOSE, UA: NEGATIVE
HGB URINE DIPSTICK: NEGATIVE
KETONES UR: NEGATIVE
Leukocytes, UA: NEGATIVE
NITRITE: NEGATIVE
PH: 7.5 (ref 5.0–8.0)
Protein, ur: NEGATIVE
Specific Gravity, Urine: 1.012 (ref 1.001–1.03)

## 2017-08-12 LAB — COMPLETE METABOLIC PANEL WITH GFR
AG RATIO: 2.2 (calc) (ref 1.0–2.5)
ALT: 20 U/L (ref 6–29)
AST: 27 U/L (ref 10–35)
Albumin: 4.9 g/dL (ref 3.6–5.1)
Alkaline phosphatase (APISO): 68 U/L (ref 33–130)
BILIRUBIN TOTAL: 0.5 mg/dL (ref 0.2–1.2)
BUN: 13 mg/dL (ref 7–25)
CO2: 30 mmol/L (ref 20–32)
Calcium: 9.8 mg/dL (ref 8.6–10.4)
Chloride: 100 mmol/L (ref 98–110)
Creat: 0.74 mg/dL (ref 0.60–0.93)
GFR, Est African American: 94 mL/min/{1.73_m2} (ref >=60)
GFR, Est Non African American: 81 mL/min/{1.73_m2} (ref >=60)
GLUCOSE: 95 mg/dL (ref 65–99)
Globulin: 2.2 g/dL (calc) (ref 1.9–3.7)
POTASSIUM: 4.1 mmol/L (ref 3.5–5.3)
Sodium: 143 mmol/L (ref 135–146)
Total Protein: 7.1 g/dL (ref 6.1–8.1)

## 2017-08-12 LAB — CBC WITH DIFFERENTIAL/PLATELET
BASOS ABS: 50 {cells}/uL (ref 0–200)
Basophils Relative: 0.9 %
EOS PCT: 1.4 %
Eosinophils Absolute: 78 cells/uL (ref 15–500)
HEMATOCRIT: 38.8 % (ref 35.0–45.0)
Hemoglobin: 13.4 g/dL (ref 11.7–15.5)
Lymphs Abs: 1534 cells/uL (ref 850–3900)
MCH: 30.3 pg (ref 27.0–33.0)
MCHC: 34.5 g/dL (ref 32.0–36.0)
MCV: 87.8 fL (ref 80.0–100.0)
MONOS PCT: 7.2 %
MPV: 11.1 fL (ref 7.5–12.5)
NEUTROS PCT: 63.1 %
Neutro Abs: 3534 cells/uL (ref 1500–7800)
PLATELETS: 335 10*3/uL (ref 140–400)
RBC: 4.42 10*6/uL (ref 3.80–5.10)
RDW: 12.3 % (ref 11.0–15.0)
Total Lymphocyte: 27.4 %
WBC mixed population: 403 cells/uL (ref 200–950)
WBC: 5.6 10*3/uL (ref 3.8–10.8)

## 2017-08-12 LAB — LIPID PANEL
Cholesterol: 149 mg/dL (ref <200)
HDL: 49 mg/dL — AB (ref >50)
LDL Cholesterol (Calc): 79 mg/dL (calc)
Non-HDL Cholesterol (Calc): 100 mg/dL (calc) (ref <130)
Total CHOL/HDL Ratio: 3 (calc) (ref <5.0)
Triglycerides: 116 mg/dL (ref <150)

## 2017-08-12 LAB — HEMOGLOBIN A1C
EAG (MMOL/L): 6.2 (calc)
Hgb A1c MFr Bld: 5.5 % of total Hgb (ref <5.7)
Mean Plasma Glucose: 111 (calc)

## 2017-08-12 LAB — MICROALBUMIN / CREATININE URINE RATIO
Creatinine, Urine: 40 mg/dL (ref 20–275)
Microalb Creat Ratio: 13 mcg/mg creat (ref <30)
Microalb, Ur: 0.5 mg/dL

## 2017-08-12 LAB — MAGNESIUM: MAGNESIUM: 2.3 mg/dL (ref 1.5–2.5)

## 2017-08-12 LAB — INSULIN, RANDOM: Insulin: 4.8 u[IU]/mL (ref 2.0–19.6)

## 2017-08-12 LAB — TSH: TSH: 2.27 m[IU]/L (ref 0.40–4.50)

## 2017-08-15 ENCOUNTER — Other Ambulatory Visit: Payer: Self-pay | Admitting: Adult Health

## 2017-08-16 ENCOUNTER — Encounter: Payer: Self-pay | Admitting: Internal Medicine

## 2017-08-17 ENCOUNTER — Other Ambulatory Visit: Payer: Self-pay | Admitting: Internal Medicine

## 2017-08-17 DIAGNOSIS — I10 Essential (primary) hypertension: Secondary | ICD-10-CM

## 2017-09-10 ENCOUNTER — Other Ambulatory Visit: Payer: Self-pay | Admitting: Internal Medicine

## 2017-09-14 ENCOUNTER — Other Ambulatory Visit: Payer: Self-pay | Admitting: Internal Medicine

## 2017-09-18 DIAGNOSIS — H00025 Hordeolum internum left lower eyelid: Secondary | ICD-10-CM | POA: Diagnosis not present

## 2017-09-29 ENCOUNTER — Other Ambulatory Visit: Payer: Self-pay | Admitting: Internal Medicine

## 2017-09-29 DIAGNOSIS — Z1231 Encounter for screening mammogram for malignant neoplasm of breast: Secondary | ICD-10-CM

## 2017-10-09 ENCOUNTER — Other Ambulatory Visit: Payer: Self-pay | Admitting: Physician Assistant

## 2017-11-17 ENCOUNTER — Other Ambulatory Visit: Payer: Self-pay | Admitting: Internal Medicine

## 2017-11-17 DIAGNOSIS — G4700 Insomnia, unspecified: Secondary | ICD-10-CM

## 2017-11-17 MED ORDER — ALPRAZOLAM 1 MG PO TABS: ORAL_TABLET | ORAL | 0 refills | Status: DC

## 2017-11-20 ENCOUNTER — Ambulatory Visit: Payer: PPO

## 2017-11-20 ENCOUNTER — Other Ambulatory Visit: Payer: PPO

## 2017-11-24 ENCOUNTER — Ambulatory Visit: Payer: Self-pay | Admitting: Adult Health

## 2017-11-30 NOTE — Progress Notes (Signed)
FOLLOW UP  Assessment and Plan:   Hypertension Well controlled with current medications  Monitor blood pressure at home; patient to call if consistently greater than 130/80 Continue DASH diet.   Reminder to go to the ER if any CP, SOB, nausea, dizziness, severe HA, changes vision/speech, left arm numbness and tingling and jaw pain.  Cholesterol Currently at goal; continue statin + zeita Continue low cholesterol diet and exercise.  Check lipid panel.   Other abnormal glucose Recent A1Cs at goal Discussed diet/exercise, weight management  Defer A1C; check CMP  Overweight Long discussion about weight loss, diet, and exercise Recommended diet heavy in fruits and veggies and low in animal meats, cheeses, and dairy products, appropriate calorie intake Discussed ideal weight for height and initial weight goal (150lb) Will follow up in 3 months  Hypothyroidism continue medications the same pending lab results reminded to take on an empty stomach 30-61mins before food.  check TSH level  Vitamin D Def At goal at last visit; continue supplementation to maintain goal of 70-100 Defer Vit D level  Anxiety/insomnia Has been on benzo since 90s, discussed benzos and risks, she wishes to defer taper off to alternate medication at this time but will consider after the holidays.    Continue diet and meds as discussed. Further disposition pending results of labs. Discussed med's effects and SE's.   Over 30 minutes of exam, counseling, chart review, and critical decision making was performed.   Future Appointments  Date Time Provider [facility]  12/16/2017  1:10 PM [facility] MM 3 [facility]  01/13/2018  2:00 PM [facility] DX DEXA 1 [facility]  02/24/2018  2:30 PM Unk Pinto, MD [facility] None  04/27/2018  2:00 PM Liane Comber, NP [facility] None  08/31/2018  2:00 PM Unk Pinto, MD [facility] None     ----------------------------------------------------------------------------------------------------------------------  HPI 72 y.o. female  presents for 3 month follow up on hypertension, cholesterol, abnormal glucose, hypothyroid, depression/axiety, weight and vitamin D deficiency.   She recently had her first great grandson born.   she has a diagnosis of depression/anxiety in remission and is currently on xanax PRN, reports she is taking at night since 90s for sleep, reports symptoms are well controlled on current regimen. she takes with tylenol PM.   BMI is Body mass index is 29.29 kg/m., she has been working on diet and exercise, has been walking daily.  Wt Readings from Last 3 Encounters:  12/01/17 155 lb (70.3 kg)  08/11/17 152 lb 12.8 oz (69.3 kg)  04/14/17 154 lb (69.9 kg)   Her blood pressure has been controlled at home, today their BP is BP: 110/68  She does workout. She denies chest pain, shortness of breath, dizziness.   She is on cholesterol medication Atorvastatin 40 mg every other day to 2-3 days a week and Zetia and denies myalgias. Her cholesterol is at goal. The cholesterol last visit was:   Lab Results  Component Value Date   CHOL 149 08/11/2017   HDL 49 (L) 08/11/2017   LDLCALC 79 08/11/2017   TRIG 116 08/11/2017   CHOLHDL 3.0 08/11/2017    She has been working on diet and exercise for glucose management, and denies increased appetite, nausea, paresthesia of the feet, polydipsia, polyuria and visual disturbances. Last A1C in the office was:  Lab Results  Component Value Date   HGBA1C 5.5 08/11/2017   She is on thyroid medication. Her medication was not changed last visit.   Lab Results  Component Value  Date   TSH 2.27 08/11/2017   Patient is on Vitamin D supplement.   Lab Results  Component Value Date   VD25OH 79 08/11/2017       Current Medications:  Current Outpatient Medications on File Prior to Visit  Medication Sig  . ALPRAZolam (XANAX) 1  MG tablet Take 1/2 to 1 tablet ONLY if needed at hour of Sleep - limit to 5 days /week  . aspirin 81 MG tablet Take 81 mg by mouth daily.  [name] atorvastatin (LIPITOR) 80 MG tablet TAKE 1/2 TO 1 TABLET DAILY OR AS DIRECTED FOR CHOLESTEROL  . Cholecalciferol (VITAMIN D PO) Take 2,000 Int'l Units by mouth 2 (two) times daily.   . cyclobenzaprine (FLEXERIL) 10 MG tablet Take 1/2 to 1 tablets 2 to 3 x / day if needed for Muscle Spasms or Headaches  . ezetimibe (ZETIA) 10 MG tablet TAKE 1 TABLET BY MOUTH EVERY DAY  . furosemide (LASIX) 40 MG tablet TAKE 1 TABLET 1 OR 2 X DAILY FOR BP & FLUID  . GINKGO BILOBA PLUS PO Take 60 mg by mouth daily.  [name] levothyroxine (SYNTHROID, LEVOTHROID) 75 MCG tablet TAKE 1 TABLET BY MOUTH MON-FRI AND 1/2 TABLET ON SAT & SUN  . OVER THE COUNTER MEDICATION Drinks Petra Kuba Made protein drink after exercise.  . vitamin C (ASCORBIC ACID) 500 MG tablet Take 1,000 mg by mouth daily.   . ranitidine (ZANTAC) 300 MG tablet TAKE 1 TABLET IN THE EVENING   No current facility-administered medications on file prior to visit.      Allergies:  Allergies  Allergen Reactions  . Aspirin     REACTION: GI ulcers  . Fish Oil     Allergic to fish oil and shell fish  . Morphine And Related   . Sulfa Antibiotics   . Vicodin [Hydrocodone-Acetaminophen] Nausea Only     Medical History:  Past Medical History:  Diagnosis Date  . Vitamin D deficiency    Family history- Reviewed and unchanged Social history- Reviewed and unchanged   Review of Systems:  Review of Systems  Constitutional: Negative for malaise/fatigue and weight loss.  HENT: Negative for hearing loss and tinnitus.   Eyes: Negative for blurred vision and double vision.  Respiratory: Negative for cough, shortness of breath and wheezing.   Cardiovascular: Negative for chest pain, palpitations, orthopnea, claudication and leg swelling.  Gastrointestinal: Negative for abdominal pain, blood in stool, constipation, diarrhea,  heartburn, melena, nausea and vomiting.  Genitourinary: Negative.   Musculoskeletal: Negative for joint pain and myalgias.  Skin: Negative for rash.  Neurological: Negative for dizziness, tingling, sensory change, weakness and headaches.  Endo/Heme/Allergies: Negative for polydipsia.  Psychiatric/Behavioral: Negative for depression and substance abuse. The patient has insomnia. The patient is not nervous/anxious.   All other systems reviewed and are negative.     Physical Exam: BP 110/68   Pulse 68   Temp (!) 97.2 F (36.2 C)   Ht 5\' 1"  (1.549 m)   Wt 155 lb (70.3 kg)   SpO2 96%   BMI 29.29 kg/m  Wt Readings from Last 3 Encounters:  12/01/17 155 lb (70.3 kg)  08/11/17 152 lb 12.8 oz (69.3 kg)  04/14/17 154 lb (69.9 kg)   General Appearance: Well nourished, in no apparent distress. Eyes: PERRLA, EOMs, conjunctiva no swelling or erythema Sinuses: No Frontal/maxillary tenderness ENT/Mouth: Ext aud canals clear, TMs without erythema, bulging. No erythema, swelling, or exudate on post pharynx.  Tonsils not swollen or erythematous. Hearing normal.  Neck: Supple, thyroid normal.  Respiratory: Respiratory effort normal, BS equal bilaterally without rales, rhonchi, wheezing or stridor.  Cardio: RRR with no MRGs. Brisk peripheral pulses without edema.  Abdomen: Soft, + BS.  Non tender, no guarding, rebound, hernias, masses. Lymphatics: Non tender without lymphadenopathy.  Musculoskeletal: Full ROM, 5/5 strength, Normal gait Skin: Warm, dry without rashes, lesions, ecchymosis.  Neuro: Cranial nerves intact. No cerebellar symptoms.  Psych: Awake and oriented X 3, normal affect, Insight and Judgment appropriate.    Izora Ribas, NP 3:54 PM [facility]

## 2017-12-01 ENCOUNTER — Encounter: Payer: Self-pay | Admitting: Adult Health

## 2017-12-01 ENCOUNTER — Ambulatory Visit (INDEPENDENT_AMBULATORY_CARE_PROVIDER_SITE_OTHER): Payer: PPO | Admitting: Adult Health

## 2017-12-01 VITALS — BP 110/68 | HR 68 | Temp 97.2°F | Ht 61.0 in | Wt 155.0 lb

## 2017-12-01 DIAGNOSIS — R7309 Other abnormal glucose: Secondary | ICD-10-CM

## 2017-12-01 DIAGNOSIS — Z79899 Other long term (current) drug therapy: Secondary | ICD-10-CM

## 2017-12-01 DIAGNOSIS — E039 Hypothyroidism, unspecified: Secondary | ICD-10-CM | POA: Diagnosis not present

## 2017-12-01 DIAGNOSIS — F325 Major depressive disorder, single episode, in full remission: Secondary | ICD-10-CM

## 2017-12-01 DIAGNOSIS — E782 Mixed hyperlipidemia: Secondary | ICD-10-CM | POA: Diagnosis not present

## 2017-12-01 DIAGNOSIS — E559 Vitamin D deficiency, unspecified: Secondary | ICD-10-CM

## 2017-12-01 DIAGNOSIS — E663 Overweight: Secondary | ICD-10-CM | POA: Diagnosis not present

## 2017-12-01 DIAGNOSIS — I10 Essential (primary) hypertension: Secondary | ICD-10-CM

## 2017-12-01 NOTE — Patient Instructions (Addendum)
Goals    . Weight (lb) < 150 lb (68 kg)       Know what a healthy weight is for you (roughly BMI <25) and aim to maintain this  Aim for 7+ servings of fruits and vegetables daily  65-80+ fluid ounces of water or unsweet tea for healthy kidneys  Limit to max 1 drink of alcohol per day; avoid smoking/tobacco  Limit animal fats in diet for cholesterol and heart health - choose grass fed whenever available  Avoid highly processed foods, and foods high in saturated/trans fats  Aim for low stress - take time to unwind and care for your mental health  Aim for 150 min of moderate intensity exercise weekly for heart health, and weights twice weekly for bone health  Aim for 7-9 hours of sleep daily     NEW GUIDELINES FOR BENOZOS  New guidelines suggest the benzodiazepines are best short term, with prolonged use they lead to physical and psychological dependence. In addition, evidence suggest that for insomnia the effectiveness wanes in 4 weeks and the risks out weight their benefits. Use of these agents have been associated with dementia, falls, motor vehicle accidents and physical addiction. Decreasing these medication have been proven to show improvements in cognition, alertness, decrease of falls and daytime sedation.   We recommend a slow taper, symptoms of withdrawal include, insomnia, anxiety, irritability, sweating and stomach or intestinal symptoms like diarrhea or nausea. Please let us know when you are ready to try to get off of this medication next year.

## 2017-12-02 LAB — CBC WITH DIFFERENTIAL/PLATELET
BASOS PCT: 0.7 %
Basophils Absolute: 48 cells/uL (ref 0–200)
EOS ABS: 152 {cells}/uL (ref 15–500)
EOS PCT: 2.2 %
HEMATOCRIT: 39.2 % (ref 35.0–45.0)
HEMOGLOBIN: 13.2 g/dL (ref 11.7–15.5)
LYMPHS ABS: 1891 {cells}/uL (ref 850–3900)
MCH: 29.7 pg (ref 27.0–33.0)
MCHC: 33.7 g/dL (ref 32.0–36.0)
MCV: 88.1 fL (ref 80.0–100.0)
MONOS PCT: 5.5 %
MPV: 11 fL (ref 7.5–12.5)
NEUTROS ABS: 4430 {cells}/uL (ref 1500–7800)
Neutrophils Relative %: 64.2 %
Platelets: 304 10*3/uL (ref 140–400)
RBC: 4.45 10*6/uL (ref 3.80–5.10)
RDW: 12.6 % (ref 11.0–15.0)
Total Lymphocyte: 27.4 %
WBC mixed population: 380 cells/uL (ref 200–950)
WBC: 6.9 10*3/uL (ref 3.8–10.8)

## 2017-12-02 LAB — LIPID PANEL
CHOLESTEROL: 174 mg/dL (ref <200)
HDL: 51 mg/dL (ref >50)
LDL Cholesterol (Calc): 98 mg/dL (calc)
Non-HDL Cholesterol (Calc): 123 mg/dL (calc) (ref <130)
Total CHOL/HDL Ratio: 3.4 (calc) (ref <5.0)
Triglycerides: 150 mg/dL — ABNORMAL HIGH (ref <150)

## 2017-12-02 LAB — COMPLETE METABOLIC PANEL WITH GFR
AG RATIO: 1.8 (calc) (ref 1.0–2.5)
ALKALINE PHOSPHATASE (APISO): 56 U/L (ref 33–130)
ALT: 17 U/L (ref 6–29)
AST: 23 U/L (ref 10–35)
Albumin: 4.6 g/dL (ref 3.6–5.1)
BILIRUBIN TOTAL: 0.3 mg/dL (ref 0.2–1.2)
BUN: 21 mg/dL (ref 7–25)
CHLORIDE: 102 mmol/L (ref 98–110)
CO2: 31 mmol/L (ref 20–32)
Calcium: 10 mg/dL (ref 8.6–10.4)
Creat: 0.66 mg/dL (ref 0.60–0.93)
GFR, EST AFRICAN AMERICAN: 102 mL/min/{1.73_m2} (ref >=60)
GFR, Est Non African American: 88 mL/min/{1.73_m2} (ref >=60)
Globulin: 2.5 g/dL (calc) (ref 1.9–3.7)
Glucose, Bld: 86 mg/dL (ref 65–99)
POTASSIUM: 4.2 mmol/L (ref 3.5–5.3)
Sodium: 143 mmol/L (ref 135–146)
TOTAL PROTEIN: 7.1 g/dL (ref 6.1–8.1)

## 2017-12-02 LAB — TSH: TSH: 4.45 m[IU]/L (ref 0.40–4.50)

## 2017-12-02 LAB — MAGNESIUM: MAGNESIUM: 2.3 mg/dL (ref 1.5–2.5)

## 2017-12-16 ENCOUNTER — Ambulatory Visit
Admission: RE | Admit: 2017-12-16 | Discharge: 2017-12-16 | Disposition: A | Discharge status: Home / Self Care | Payer: PPO | Source: Ambulatory Visit | Attending: Internal Medicine | Admitting: Internal Medicine

## 2017-12-16 ENCOUNTER — Other Ambulatory Visit: Payer: Self-pay | Admitting: Internal Medicine

## 2017-12-16 DIAGNOSIS — Z1231 Encounter for screening mammogram for malignant neoplasm of breast: Secondary | ICD-10-CM | POA: Diagnosis not present

## 2017-12-16 DIAGNOSIS — G4700 Insomnia, unspecified: Secondary | ICD-10-CM

## 2017-12-16 MED ORDER — ALPRAZOLAM 1 MG PO TABS: ORAL_TABLET | ORAL | 0 refills | Status: DC

## 2018-01-13 ENCOUNTER — Ambulatory Visit
Admission: RE | Admit: 2018-01-13 | Discharge: 2018-01-13 | Disposition: A | Discharge status: Home / Self Care | Payer: PPO | Source: Ambulatory Visit | Attending: Adult Health | Admitting: Adult Health

## 2018-01-13 DIAGNOSIS — Z13820 Encounter for screening for osteoporosis: Secondary | ICD-10-CM | POA: Diagnosis not present

## 2018-01-13 DIAGNOSIS — Z780 Asymptomatic menopausal state: Secondary | ICD-10-CM | POA: Diagnosis not present

## 2018-01-13 DIAGNOSIS — E2839 Other primary ovarian failure: Secondary | ICD-10-CM

## 2018-01-15 ENCOUNTER — Other Ambulatory Visit: Payer: Self-pay | Admitting: Internal Medicine

## 2018-01-15 DIAGNOSIS — G4700 Insomnia, unspecified: Secondary | ICD-10-CM

## 2018-01-24 ENCOUNTER — Other Ambulatory Visit: Payer: Self-pay | Admitting: Adult Health

## 2018-01-24 DIAGNOSIS — I10 Essential (primary) hypertension: Secondary | ICD-10-CM

## 2018-02-14 ENCOUNTER — Other Ambulatory Visit: Payer: Self-pay | Admitting: Adult Health

## 2018-02-14 DIAGNOSIS — G4700 Insomnia, unspecified: Secondary | ICD-10-CM

## 2018-02-15 ENCOUNTER — Other Ambulatory Visit: Payer: Self-pay | Admitting: Adult Health

## 2018-02-24 ENCOUNTER — Encounter: Payer: Self-pay | Admitting: Internal Medicine

## 2018-02-24 ENCOUNTER — Ambulatory Visit (INDEPENDENT_AMBULATORY_CARE_PROVIDER_SITE_OTHER): Payer: PPO | Admitting: Internal Medicine

## 2018-02-24 VITALS — BP 112/64 | HR 76 | Temp 97.2°F | Resp 16 | Ht 61.0 in | Wt 158.6 lb

## 2018-02-24 DIAGNOSIS — R300 Dysuria: Secondary | ICD-10-CM | POA: Diagnosis not present

## 2018-02-24 DIAGNOSIS — R7303 Prediabetes: Secondary | ICD-10-CM | POA: Diagnosis not present

## 2018-02-24 DIAGNOSIS — I10 Essential (primary) hypertension: Secondary | ICD-10-CM | POA: Diagnosis not present

## 2018-02-24 DIAGNOSIS — E039 Hypothyroidism, unspecified: Secondary | ICD-10-CM

## 2018-02-24 DIAGNOSIS — E559 Vitamin D deficiency, unspecified: Secondary | ICD-10-CM

## 2018-02-24 DIAGNOSIS — E782 Mixed hyperlipidemia: Secondary | ICD-10-CM

## 2018-02-24 DIAGNOSIS — R7309 Other abnormal glucose: Secondary | ICD-10-CM

## 2018-02-24 DIAGNOSIS — Z79899 Other long term (current) drug therapy: Secondary | ICD-10-CM | POA: Diagnosis not present

## 2018-02-24 MED ORDER — TRAZODONE HCL 150 MG PO TABS: ORAL_TABLET | ORAL | 1 refills | Status: DC

## 2018-02-24 NOTE — Patient Instructions (Signed)

## 2018-02-24 NOTE — Progress Notes (Signed)
This very nice 73 y.o. WWF presents for 6 month follow up with HTN, HLD, Pre-Diabetes and Vitamin D Deficiency.      Patient is treated for HTN (1995) & BP has been controlled at home. Today's BP is at goal -  112/64. Patient has had no complaints of any cardiac type chest pain, palpitations, dyspnea / orthopnea / PND, dizziness, claudication, or dependent edema.     Hyperlipidemia is controlled with diet & meds. Patient denies myalgias or other med SE's. Last Lipids were  Lab Results  Component Value Date   CHOL 174 12/01/2017   HDL 51 12/01/2017   LDLCALC 98 12/01/2017   TRIG 150 (H) 12/01/2017   CHOLHDL 3.4 12/01/2017      Also, the patient has history of PreDiabetes  (A1c 5.7% /2011)  and has had no symptoms of reactive hypoglycemia, diabetic polys, paresthesias or visual blurring.  Last A1c was at goal; Lab Results  Component Value Date   HGBA1C 5.5 08/11/2017      Further, the patient also has history of Vitamin D Deficiency    ("40"/ 2008)  and supplements vitamin D without any suspected side-effects. Last vitamin D was   Lab Results  Component Value Date   VD25OH 79 08/11/2017   Current Outpatient Medications on File Prior to Visit  Medication Sig  . ALPRAZolam (XANAX) 1 MG tablet TAKE 1/2 TO 1 TABLET BY MOUTH ONLY IF NEEDED AT HOUR OF SLEEP. LIMIT TO 5 DAYS A WEEK.  [name] aspirin 81 MG tablet Take 81 mg by mouth daily.  [name] atorvastatin (LIPITOR) 80 MG tablet TAKE 1/2 TO 1 TABLET DAILY OR AS DIRECTED FOR CHOLESTEROL  . Cholecalciferol (VITAMIN D PO) Take 2,000 Int'l Units by mouth 2 (two) times daily.   . cyclobenzaprine (FLEXERIL) 10 MG tablet Take 1/2 to 1 tablets 2 to 3 x / day if needed for Muscle Spasms or Headaches  . ezetimibe (ZETIA) 10 MG tablet TAKE 1 TABLET BY MOUTH EVERY DAY  . furosemide (LASIX) 40 MG tablet Use 1 tablet 2 x /day for BP & Fluid  . GINKGO BILOBA PLUS PO Take 60 mg by mouth daily.  [name] levothyroxine (SYNTHROID, LEVOTHROID) 75 MCG tablet TAKE 1  TABLET BY MOUTH MON-FRI AND 1/2 TABLET ON SAT & SUN  . OVER THE COUNTER MEDICATION Drinks Petra Kuba Made protein drink after exercise.  . ranitidine (ZANTAC) 300 MG tablet TAKE 1 TABLET IN THE EVENING  . vitamin C (ASCORBIC ACID) 500 MG tablet Take 1,000 mg by mouth daily.    No current facility-administered medications on file prior to visit.    Allergies  Allergen Reactions  . Aspirin     REACTION: GI ulcers  . Fish Oil     Allergic to fish oil and shell fish  . Morphine And Related   . Sulfa Antibiotics   . Vicodin [Hydrocodone-Acetaminophen] Nausea Only   PMHx:   Past Medical History:  Diagnosis Date  . Vitamin D deficiency    Immunization History  Administered Date(s) Administered  . Influenza, High Dose Seasonal PF 10/18/2014, 11/06/2015, 10/22/2017  . Influenza-Unspecified 10/13/2016  . Pneumococcal Conjugate-13 06/21/2014  . Pneumococcal Polysaccharide-23 01/21/2012  . Td 01/21/2012   Past Surgical History:  Procedure Laterality Date  . ABDOMINAL HYSTERECTOMY  1981   partial  . APPENDECTOMY    . BACK SURGERY    . CESAREAN SECTION    . SALPINGOOPHORECTOMY  1981 left   1982 right   FHx:  Reviewed / unchanged  SHx:    Reviewed / unchanged   Systems Review:  Constitutional: Denies fever, chills, wt changes, headaches, insomnia, fatigue, night sweats, change in appetite. Eyes: Denies redness, blurred vision, diplopia, discharge, itchy, watery eyes.  ENT: Denies discharge, congestion, post nasal drip, epistaxis, sore throat, earache, hearing loss, dental pain, tinnitus, vertigo, sinus pain, snoring.  CV: Denies chest pain, palpitations, irregular heartbeat, syncope, dyspnea, diaphoresis, orthopnea, PND, claudication or edema. Respiratory: denies cough, dyspnea, DOE, pleurisy, hoarseness, laryngitis, wheezing.  Gastrointestinal: Denies dysphagia, odynophagia, heartburn, reflux, water brash, abdominal pain or cramps, nausea, vomiting, bloating, diarrhea,  constipation, hematemesis, melena, hematochezia  or hemorrhoids. Genitourinary: Denies dysuria, frequency, urgency, nocturia, hesitancy, discharge, hematuria or flank pain. Musculoskeletal: Denies arthralgias, myalgias, stiffness, jt. swelling, pain, limping or strain/sprain.  Skin: Denies pruritus, rash, hives, warts, acne, eczema or change in skin lesion(s). Neuro: No weakness, tremor, incoordination, spasms, paresthesia or pain. Psychiatric: Denies confusion, memory loss or sensory loss. Endo: Denies change in weight, skin or hair change.  Heme/Lymph: No excessive bleeding, bruising or enlarged lymph nodes.  Physical Exam  BP 112/64   Pulse 76   Temp (!) 97.2 F (36.2 C)   Resp 16   Ht 5\' 1"  (1.549 m)   Wt 158 lb 9.6 oz (71.9 kg)   BMI 29.97 kg/m   Appears  well nourished, well groomed  and in no distress.  Eyes: PERRLA, EOMs, conjunctiva no swelling or erythema. Sinuses: No frontal/maxillary tenderness ENT/Mouth: EAC's clear, TM's nl w/o erythema, bulging. Nares clear w/o erythema, swelling, exudates. Oropharynx clear without erythema or exudates. Oral hygiene is good. Tongue normal, non obstructing. Hearing intact.  Neck: Supple. Thyroid not palpable. Car 2+/2+ without bruits, nodes or JVD. Chest: Respirations nl with BS clear & equal w/o rales, rhonchi, wheezing or stridor.  Cor: Heart sounds normal w/ regular rate and rhythm without sig. murmurs, gallops, clicks or rubs. Peripheral pulses normal and equal  without edema.  Abdomen: Soft & bowel sounds normal. Non-tender w/o guarding, rebound, hernias, masses or organomegaly.  Lymphatics: Unremarkable.  Musculoskeletal: Full ROM all peripheral extremities, joint stability, 5/5 strength and normal gait.  Skin: Warm, dry without exposed rashes, lesions or ecchymosis apparent.  Neuro: Cranial nerves intact, reflexes equal bilaterally. Sensory-motor testing grossly intact. Tendon reflexes grossly intact.  Pysch: Alert & oriented x  3.  Insight and judgement nl & appropriate. No ideations.  Assessment and Plan:  1. Essential hypertension  - Continue medication, monitor blood pressure at home.  - Continue DASH diet.  Reminder to go to the ER if any CP,  SOB, nausea, dizziness, severe HA, changes vision/speech.  - CBC with Differential/Platelet - COMPLETE METABOLIC PANEL WITH GFR - Magnesium - TSH  2. Hyperlipidemia, mixed  - Continue diet/meds, exercise,& lifestyle modifications.  - Continue monitor periodic cholesterol/liver & renal functions   - Lipid panel - TSH  3. Abnormal glucose  - Continue diet, exercise  - Lifestyle modifications.  - Monitor appropriate labs.  - Hemoglobin A1c - Insulin, random  4. Vitamin D deficiency  - Continue supplementation.  - VITAMIN D 25 Hydroxyl  5. Hypothyroidism  - TSH  6. Prediabetes  - Hemoglobin A1c - Insulin, random  7. Medication management  - CBC with Differential/Platelet - COMPLETE METABOLIC PANEL WITH GFR - Magnesium - Lipid panel - TSH - Hemoglobin A1c - Insulin, random - VITAMIN D 25 Hydroxyl  8. Dysuria  - Urinalysis, Routine w reflex microscopic - Urine Culture  Discussed  regular exercise, BP monitoring, weight control to achieve/maintain BMI less than 25 and discussed med and SE's. Recommended labs to assess and monitor clinical status with further disposition pending results of labs. Over 30 minutes of exam, counseling, chart review was performed.

## 2018-02-25 ENCOUNTER — Other Ambulatory Visit: Payer: Self-pay | Admitting: Internal Medicine

## 2018-02-25 DIAGNOSIS — N390 Urinary tract infection, site not specified: Secondary | ICD-10-CM

## 2018-02-25 LAB — COMPLETE METABOLIC PANEL WITH GFR
AG Ratio: 2.1 (calc) (ref 1.0–2.5)
ALT: 17 U/L (ref 6–29)
AST: 25 U/L (ref 10–35)
Albumin: 4.7 g/dL (ref 3.6–5.1)
Alkaline phosphatase (APISO): 62 U/L (ref 37–153)
BUN: 20 mg/dL (ref 7–25)
CALCIUM: 10.1 mg/dL (ref 8.6–10.4)
CO2: 30 mmol/L (ref 20–32)
Chloride: 103 mmol/L (ref 98–110)
Creat: 0.93 mg/dL (ref 0.60–0.93)
GFR, Est African American: 71 mL/min/{1.73_m2} (ref >=60)
GFR, Est Non African American: 61 mL/min/{1.73_m2} (ref >=60)
Globulin: 2.2 g/dL (calc) (ref 1.9–3.7)
Glucose, Bld: 88 mg/dL (ref 65–99)
Potassium: 5.1 mmol/L (ref 3.5–5.3)
Sodium: 142 mmol/L (ref 135–146)
Total Bilirubin: 0.4 mg/dL (ref 0.2–1.2)
Total Protein: 6.9 g/dL (ref 6.1–8.1)

## 2018-02-25 LAB — LIPID PANEL
Cholesterol: 181 mg/dL (ref <200)
HDL: 43 mg/dL — ABNORMAL LOW (ref >=50)
LDL Cholesterol (Calc): 98 mg/dL (calc)
Non-HDL Cholesterol (Calc): 138 mg/dL (calc) — ABNORMAL HIGH (ref <130)
Total CHOL/HDL Ratio: 4.2 (calc) (ref <5.0)
Triglycerides: 301 mg/dL — ABNORMAL HIGH (ref <150)

## 2018-02-25 LAB — CBC WITH DIFFERENTIAL/PLATELET
Absolute Monocytes: 428 cells/uL (ref 200–950)
BASOS ABS: 62 {cells}/uL (ref 0–200)
Basophils Relative: 0.9 %
EOS ABS: 117 {cells}/uL (ref 15–500)
Eosinophils Relative: 1.7 %
HCT: 38.5 % (ref 35.0–45.0)
HEMOGLOBIN: 13.5 g/dL (ref 11.7–15.5)
Lymphs Abs: 1339 cells/uL (ref 850–3900)
MCH: 31.2 pg (ref 27.0–33.0)
MCHC: 35.1 g/dL (ref 32.0–36.0)
MCV: 88.9 fL (ref 80.0–100.0)
MONOS PCT: 6.2 %
MPV: 11.2 fL (ref 7.5–12.5)
Neutro Abs: 4954 cells/uL (ref 1500–7800)
Neutrophils Relative %: 71.8 %
Platelets: 321 10*3/uL (ref 140–400)
RBC: 4.33 10*6/uL (ref 3.80–5.10)
RDW: 12.1 % (ref 11.0–15.0)
Total Lymphocyte: 19.4 %
WBC: 6.9 10*3/uL (ref 3.8–10.8)

## 2018-02-25 LAB — HEMOGLOBIN A1C
Hgb A1c MFr Bld: 5.2 % of total Hgb (ref <5.7)
Mean Plasma Glucose: 103 (calc)
eAG (mmol/L): 5.7 (calc)

## 2018-02-25 LAB — MAGNESIUM: Magnesium: 2.2 mg/dL (ref 1.5–2.5)

## 2018-02-25 LAB — VITAMIN D 25 HYDROXY (VIT D DEFICIENCY, FRACTURES): Vit D, 25-Hydroxy: 74 ng/mL (ref 30–100)

## 2018-02-25 LAB — TSH: TSH: 1.77 mIU/L (ref 0.40–4.50)

## 2018-02-25 LAB — INSULIN, RANDOM: Insulin: 10.9 u[IU]/mL

## 2018-02-26 ENCOUNTER — Other Ambulatory Visit: Payer: Self-pay | Admitting: Internal Medicine

## 2018-02-26 ENCOUNTER — Other Ambulatory Visit: Payer: Self-pay

## 2018-02-26 LAB — URINALYSIS, ROUTINE W REFLEX MICROSCOPIC
Bacteria, UA: NONE SEEN /HPF
Bilirubin Urine: NEGATIVE
Glucose, UA: NEGATIVE
Hgb urine dipstick: NEGATIVE
Hyaline Cast: NONE SEEN /LPF
Ketones, ur: NEGATIVE
Nitrite: NEGATIVE
Protein, ur: NEGATIVE
RBC / HPF: NONE SEEN /HPF (ref 0–2)
SPECIFIC GRAVITY, URINE: 1.013 (ref 1.001–1.03)
Squamous Epithelial / HPF: NONE SEEN /HPF (ref <=5)
pH: 6.5 (ref 5.0–8.0)

## 2018-02-26 LAB — URINE CULTURE
MICRO NUMBER:: 217228
SPECIMEN QUALITY:: ADEQUATE

## 2018-02-27 ENCOUNTER — Other Ambulatory Visit: Payer: Self-pay | Admitting: Internal Medicine

## 2018-02-27 DIAGNOSIS — N390 Urinary tract infection, site not specified: Secondary | ICD-10-CM

## 2018-02-27 DIAGNOSIS — R300 Dysuria: Secondary | ICD-10-CM

## 2018-02-27 DIAGNOSIS — N3000 Acute cystitis without hematuria: Secondary | ICD-10-CM

## 2018-02-27 MED ORDER — CIPROFLOXACIN HCL 250 MG PO TABS: ORAL_TABLET | ORAL | 0 refills | Status: DC

## 2018-03-13 ENCOUNTER — Other Ambulatory Visit: Payer: Self-pay | Admitting: Internal Medicine

## 2018-03-15 ENCOUNTER — Telehealth: Payer: Self-pay | Admitting: *Deleted

## 2018-03-15 NOTE — Telephone Encounter (Signed)
Patient called and states she took Trazodone 150 mg last night and felt nauseous, light headed and heart racing. She complained of an extremely dry mouth today.  Per Dr Melford Aase, the patient should try 1/3 of a tablet tonight and she will call back if she has a problem with the medication.

## 2018-03-17 ENCOUNTER — Other Ambulatory Visit: Payer: Self-pay | Admitting: *Deleted

## 2018-03-17 ENCOUNTER — Other Ambulatory Visit: Payer: Self-pay | Admitting: Internal Medicine

## 2018-03-17 ENCOUNTER — Telehealth: Payer: Self-pay | Admitting: *Deleted

## 2018-03-17 MED ORDER — PREDNISONE 20 MG PO TABS: ORAL_TABLET | ORAL | 0 refills | Status: DC

## 2018-03-17 MED ORDER — AZITHROMYCIN 250 MG PO TABS: ORAL_TABLET | ORAL | 1 refills | Status: DC

## 2018-03-17 NOTE — Telephone Encounter (Signed)
Patient called and reported she is continuing to try the Trazodone for sleep.  She increase her dose from 1/3 tablet to 1/ tablet 1 hour prior to bedtime and was able to go to slepp, but restless sleep.  She states she has sinus pressure and requested an RX.  RX's for a Z-pak and Prednisone taper were sent to her pharmacy by Dr Melford Aase.[name]

## 2018-03-29 ENCOUNTER — Telehealth: Payer: Self-pay | Admitting: *Deleted

## 2018-03-29 NOTE — Telephone Encounter (Signed)
Patient called and asked if OK to take Zyrtec OTC for her sinus drainage.  Per Dr Melford Aase, it is OK.Patient is aware.

## 2018-03-30 ENCOUNTER — Telehealth: Payer: Self-pay | Admitting: *Deleted

## 2018-03-30 NOTE — Telephone Encounter (Signed)
Patient requested to cancel NV to recheck her urine, due to a lock down at her senior apartment complex.  Dr Melford Aase is aware and the appointment has been cancelled.

## 2018-03-31 ENCOUNTER — Ambulatory Visit: Payer: Self-pay

## 2018-04-27 ENCOUNTER — Ambulatory Visit: Payer: Self-pay | Admitting: Adult Health

## 2018-05-01 ENCOUNTER — Other Ambulatory Visit: Payer: Self-pay | Admitting: Internal Medicine

## 2018-05-01 MED ORDER — PREDNISONE 20 MG PO TABS: ORAL_TABLET | ORAL | 0 refills | Status: DC

## 2018-05-04 ENCOUNTER — Other Ambulatory Visit: Payer: Self-pay | Admitting: Adult Health

## 2018-05-24 NOTE — Progress Notes (Addendum)
MEDICARE ANNUAL WELLNESS VISIT AND 15MONTH FOLLOW UP  I connected with  [name] on 05/25/18 by a televideo and verified that I am speaking with the correct person using two identifiers.   I discussed the limitations of evaluation and management by televideo. The patient expressed understanding and agreed to proceed.    Assessment:   [name] was seen today for follow-up and medicare wellness.  Diagnoses and all orders for this visit:  Medicare annual wellness visit, subsequent Yearly  Essential hypertension Has lasix PRN Denies any edema Continue to monitor blood pressure  Hyperlipidemia, mixed Continue zetia Has tried Crestor, increased myalgias Will check lipids next office visit  Hypothyroidism, unspecified type Taking levothyroxine 27mcg daily  Hypothyroidism continue medications the same pending lab results reminded to take on an empty stomach 30-82mins before food.  check TSH level next office visit Asymptomatic  Vitamin D deficiency Continue supplementation  Abnormal glucose Discussed dietary and exercise modifications  Insomnia, unspecified type Discussed melatonin nightly Decrease stimulation, screen time Exercise regularly  BMI 29.0-29.9,adult Discussed dietary and exercise modifications  Medication management continued   Over 30 minutes of non face to face interview, counseling, chart review and critical decision making was performed Future Appointments  Date Time Provider [place]  08/31/2018  2:00 PM Unk Pinto, MD [facility] None  06/14/2019  2:00 PM Garnet Sierras, NP [facility] None     Plan:   During the course of the visit the patient was educated and counseled about appropriate screening and preventive services including:    Pneumococcal vaccine   Prevnar 13  Influenza vaccine  Td vaccine  Screening electrocardiogram  Bone densitometry screening  Colorectal cancer screening  Diabetes  screening  Glaucoma screening  Nutrition counseling   Advanced directives: requested   Subjective:  [name] is a 73 y.o. female who presents for Medicare Annual Wellness Visit and 3 month follow up for HTN, hyperlipidemia, hypothyroidism, insomnia, weight and Vitamin D Deficiency.    [name] Her blood pressure has been controlled she has not checked her blood pressure since going to her daughters house in [place].   She does workout. She denies chest pain, shortness of breath, dizziness.  She is on cholesterol medication and denies myalgias. Her cholesterol is not at goal. The cholesterol last visit was:   Lab Results  Component Value Date   CHOL 181 02/24/2018   HDL 43 (L) 02/24/2018   LDLCALC 98 02/24/2018   TRIG 301 (H) 02/24/2018   CHOLHDL 4.2 02/24/2018   . She has been working on diet and exercise, we monitor for prediabetes, and denies increased appetite, nausea, paresthesia of the feet, polydipsia, polyuria, visual disturbances, vomiting and weight loss. Last A1C in the office was:  Lab Results  Component Value Date   HGBA1C 5.2 02/24/2018   Last GFR:   Lab Results  Component Value Date   GFRNONAA 61 02/24/2018   Lab Results  Component Value Date   GFRAA 71 02/24/2018   Patient is on Vitamin D supplement.   Lab Results  Component Value Date   VD25OH 74 02/24/2018      Medication Review: Current Outpatient Medications on File Prior to Visit  Medication Sig Dispense Refill  . aspirin 81 MG tablet Take 81 mg by mouth daily.    . Cholecalciferol (VITAMIN D PO) Take 2,000 Int'l Units by mouth 2 (two) times daily.     . cyclobenzaprine (FLEXERIL) 10 MG tablet Take 1/2 to 1 tablets 2 to 3 x /  day if needed for Muscle Spasms or Headaches 90 tablet 3  . ezetimibe (ZETIA) 10 MG tablet TAKE 1 TABLET BY MOUTH EVERY DAY 90 tablet 1  . furosemide (LASIX) 40 MG tablet Use 1 tablet 2 x /day for BP & Fluid 180 tablet 3  . GINKGO BILOBA PLUS PO Take 60 mg by mouth daily.     [name] levothyroxine (SYNTHROID) 75 MCG tablet Take 1 tablet 5 x/week Mon-Fri & 1/2 tab on Sat/Sun. Take on an empty stomach with only water for 30 minutes & no Antacid meds, Calcium or Magnesium for 4 hours & avoid Biotin 90 tablet 1  . Melatonin 3 MG CAPS Take by mouth at bedtime.    . vitamin C (ASCORBIC ACID) 500 MG tablet Take 1,000 mg by mouth daily.      No current facility-administered medications on file prior to visit.     Allergies  Allergen Reactions  . Aspirin     REACTION: GI ulcers  . Fish Oil     Allergic to fish oil and shell fish  . Morphine And Related   . Sulfa Antibiotics   . Vicodin [Hydrocodone-Acetaminophen] Nausea Only    Current Problems (verified) Patient Active Problem List   Diagnosis Date Noted  . Insomnia 05/29/2018  . Hypothyroid 04/13/2017  . Benign paroxysmal positional vertigo 02/07/2016  . BMI 29.0-29.9,adult 09/21/2014  . Encounter for Medicare annual wellness exam 09/21/2014  . Medication management 08/15/2013  . Abnormal glucose 01/26/2013  . Vitamin D deficiency 12/13/2012  . GERD   . Hyperlipidemia, mixed   . Depression, major, in remission ([place])   . Essential hypertension 09/09/2008    Screening Tests Immunization History  Administered Date(s) Administered  . Influenza, High Dose Seasonal PF 10/18/2014, 11/06/2015, 10/22/2017  . Influenza-Unspecified 10/13/2016  . Pneumococcal Conjugate-13 06/21/2014  . Pneumococcal Polysaccharide-23 01/21/2012  . Td 01/21/2012    Preventative care: Last colonoscopy: 2010, Due Last mammogram: 2019, Due 12/2018 DEXA: 2020  Prior vaccinations: TD or Tdap: 2014  Influenza: 2019, due for 2020  Pneumococcal: 2014 Prevnar13: 2016 Shingles/Zostavax: Discussed with patient, Shingrix.    Names of Other Physician/Practitioners you currently use: 1. [facility] here for primary care 2. Eye Exam 2019 3. Dental Exam , Due  Patient Care Team: Unk Pinto, MD as PCP - General (Internal Medicine) Lafayette Dragon, MD (Inactive) as Consulting Physician (Gastroenterology)  SURGICAL HISTORY She  has a past surgical history that includes Back surgery; Appendectomy; Cesarean section; Abdominal hysterectomy (1981); and Salpingoophorectomy (1981 left). FAMILY HISTORY Her family history includes Diabetes in her mother; Heart disease in her father, mother, and sister; Hypertension in her father; Kidney disease in her mother. SOCIAL HISTORY She  reports that she quit smoking about 10 years ago. She has never used smokeless tobacco. She reports that she does not drink alcohol or use drugs.   MEDICARE WELLNESS OBJECTIVES: Physical activity: Current Exercise Habits: Home exercise routine, Type of exercise: treadmill, Time (Minutes): 30, Frequency (Times/Week): 4, Weekly Exercise (Minutes/Week): 120, Intensity: Mild, Exercise limited by: None identified Cardiac risk factors: Cardiac Risk Factors include: dyslipidemia Depression/mood screen:   Depression screen [facility] 2/9 05/25/2018  Decreased Interest 0  Down, Depressed, Hopeless 0  PHQ - 2 Score 0  Altered sleeping 2  Tired, decreased energy 0  Change in appetite 0  Feeling bad or failure about yourself  0  Trouble concentrating 0  Moving slowly or fidgety/restless 0  PHQ-9 Score 2  Difficult doing  work/chores Not difficult at all    ADLs:  In your present state of health, do you have any difficulty performing the following activities: 05/25/2018 02/24/2018  Hearing? N N  Vision? N N  Difficulty concentrating or making decisions? N N  Walking or climbing stairs? N N  Dressing or bathing? N N  Doing errands, shopping? N N  Preparing Food and eating ? N -  Using the Toilet? N -  In the past six months, have you accidently leaked urine? Y -  Comment Urge -  Do you have problems with loss of bowel control? N -  Managing your Medications? N -  Managing your Finances? N -  Housekeeping or  managing your Housekeeping? N -  Some recent data might be hidden     Cognitive Testing  Alert? Yes  Normal Appearance?Yes  Oriented to person? Yes  Place? Yes   Time? Yes  Recall of three objects?  Yes  Can perform simple calculations? Yes  Displays appropriate judgment?Yes  Can read the correct time from a watch face?Yes  EOL planning: Does Patient Have a Medical Advance Directive?: No Type of Advance Directive: Healthcare Power of Attorney, Living will Does patient want to make changes to medical advance directive?: Yes (Inpatient - patient defers changing a medical advance directive at this time - Information given) Would patient like information on creating a medical advance directive?: Yes (Inpatient - patient defers creating a medical advance directive at this time - Information given), No - Patient declined  Review of Systems  Constitutional: Negative for chills, diaphoresis, fever, malaise/fatigue and weight loss.  HENT: Negative for congestion, ear discharge, ear pain, hearing loss, nosebleeds, sinus pain, sore throat and tinnitus.   Eyes: Negative for blurred vision, double vision, photophobia, pain, discharge and redness.  Respiratory: Negative for cough, hemoptysis, sputum production, shortness of breath, wheezing and stridor.   Cardiovascular: Negative for chest pain, palpitations, orthopnea, claudication, leg swelling and PND.  Gastrointestinal: Negative for abdominal pain, blood in stool, constipation, diarrhea, heartburn, melena, nausea and vomiting.  Genitourinary: Negative for dysuria, flank pain, frequency, hematuria and urgency.  Musculoskeletal: Negative for back pain, falls, joint pain, myalgias and neck pain.  Skin: Negative for itching and rash.  Neurological: Negative for dizziness, tingling, tremors, sensory change, speech change, focal weakness, seizures, loss of consciousness, weakness and headaches.  Endo/Heme/Allergies: Negative for environmental allergies  and polydipsia. Does not bruise/bleed easily.  Psychiatric/Behavioral: Negative for depression, hallucinations, memory loss, substance abuse and suicidal ideas. The patient is not nervous/anxious and does not have insomnia.      Objective:     Today's Vitals   05/25/18 1430  Weight: 158 lb (71.7 kg)  Height: 5\' 1"  (1.549 m)  PainSc: 0-No pain    General : Well sounding patient in no apparent distress HEENT: no hoarseness, no cough for duration of visit Lungs: speaks in complete sentences, no audible wheezing, no apparent distress Neurological: alert, oriented x 3 Psychiatric: pleasant, judgement appropriate    Medicare Attestation I have personally reviewed: The patient's medical and social history Their use of alcohol, tobacco or illicit drugs Their current medications and supplements The patient's functional ability including ADLs,fall risks, home safety risks, cognitive, and hearing and visual impairment Diet and physical activities Evidence for depression or mood disorders  The patient's weight, height, BMI, and visual acuity have been recorded in the chart.  I have made referrals, counseling, and provided education to the patient based on review of the above and  I have provided the patient with a written personalized care plan for preventive services.     Garnet Sierras, NP [facility] 05/25/2018  2:36 PM

## 2018-05-25 ENCOUNTER — Other Ambulatory Visit: Payer: Self-pay

## 2018-05-25 ENCOUNTER — Ambulatory Visit: Payer: PPO | Admitting: Adult Health Nurse Practitioner

## 2018-05-25 ENCOUNTER — Encounter: Payer: Self-pay | Admitting: Adult Health Nurse Practitioner

## 2018-05-25 ENCOUNTER — Ambulatory Visit: Payer: Self-pay | Admitting: Adult Health

## 2018-05-25 VITALS — Ht 61.0 in | Wt 158.0 lb

## 2018-05-25 DIAGNOSIS — Z79899 Other long term (current) drug therapy: Secondary | ICD-10-CM | POA: Diagnosis not present

## 2018-05-25 DIAGNOSIS — E039 Hypothyroidism, unspecified: Secondary | ICD-10-CM

## 2018-05-25 DIAGNOSIS — G4700 Insomnia, unspecified: Secondary | ICD-10-CM | POA: Diagnosis not present

## 2018-05-25 DIAGNOSIS — E782 Mixed hyperlipidemia: Secondary | ICD-10-CM | POA: Diagnosis not present

## 2018-05-25 DIAGNOSIS — Z0000 Encounter for general adult medical examination without abnormal findings: Secondary | ICD-10-CM

## 2018-05-25 DIAGNOSIS — E559 Vitamin D deficiency, unspecified: Secondary | ICD-10-CM

## 2018-05-25 DIAGNOSIS — R7309 Other abnormal glucose: Secondary | ICD-10-CM

## 2018-05-25 DIAGNOSIS — Z0001 Encounter for general adult medical examination with abnormal findings: Secondary | ICD-10-CM | POA: Diagnosis not present

## 2018-05-25 DIAGNOSIS — I10 Essential (primary) hypertension: Secondary | ICD-10-CM

## 2018-05-25 DIAGNOSIS — R6889 Other general symptoms and signs: Secondary | ICD-10-CM | POA: Diagnosis not present

## 2018-05-25 DIAGNOSIS — Z6829 Body mass index (BMI) 29.0-29.9, adult: Secondary | ICD-10-CM | POA: Diagnosis not present

## 2018-05-29 DIAGNOSIS — G4700 Insomnia, unspecified: Secondary | ICD-10-CM | POA: Insufficient documentation

## 2018-05-29 NOTE — Patient Instructions (Addendum)
[name] , Thank you for taking time to come for your Medicare Wellness Visit. I appreciate your ongoing commitment to your health goals. Please review the following plan we discussed and let me know if I can assist you in the future.   These are the goals we discussed: Goals    . Weight (lb) < 150 lb (68 kg)      You are due for a mammogram and colonoscopy once COVID-19 restrictions are lifted. Due for yearly dental exam as well when appropriate.  When you return from [place], contact us for an appointment.  This is a list of the screening recommended for you and due dates:  Health Maintenance  Topic Date Due  . Colon Cancer Screening  07/07/2018  . Flu Shot  08/07/2018  . Mammogram  12/17/2018  . Tetanus Vaccine  01/20/2022  . DEXA scan (bone density measurement)  Completed  .  Hepatitis C: One time screening is recommended by Center for Disease Control  (CDC) for  adults born from [birth year] through 1966.   Completed  . Pneumonia vaccines  Completed   Increase the amount of melatonin 3mg  tablet, take two.  You can try increasing this, max dose should be 15mg .  Also here is some information about good sleep hygiene.   Insomnia Insomnia is frequent trouble falling and/or staying asleep. Insomnia can be a long term problem or a short term problem. Both are common. Insomnia can be a short term problem when the wakefulness is related to a certain stress or worry. Long term insomnia is often related to ongoing stress during waking hours and/or poor sleeping habits. Overtime, sleep deprivation itself can make the problem worse. Every little thing feels more severe because you are overtired and your ability to cope is decreased. CAUSES   Stress, anxiety, and depression.  Poor sleeping habits.  Distractions such as TV in the bedroom.  Naps close to bedtime.  Engaging in emotionally charged conversations before bed.  Technical reading before sleep.  Alcohol and other  sedatives. They may make the problem worse. They can hurt normal sleep patterns and normal dream activity.  Stimulants such as caffeine for several hours prior to bedtime.  Pain syndromes and shortness of breath can cause insomnia.  Exercise late at night.  Changing time zones may cause sleeping problems (jet lag). It is sometimes helpful to have someone observe your sleeping patterns. They should look for periods of not breathing during the night (sleep apnea). They should also look to see how long those periods last. If you live alone or observers are uncertain, you can also be observed at a sleep clinic where your sleep patterns will be professionally monitored. Sleep apnea requires a checkup and treatment. Give your caregivers your medical history. Give your caregivers observations your family has made about your sleep.  SYMPTOMS   Not feeling rested in the morning.  Anxiety and restlessness at bedtime.  Difficulty falling and staying asleep. TREATMENT   Your caregiver may prescribe treatment for an underlying medical disorders. Your caregiver can give advice or help if you are using alcohol or other drugs for self-medication. Treatment of underlying problems will usually eliminate insomnia problems.  Medications can be prescribed for short time use. They are generally not recommended for lengthy use.  Over-the-counter sleep medicines are not recommended for lengthy use. They can be habit forming.  You can promote easier sleeping by making lifestyle changes such as:  Using relaxation techniques that help with breathing  and reduce muscle tension.  Exercising earlier in the day.  Changing your diet and the time of your last meal. No night time snacks.  Establish a regular time to go to bed.  Counseling can help with stressful problems and worry.  Soothing music and white noise may be helpful if there are background noises you cannot remove.  Stop tedious detailed work at  least one hour before bedtime. HOME CARE INSTRUCTIONS   Keep a diary. Inform your caregiver about your progress. This includes any medication side effects. See your caregiver regularly. Take note of:  Times when you are asleep.  Times when you are awake during the night.  The quality of your sleep.  How you feel the next day. This information will help your caregiver care for you.  Get out of bed if you are still awake after 15 minutes. Read or do some quiet activity. Keep the lights down. Wait until you feel sleepy and go back to bed.  Keep regular sleeping and waking hours. Avoid naps.  Exercise regularly.  Avoid distractions at bedtime. Distractions include watching television or engaging in any intense or detailed activity like attempting to balance the household checkbook.  Develop a bedtime ritual. Keep a familiar routine of bathing, brushing your teeth, climbing into bed at the same time each night, listening to soothing music. Routines increase the success of falling to sleep faster.  Use relaxation techniques. This can be using breathing and muscle tension release routines. It can also include visualizing peaceful scenes. You can also help control troubling or intruding thoughts by keeping your mind occupied with boring or repetitive thoughts like the old concept of counting sheep. You can make it more creative like imagining planting one beautiful flower after another in your backyard garden.  During your day, work to eliminate stress. When this is not possible use some of the previous suggestions to help reduce the anxiety that accompanies stressful situations. MAKE SURE YOU:   Understand these instructions.  Will watch your condition.  Will get help right away if you are not doing well or get worse. Document Released: 12/21/1999 Document Revised: 03/17/2011 Document Reviewed: 01/20/2007 [facility] Patient Information 2015 [place], [place]. This information is not intended  to replace advice given to you by your health care provider. Make sure you discuss any questions you have with your health care provider.

## 2018-07-29 ENCOUNTER — Encounter: Payer: Self-pay | Admitting: Gastroenterology

## 2018-08-11 ENCOUNTER — Encounter: Payer: Self-pay | Admitting: Internal Medicine

## 2018-08-30 ENCOUNTER — Telehealth: Payer: Self-pay | Admitting: Internal Medicine

## 2018-08-30 NOTE — Telephone Encounter (Signed)
patient called to advise she is living in [place] with daughter during pandemic. will call to advise where to send records for local care. Dr Melford Aase aware.

## 2018-08-31 ENCOUNTER — Encounter: Payer: Self-pay | Admitting: Internal Medicine

## 2018-10-22 ENCOUNTER — Other Ambulatory Visit: Payer: Self-pay | Admitting: Internal Medicine

## 2018-11-02 DIAGNOSIS — Z23 Encounter for immunization: Secondary | ICD-10-CM | POA: Diagnosis not present

## 2018-11-02 DIAGNOSIS — E785 Hyperlipidemia, unspecified: Secondary | ICD-10-CM | POA: Diagnosis not present

## 2018-11-02 DIAGNOSIS — I10 Essential (primary) hypertension: Secondary | ICD-10-CM | POA: Diagnosis not present

## 2018-11-02 DIAGNOSIS — Z0000 Encounter for general adult medical examination without abnormal findings: Secondary | ICD-10-CM | POA: Diagnosis not present

## 2018-11-02 DIAGNOSIS — E039 Hypothyroidism, unspecified: Secondary | ICD-10-CM | POA: Diagnosis not present

## 2018-11-02 DIAGNOSIS — G2581 Restless legs syndrome: Secondary | ICD-10-CM | POA: Diagnosis not present

## 2018-11-13 DIAGNOSIS — E785 Hyperlipidemia, unspecified: Secondary | ICD-10-CM | POA: Diagnosis not present

## 2018-11-13 DIAGNOSIS — E039 Hypothyroidism, unspecified: Secondary | ICD-10-CM | POA: Diagnosis not present

## 2018-12-11 DIAGNOSIS — Z1159 Encounter for screening for other viral diseases: Secondary | ICD-10-CM | POA: Diagnosis not present

## 2018-12-13 DIAGNOSIS — Z1211 Encounter for screening for malignant neoplasm of colon: Secondary | ICD-10-CM | POA: Diagnosis not present

## 2018-12-13 DIAGNOSIS — D125 Benign neoplasm of sigmoid colon: Secondary | ICD-10-CM | POA: Diagnosis not present

## 2018-12-13 DIAGNOSIS — K635 Polyp of colon: Secondary | ICD-10-CM | POA: Diagnosis not present

## 2018-12-13 DIAGNOSIS — T7840XA Allergy, unspecified, initial encounter: Secondary | ICD-10-CM | POA: Diagnosis not present

## 2018-12-13 DIAGNOSIS — K621 Rectal polyp: Secondary | ICD-10-CM | POA: Diagnosis not present

## 2018-12-20 DIAGNOSIS — Z1231 Encounter for screening mammogram for malignant neoplasm of breast: Secondary | ICD-10-CM | POA: Diagnosis not present

## 2019-01-24 DIAGNOSIS — E785 Hyperlipidemia, unspecified: Secondary | ICD-10-CM | POA: Diagnosis not present

## 2019-01-24 DIAGNOSIS — E039 Hypothyroidism, unspecified: Secondary | ICD-10-CM | POA: Diagnosis not present

## 2019-05-02 ENCOUNTER — Other Ambulatory Visit: Payer: Self-pay

## 2019-05-02 DIAGNOSIS — I10 Essential (primary) hypertension: Secondary | ICD-10-CM

## 2019-05-02 MED ORDER — FUROSEMIDE 40 MG PO TABS: ORAL_TABLET | ORAL | 3 refills | Status: DC

## 2019-05-05 ENCOUNTER — Other Ambulatory Visit: Payer: Self-pay | Admitting: Internal Medicine

## 2019-06-14 ENCOUNTER — Ambulatory Visit: Payer: PPO | Admitting: Adult Health Nurse Practitioner

## 2019-08-02 ENCOUNTER — Other Ambulatory Visit: Payer: Self-pay

## 2019-08-05 ENCOUNTER — Other Ambulatory Visit: Payer: Self-pay | Admitting: Adult Health

## 2019-09-28 ENCOUNTER — Encounter: Payer: PPO | Admitting: Internal Medicine

## 2019-10-22 DIAGNOSIS — H6123 Impacted cerumen, bilateral: Secondary | ICD-10-CM | POA: Diagnosis not present

## 2019-10-27 ENCOUNTER — Other Ambulatory Visit: Payer: Self-pay | Admitting: Internal Medicine

## 2019-11-01 ENCOUNTER — Other Ambulatory Visit: Payer: Self-pay | Admitting: Internal Medicine

## 2019-11-06 NOTE — Progress Notes (Signed)
History of Present Illness:       This very nice 74 y.o. [place] presents for  Long Overdue Follow Up with HTN, HLD, Pre-Diabetes and Vitamin D Deficiency.  Patient apparently has moved to [place] to reside with a daughter & alternates living  q43mo with a son living locally.        Patients relates that she had a Colonoscopy  Last year (2020) in [place] and the found 1 big & 3 small polyps and  "no follow-up was recommended". She was asked to please check her correspondence from the GI  Dr's office to confirm follow-up.        Patient is treated for HTN  (1995) & BP has been controlled at home. Today's BP is at goal - 124/82. Patient has had no complaints of any cardiac type chest pain, palpitations, dyspnea / orthopnea / PND, dizziness, claudication, or dependent edema.       Hyperlipidemia is controlled with diet & meds. Patient denies myalgias or other med SE's. Last Lipids were at goal except elevated Trig's:  Lab Results  Component Value Date   CHOL 181 02/24/2018   HDL 43 (L) 02/24/2018   LDLCALC 98 02/24/2018   TRIG 301 (H) 02/24/2018   CHOLHDL 4.2 02/24/2018    Also, the patient has history of PreDiabetes (A1c 5.7% /2011) and has had no symptoms of reactive hypoglycemia, diabetic polys, paresthesias or visual blurring.  Last A1c was Normal & at goal:  Lab Results  Component Value Date   HGBA1C 5.2 02/24/2018        Patient was dx'd Hypothyroid in 2009 & has been on replacement since.       Further, the patient also has history of Vitamin D Deficiency("40"/2008)and supplements vitamin D without any suspected side-effects. Last vitamin D was at goal:  Lab Results  Component Value Date   VD25OH 74 02/24/2018    Current Outpatient Medications on File Prior to Visit  Medication Sig  . aspirin 81 MG tablet Take daily.  . Cholecalciferol (VITAMIN D PO) Take 2,000 U 2  times daily.   . cyclobenzaprine (FLEXERIL) 10 MG tablet Take 1/2 to 1 tablets 2 to 3  x / day if needed  . ezetimibe (ZETIA) 10 MG tablet Take 1 tablet   Daily   for Cholesterol   . furosemide (LASIX) 40 MG tablet Use 1 tablet 2 x /day for BP & Fluid  . GINKGO BILOBA PLUS PO Take 6 daily.  [name] levothyroxine (SYNTHROID) 75 MCG tablet TAKE 1 TABLET DAILY   . Melatonin 3 MG CAPS Take  at bedtime.  . vitamin C (ASCORBIC ACID) 500 MG tablet Take 1,000 mg daily.      Allergies  Allergen Reactions  . Aspirin     REACTION: GI ulcers  . Fish Oil     Allergic to fish oil and shell fish  . Morphine And Related   . Sulfa Antibiotics   . Vicodin [Hydrocodone-Acetaminophen] Nausea Only    PMHx:   Past Medical History:  Diagnosis Date  . Vitamin D deficiency     Immunization History  Administered Date(s) Administered  . Influenza, High Dose Seasonal PF 10/18/2014, 11/06/2015, 10/22/2017  . Influenza-Unspecified 10/13/2016  . Pneumococcal Conjugate-13 06/21/2014  . Pneumococcal Polysaccharide-23 01/21/2012  . Td 01/21/2012    Past Surgical History:  Procedure Laterality Date  . ABDOMINAL HYSTERECTOMY  1981   partial  . APPENDECTOMY    . BACK  SURGERY    . CESAREAN SECTION    . SALPINGOOPHORECTOMY  1981 left   1982 right    FHx:    Reviewed / unchanged  SHx:    Reviewed / unchanged   Systems Review:  Constitutional: Denies fever, chills, wt changes, headaches, insomnia, fatigue, night sweats, change in appetite. Eyes: Denies redness, blurred vision, diplopia, discharge, itchy, watery eyes.  ENT: Denies discharge, congestion, post nasal drip, epistaxis, sore throat, earache, hearing loss, dental pain, tinnitus, vertigo, sinus pain, snoring.  CV: Denies chest pain, palpitations, irregular heartbeat, syncope, dyspnea, diaphoresis, orthopnea, PND, claudication or edema. Respiratory: denies cough, dyspnea, DOE, pleurisy, hoarseness, laryngitis, wheezing.  Gastrointestinal: Denies dysphagia, odynophagia, heartburn, reflux, water brash, abdominal pain or cramps, nausea,  vomiting, bloating, diarrhea, constipation, hematemesis, melena, hematochezia  or hemorrhoids. Genitourinary: Denies dysuria, frequency, urgency, nocturia, hesitancy, discharge, hematuria or flank pain. Musculoskeletal: Denies arthralgias, myalgias, stiffness, jt. swelling, pain, limping or strain/sprain.  Skin: Denies pruritus, rash, hives, warts, acne, eczema or change in skin lesion(s). Neuro: No weakness, tremor, incoordination, spasms, paresthesia or pain. Psychiatric: Denies confusion, memory loss or sensory loss. Endo: Denies change in weight, skin or hair change.  Heme/Lymph: No excessive bleeding, bruising or enlarged lymph nodes.  Physical Exam  BP 124/82   Pulse 94   Temp (!) 97.4 F (36.3 C)   Resp 16   Ht 5\' 1"  (1.549 m)   Wt 168 lb 3.2 oz (76.3 kg)   SpO2 97%   BMI 31.78 kg/m   Appears  well nourished, well groomed  and in no distress.  Eyes: PERRLA, EOMs, conjunctiva no swelling or erythema. Sinuses: No frontal/maxillary tenderness ENT/Mouth: EAC's clear, TM's nl w/o erythema, bulging. Nares clear w/o erythema, swelling, exudates. Oropharynx clear without erythema or exudates. Oral hygiene is good. Tongue normal, non obstructing. Hearing intact.  Neck: Supple. Thyroid not palpable. Car 2+/2+ without bruits, nodes or JVD. Chest: Respirations nl with BS clear & equal w/o rales, rhonchi, wheezing or stridor.  Cor: Heart sounds normal w/ regular rate and rhythm without sig. murmurs, gallops, clicks or rubs. Peripheral pulses normal and equal  without edema.  Abdomen: Soft & bowel sounds normal. Non-tender w/o guarding, rebound, hernias, masses or organomegaly.  Lymphatics: Unremarkable.  Musculoskeletal: Full ROM all peripheral extremities, joint stability, 5/5 strength and normal gait.  Skin: Warm, dry without exposed rashes, lesions or ecchymosis apparent.  Neuro: Cranial nerves intact, reflexes equal bilaterally. Sensory-motor testing grossly intact. Tendon reflexes  grossly intact.  Pysch: Alert & oriented x 3.  Insight and judgement nl & appropriate. No ideations.  Assessment and Plan:  1. Essential hypertension  - Continue medication, monitor blood pressure at home.  - Continue DASH diet.  Reminder to go to the ER if any CP,  SOB, nausea, dizziness, severe HA, changes vision/speech.  - CBC with Differential/Platelet - COMPLETE METABOLIC PANEL WITH GFR - Magnesium - TSH  2. Hyperlipidemia, mixed  - Continue diet/meds, exercise,& lifestyle modifications.  - Continue monitor periodic cholesterol/liver & renal functions   - Lipid panel - TSH  3. Abnormal glucose  - Continue diet, exercise  - Lifestyle modifications.  - Monitor appropriate labs.  - Hemoglobin A1c - Insulin, random  4. Vitamin D deficiency  - Continue supplementation.  - VITAMIN D 25 Hydroxy   5. Hypothyroidism  - TSH  6. Medication management  - CBC with Differential/Platelet - COMPLETE METABOLIC PANEL WITH GFR - Magnesium - Lipid panel - TSH - Hemoglobin A1c - Insulin, random - VITAMIN  D 25 Hydroxy         Discussed  regular exercise, BP monitoring, weight control to achieve/maintain BMI less than 25 and discussed med and SE's. Recommended labs to assess and monitor clinical status with further disposition pending results of labs.  I discussed the assessment and treatment plan with the patient. The patient was provided an opportunity to ask questions and all were answered. The patient agreed with the plan and demonstrated an understanding of the instructions.  I provided over 30 minutes of exam, counseling, chart review and  complex critical decision making.         The patient was advised to call back  And schedule a 3 month OV and alos that she overdue for the year for a Medicare Wellness visit.    Kirtland Bouchard, MD

## 2019-11-07 ENCOUNTER — Other Ambulatory Visit: Payer: Self-pay

## 2019-11-07 ENCOUNTER — Ambulatory Visit (INDEPENDENT_AMBULATORY_CARE_PROVIDER_SITE_OTHER): Payer: PPO | Admitting: Internal Medicine

## 2019-11-07 ENCOUNTER — Encounter: Payer: Self-pay | Admitting: Internal Medicine

## 2019-11-07 VITALS — BP 124/82 | HR 94 | Temp 97.4°F | Resp 16 | Ht 61.0 in | Wt 168.2 lb

## 2019-11-07 DIAGNOSIS — E782 Mixed hyperlipidemia: Secondary | ICD-10-CM

## 2019-11-07 DIAGNOSIS — R7309 Other abnormal glucose: Secondary | ICD-10-CM | POA: Diagnosis not present

## 2019-11-07 DIAGNOSIS — I10 Essential (primary) hypertension: Secondary | ICD-10-CM | POA: Diagnosis not present

## 2019-11-07 DIAGNOSIS — E559 Vitamin D deficiency, unspecified: Secondary | ICD-10-CM

## 2019-11-07 DIAGNOSIS — E039 Hypothyroidism, unspecified: Secondary | ICD-10-CM

## 2019-11-07 DIAGNOSIS — R300 Dysuria: Secondary | ICD-10-CM | POA: Diagnosis not present

## 2019-11-07 DIAGNOSIS — Z79899 Other long term (current) drug therapy: Secondary | ICD-10-CM | POA: Diagnosis not present

## 2019-11-07 NOTE — Patient Instructions (Signed)

## 2019-11-08 LAB — CBC WITH DIFFERENTIAL/PLATELET
Absolute Monocytes: 518 cells/uL (ref 200–950)
Basophils Absolute: 57 cells/uL (ref 0–200)
Basophils Relative: 0.8 %
Eosinophils Absolute: 107 cells/uL (ref 15–500)
Eosinophils Relative: 1.5 %
HCT: 43.7 % (ref 35.0–45.0)
Hemoglobin: 14.6 g/dL (ref 11.7–15.5)
Lymphs Abs: 1896 cells/uL (ref 850–3900)
MCH: 31 pg (ref 27.0–33.0)
MCHC: 33.4 g/dL (ref 32.0–36.0)
MCV: 92.8 fL (ref 80.0–100.0)
MPV: 10.9 fL (ref 7.5–12.5)
Monocytes Relative: 7.3 %
Neutro Abs: 4523 cells/uL (ref 1500–7800)
Neutrophils Relative %: 63.7 %
Platelets: 340 10*3/uL (ref 140–400)
RBC: 4.71 10*6/uL (ref 3.80–5.10)
RDW: 12 % (ref 11.0–15.0)
Total Lymphocyte: 26.7 %
WBC: 7.1 10*3/uL (ref 3.8–10.8)

## 2019-11-08 LAB — URINALYSIS, ROUTINE W REFLEX MICROSCOPIC
Bilirubin Urine: NEGATIVE
Glucose, UA: NEGATIVE
Hgb urine dipstick: NEGATIVE
Ketones, ur: NEGATIVE
Leukocytes,Ua: NEGATIVE
Nitrite: NEGATIVE
Protein, ur: NEGATIVE
Specific Gravity, Urine: 1.005 (ref 1.001–1.03)
pH: 6.5 (ref 5.0–8.0)

## 2019-11-08 LAB — LIPID PANEL
Cholesterol: 201 mg/dL — ABNORMAL HIGH (ref <200)
HDL: 41 mg/dL — ABNORMAL LOW (ref >=50)
Non-HDL Cholesterol (Calc): 160 mg/dL (calc) — ABNORMAL HIGH (ref <130)
Total CHOL/HDL Ratio: 4.9 (calc) (ref <5.0)
Triglycerides: 484 mg/dL — ABNORMAL HIGH (ref <150)

## 2019-11-08 LAB — COMPLETE METABOLIC PANEL WITH GFR
AG Ratio: 2 (calc) (ref 1.0–2.5)
ALT: 20 U/L (ref 6–29)
AST: 25 U/L (ref 10–35)
Albumin: 5 g/dL (ref 3.6–5.1)
Alkaline phosphatase (APISO): 63 U/L (ref 37–153)
BUN: 13 mg/dL (ref 7–25)
CO2: 30 mmol/L (ref 20–32)
Calcium: 10.4 mg/dL (ref 8.6–10.4)
Chloride: 101 mmol/L (ref 98–110)
Creat: 0.66 mg/dL (ref 0.60–0.93)
GFR, Est African American: 101 mL/min/{1.73_m2} (ref >=60)
GFR, Est Non African American: 87 mL/min/{1.73_m2} (ref >=60)
Globulin: 2.5 g/dL (calc) (ref 1.9–3.7)
Glucose, Bld: 134 mg/dL — ABNORMAL HIGH (ref 65–99)
Potassium: 4.7 mmol/L (ref 3.5–5.3)
Sodium: 143 mmol/L (ref 135–146)
Total Bilirubin: 0.4 mg/dL (ref 0.2–1.2)
Total Protein: 7.5 g/dL (ref 6.1–8.1)

## 2019-11-08 LAB — HEMOGLOBIN A1C
Hgb A1c MFr Bld: 5.5 % of total Hgb (ref <5.7)
Mean Plasma Glucose: 111 (calc)
eAG (mmol/L): 6.2 (calc)

## 2019-11-08 LAB — MAGNESIUM: Magnesium: 2.4 mg/dL (ref 1.5–2.5)

## 2019-11-08 LAB — INSULIN, RANDOM: Insulin: 44.7 u[IU]/mL — ABNORMAL HIGH

## 2019-11-08 LAB — VITAMIN D 25 HYDROXY (VIT D DEFICIENCY, FRACTURES): Vit D, 25-Hydroxy: 91 ng/mL (ref 30–100)

## 2019-11-08 LAB — TSH: TSH: 4.51 mIU/L — ABNORMAL HIGH (ref 0.40–4.50)

## 2019-11-08 NOTE — Progress Notes (Signed)
========================================================== -   Test results slightly outside the reference range are not unusual. If there is anything important, I will review this with you,  otherwise it is considered normal test values.  If you have further questions,  please do not hesitate to contact me at the office or via My Chart.  ==========================================================  -  Total Cholesterol Worse - gone up from 181 to now 201  (Ideal or goal is less than 180)   - and LDL Chol is still too high  (Ideal or Goal is less than 70  ! )   - You need to work harder on low chol diet or will need to start very expensive Chol meds.  - Cholesterol only comes from animal sources  - ie. meat, dairy, egg yolks  - Eat all the vegetables you want.  - Avoid meat, especially red meat - Beef AND Pork .  - Avoid cheese & dairy - milk & ice cream.     - Cheese is the most concentrated form of trans-fats which  is the worst thing to clog up our arteries.   - Veggie cheese is OK which can be found in the fresh  produce section at [employer] or [employer] or [employer] ==========================================================  - Also, Triglycerides (  484  ) or fats in blood are too high  (goal is less than 150)    - Recommend avoid fried & greasy foods,  sweets / candy,   - Avoid white rice  (brown or wild rice or Quinoa is OK),   - Avoid white potatoes  (sweet potatoes are OK)   - Avoid anything made from white flour  - bagels, doughnuts, rolls, buns, biscuits, white and   wheat breads, pizza crust and traditional  pasta made of white flour & egg white  - (vegetarian pasta or spinach or wheat pasta is OK).    - Multi-grain bread is OK - like multi-grain flat bread or  sandwich thins.   - Avoid alcohol in excess.   - Exercise is also important. ==========================================================  - Thyroid is borderline   Be sure to take  thyroid med on an empty stomach with only water for 30 minutes & no Antacid meds, Calcium or Magnesium for 4 hours & avoid Biotin ==========================================================  - A1c - Normal - Great - No Diabetes ! ==========================================================  - Vitamin D  = 91 - Excellent  ! ==========================================================  - All OK except Chol & Trig's - So work Glass blower/designer with diet  ==========================================================

## 2019-11-17 ENCOUNTER — Other Ambulatory Visit: Payer: Self-pay

## 2019-11-17 ENCOUNTER — Encounter: Payer: Self-pay | Admitting: Adult Health Nurse Practitioner

## 2019-11-17 ENCOUNTER — Ambulatory Visit (INDEPENDENT_AMBULATORY_CARE_PROVIDER_SITE_OTHER): Payer: PPO | Admitting: Adult Health Nurse Practitioner

## 2019-11-17 VITALS — BP 110/68 | HR 60 | Temp 97.6°F | Ht 61.0 in | Wt 167.0 lb

## 2019-11-17 DIAGNOSIS — Z0001 Encounter for general adult medical examination with abnormal findings: Secondary | ICD-10-CM | POA: Diagnosis not present

## 2019-11-17 DIAGNOSIS — E559 Vitamin D deficiency, unspecified: Secondary | ICD-10-CM | POA: Diagnosis not present

## 2019-11-17 DIAGNOSIS — R300 Dysuria: Secondary | ICD-10-CM

## 2019-11-17 DIAGNOSIS — Z6829 Body mass index (BMI) 29.0-29.9, adult: Secondary | ICD-10-CM

## 2019-11-17 DIAGNOSIS — G4700 Insomnia, unspecified: Secondary | ICD-10-CM

## 2019-11-17 DIAGNOSIS — G720 Drug-induced myopathy: Secondary | ICD-10-CM | POA: Diagnosis not present

## 2019-11-17 DIAGNOSIS — K219 Gastro-esophageal reflux disease without esophagitis: Secondary | ICD-10-CM

## 2019-11-17 DIAGNOSIS — I10 Essential (primary) hypertension: Secondary | ICD-10-CM

## 2019-11-17 DIAGNOSIS — G729 Myopathy, unspecified: Secondary | ICD-10-CM

## 2019-11-17 DIAGNOSIS — R6889 Other general symptoms and signs: Secondary | ICD-10-CM

## 2019-11-17 DIAGNOSIS — E039 Hypothyroidism, unspecified: Secondary | ICD-10-CM

## 2019-11-17 DIAGNOSIS — E782 Mixed hyperlipidemia: Secondary | ICD-10-CM

## 2019-11-17 DIAGNOSIS — F325 Major depressive disorder, single episode, in full remission: Secondary | ICD-10-CM

## 2019-11-17 DIAGNOSIS — R7309 Other abnormal glucose: Secondary | ICD-10-CM

## 2019-11-17 DIAGNOSIS — Z79899 Other long term (current) drug therapy: Secondary | ICD-10-CM

## 2019-11-17 DIAGNOSIS — E663 Overweight: Secondary | ICD-10-CM | POA: Diagnosis not present

## 2019-11-17 DIAGNOSIS — Z0000 Encounter for general adult medical examination without abnormal findings: Secondary | ICD-10-CM

## 2019-11-17 LAB — TSH: TSH: 2.77 mIU/L (ref 0.40–4.50)

## 2019-11-17 MED ORDER — FAMOTIDINE 20 MG PO TABS: 20.0000 mg | ORAL_TABLET | Freq: Every day | ORAL | 1 refills | Status: AC

## 2019-11-17 MED ORDER — FAMOTIDINE 20 MG PO TABS: 20.0000 mg | ORAL_TABLET | Freq: Every day | ORAL | 1 refills | Status: DC

## 2019-11-17 MED ORDER — FENOFIBRATE 145 MG PO TABS: 145.0000 mg | ORAL_TABLET | Freq: Every day | ORAL | 11 refills | Status: DC

## 2019-11-17 NOTE — Progress Notes (Signed)
MEDICARE ANNUAL WELLNESS VISIT AND 14MONTH FOLLOW UP   Assessment:   [name] was seen today for follow-up and medicare wellness.  Diagnoses and all orders for this visit:  Medicare annual wellness visit, subsequent Yearly  Essential hypertension Has lasix PRN Denies any edema Continue to monitor blood pressure  Hyperlipidemia, mixed Statin Myopathy Continue zetia Has tried Crestor, increased myalgias Will check lipids next office visit  Hypothyroidism, unspecified type Taking levothyroxine 91mcg daily Reminder to take on an empty stomach 30-30mins before first meal of the day. No antacid medications for 4 hours.  Vitamin D deficiency Continue supplementation  Abnormal glucose Discussed dietary and exercise modifications  Insomnia, unspecified type Discussed melatonin nightly Decrease stimulation, screen time Exercise regularly  Depression, major in remission  No medicaitons Doing well at this time   Overweight BMI 29.0-29.9,adult Discussed dietary and exercise modifications  GERD Rx Famotadine 20mg  daily or BID  Contack office if not resolved Consider Omeprazole short term treatment? Avoids triggers   Medication management continued   Over 30 minutes of non face to face interview, counseling, chart review and critical decision making was performed Future Appointments  Date Time Provider [place]  02/27/2020  2:00 PM Garnet Sierras, NP [facility] None  11/15/2020  2:30 PM Eula Mazzola, Danton Sewer, NP [facility] None     Plan:   During the course of the visit the patient was educated and counseled about appropriate screening and preventive services including:    Pneumococcal vaccine   Prevnar 13  Influenza vaccine  Td vaccine  Screening electrocardiogram  Bone densitometry screening  Colorectal cancer screening  Diabetes screening  Glaucoma screening  Nutrition counseling   Advanced directives: requested   Subjective:   [name] is a 74 y.o. female who presents for Medicare Annual Wellness Visit and 3 month follow up for HTN, HLD, hypothyroidism, insomnia, weight and Vitamin D Deficiency.   She reports that she is having abdominal fullness and fluttering at times in epigastric region.  She reports excessive purping after eating.  Reports that at times she feels like a pill or some food will get stuck.    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  She does take some tums occasionally.   [name] Her blood pressure has been controlled she has not checked her blood pressure since going to her daughters house in [place]. BP: 110/68 She does workout. She denies chest pain, shortness of breath, dizziness.  She is on cholesterol medication and denies myalgias. Her cholesterol is not at goal. The cholesterol last visit was:   Lab Results  Component Value Date   CHOL 181 02/24/2018   HDL 43 (L) 02/24/2018   LDLCALC 98 02/24/2018   TRIG 301 (H) 02/24/2018   CHOLHDL 4.2 02/24/2018   . She has been working on diet and exercise, we monitor for prediabetes, and denies increased appetite, nausea, paresthesia of the feet, polydipsia, polyuria, visual disturbances, vomiting and weight loss. Last A1C in the office was:  Lab Results  Component Value Date   HGBA1C 5.5 11/07/2019   Last GFR:   Lab Results  Component Value Date   GFRNONAA 87 11/07/2019   Lab Results  Component Value Date   GFRAA 101 11/07/2019   Patient is on Vitamin D supplement.   Lab Results  Component Value Date   VD25OH 91 11/07/2019      Medication Review: Current Outpatient Medications on File Prior to Visit  Medication Sig Dispense Refill  . Acetaminophen (TYLENOL EXTRA  STRENGTH PO) Take by mouth.    [name] aspirin 81 MG tablet Take 81 mg by mouth daily.    . Cholecalciferol (VITAMIN D PO) Take 2,000 Int'l Units by mouth 2 (two) times daily.     . cyclobenzaprine (FLEXERIL) 10 MG tablet Take 1/2 to 1 tablets 2 to 3 x / day if needed for  Muscle Spasms or Headaches 90 tablet 3  . ezetimibe (ZETIA) 10 MG tablet Take 1 tablet   Daily   for Cholesterol - Must have office visit before any further Refills 30 tablet 0  . furosemide (LASIX) 40 MG tablet Use 1 tablet 2 x /day for BP & Fluid 180 tablet 3  . GINKGO BILOBA PLUS PO Take 60 mg by mouth daily.    [name] levothyroxine (SYNTHROID) 75 MCG tablet TAKE 1 TABLET DAILY ON AN EMPTY STOMACH WITH ONLY WATER FOR 30 MINUTES & NO ANTACID MEDS, CALCIUM OR MAGNESIUM FOR 4 HOURS & AVOID BIOTIN 90 tablet 0  . Melatonin 3 MG CAPS Take by mouth at bedtime.    . vitamin C (ASCORBIC ACID) 500 MG tablet Take 1,000 mg by mouth daily.      No current facility-administered medications on file prior to visit.    Allergies  Allergen Reactions  . Aspirin     REACTION: GI ulcers  . Fish Oil     Allergic to fish oil and shell fish  . Morphine And Related   . Sulfa Antibiotics   . Vicodin [Hydrocodone-Acetaminophen] Nausea Only    Current Problems (verified) Patient Active Problem List   Diagnosis Date Noted  . Insomnia 05/29/2018  . Hypothyroid 04/13/2017  . Benign paroxysmal positional vertigo 02/07/2016  . BMI 29.0-29.9,adult 09/21/2014  . Encounter for Medicare annual wellness exam 09/21/2014  . Medication management 08/15/2013  . Abnormal glucose 01/26/2013  . Vitamin D deficiency 12/13/2012  . GERD   . Hyperlipidemia, mixed   . Depression, major, in remission ([place])   . Essential hypertension 09/09/2008    Screening Tests Immunization History  Administered Date(s) Administered  . Fluad Quad(high Dose 65+) 10/23/2019  . Influenza, High Dose Seasonal PF 10/18/2014, 11/06/2015, 10/22/2017  . Influenza-Unspecified 10/13/2016, 10/26/2019  . PFIZER SARS-COV-2 Vaccination 11/13/2019  . Pneumococcal Conjugate-13 06/21/2014  . Pneumococcal Polysaccharide-23 01/21/2012  . Td 01/21/2012    Preventative care: Last colonoscopy: 2010, Had in [facility] 2020 Last mammogram: 2019, Had in [facility]  12/2018 DEXA: 2020  Prior vaccinations: TD or Tdap: 2014  Influenza: 10/2019 Pneumococcal: 2014 Prevnar13: 2016 Shingles/Zostavax: Discussed with patient, Shingrix.    Names of Other Physician/Practitioners you currently use: 1. [facility] here for primary care 2. Eye Exam 2019 3. Dental Exam , Due  Patient Care Team: Unk Pinto, MD as PCP - General (Internal Medicine) Lafayette Dragon, MD (Inactive) as Consulting Physician (Gastroenterology)  SURGICAL HISTORY She  has a past surgical history that includes Back surgery; Appendectomy; Cesarean section; Abdominal hysterectomy (1981); and Salpingoophorectomy (1981 left). FAMILY HISTORY Her family history includes Diabetes in her mother; Heart disease in her father, mother, and sister; Hypertension in her father; Kidney disease in her mother. SOCIAL HISTORY She  reports that she quit smoking about 11 years ago. She has never used smokeless tobacco. She reports that she does not drink alcohol and does not use drugs.   MEDICARE WELLNESS OBJECTIVES: Physical activity: Current Exercise Habits: Home exercise routine, Time (Minutes): 45, Frequency (Times/Week): 5, Weekly Exercise (Minutes/Week): 225, Intensity: Moderate Cardiac risk factors: Cardiac  Risk Factors include: dyslipidemia;advanced age (>35men, >44 women) Depression/mood screen:   Depression screen [facility] 2/9 11/17/2019  Decreased Interest -  Down, Depressed, Hopeless 0  PHQ - 2 Score 0  Altered sleeping -  Tired, decreased energy -  Change in appetite -  Feeling bad or failure about yourself  -  Trouble concentrating -  Moving slowly or fidgety/restless -  PHQ-9 Score -  Difficult doing work/chores -    ADLs:  In your present state of health, do you have any difficulty performing the following activities: 11/17/2019  Hearing? N  Vision? N  Difficulty concentrating or making decisions? N  Walking or climbing stairs? N   Dressing or bathing? N  Doing errands, shopping? N  Preparing Food and eating ? N  Using the Toilet? N  In the past six months, have you accidently leaked urine? N  Do you have problems with loss of bowel control? N  Managing your Medications? N  Managing your Finances? N  Housekeeping or managing your Housekeeping? N  Some recent data might be hidden     Cognitive Testing  Alert? Yes  Normal Appearance?Yes  Oriented to person? Yes  Place? Yes   Time? Yes  Recall of three objects?  Yes  Can perform simple calculations? Yes  Displays appropriate judgment?Yes  Can read the correct time from a watch face?Yes  EOL planning: Does Patient Have a Medical Advance Directive?: No Would patient like information on creating a medical advance directive?: No - Patient declined  Review of Systems  Constitutional: Negative for chills, diaphoresis, fever, malaise/fatigue and weight loss.  HENT: Negative for congestion, ear discharge, ear pain, hearing loss, nosebleeds, sinus pain, sore throat and tinnitus.   Eyes: Negative for blurred vision, double vision, photophobia, pain, discharge and redness.  Respiratory: Negative for cough, hemoptysis, sputum production, shortness of breath, wheezing and stridor.   Cardiovascular: Negative for chest pain, palpitations, orthopnea, claudication, leg swelling and PND.  Gastrointestinal: Negative for abdominal pain, blood in stool, constipation, diarrhea, heartburn, melena, nausea and vomiting.  Genitourinary: Negative for dysuria, flank pain, frequency, hematuria and urgency.  Musculoskeletal: Negative for back pain, falls, joint pain, myalgias and neck pain.  Skin: Negative for itching and rash.  Neurological: Negative for dizziness, tingling, tremors, sensory change, speech change, focal weakness, seizures, loss of consciousness, weakness and headaches.  Endo/Heme/Allergies: Negative for environmental allergies and polydipsia. Does not bruise/bleed easily.   Psychiatric/Behavioral: Negative for depression, hallucinations, memory loss, substance abuse and suicidal ideas. The patient is not nervous/anxious and does not have insomnia.      Objective:     Today's Vitals   11/17/19 1459  BP: 110/68  Pulse: 60  Temp: 97.6 F (36.4 C)  SpO2: 97%  Weight: 167 lb (75.8 kg)  Height: 5\' 1"  (1.549 m)   Physical Exam:  BP 110/68   Pulse 60   Temp 97.6 F (36.4 C)   Ht 5\' 1"  (1.549 m)   Wt 167 lb (75.8 kg)   SpO2 97%   BMI 31.55 kg/m   General Appearance: Well nourished, in no apparent distress. Eyes: PERRLA, EOMs, conjunctiva no swelling or erythema Sinuses: No Frontal/maxillary tenderness ENT/Mouth: Ext aud canals clear, TMs without erythema, bulging. No erythema, swelling, or exudate on post pharynx.  Tonsils not swollen or erythematous. Hearing normal.  Neck: Supple, thyroid normal.  Respiratory: Respiratory effort normal, BS equal bilaterally without rales, rhonchi, wheezing or stridor.  Cardio: RRR with no MRGs. Brisk peripheral pulses with scant non-pitting  edema   Abdomen: Soft, obese/mildly distended, + BS.  Non tender, no guarding, rebound, hernias, masses. Lymphatics: Non tender without lymphadenopathy.  Musculoskeletal: Full ROM, 5/5 strength, normal gait.  Skin: Warm, dry without rashes, lesions; he has fragile skin and numerous small ecchymoses to bilateral upper extremities Neuro: Cranial nerves intact. Normal muscle tone, no cerebellar symptoms. Sensation intact.  Psych: Awake and oriented X 3, normal affect, Insight and Judgment appropriate.    Medicare Attestation I have personally reviewed: The patient's medical and social history Their use of alcohol, tobacco or illicit drugs Their current medications and supplements The patient's functional ability including ADLs,fall risks, home safety risks, cognitive, and hearing and visual impairment Diet and physical activities Evidence for depression or mood  disorders  The patient's weight, height, BMI, and visual acuity have been recorded in the chart.  I have made referrals, counseling, and provided education to the patient based on review of the above and I have provided the patient with a written personalized care plan for preventive services.      Garnet Sierras, Laqueta Jean, DNP [facility] 11/17/2019  4:44 PM

## 2019-11-29 ENCOUNTER — Other Ambulatory Visit: Payer: Self-pay | Admitting: Internal Medicine

## 2019-12-05 DIAGNOSIS — H18513 Endothelial corneal dystrophy, bilateral: Secondary | ICD-10-CM | POA: Diagnosis not present

## 2019-12-27 DIAGNOSIS — Z1231 Encounter for screening mammogram for malignant neoplasm of breast: Secondary | ICD-10-CM | POA: Diagnosis not present

## 2020-01-24 DIAGNOSIS — G2581 Restless legs syndrome: Secondary | ICD-10-CM | POA: Diagnosis not present

## 2020-01-24 DIAGNOSIS — R002 Palpitations: Secondary | ICD-10-CM | POA: Diagnosis not present

## 2020-01-24 DIAGNOSIS — R0602 Shortness of breath: Secondary | ICD-10-CM | POA: Diagnosis not present

## 2020-01-24 DIAGNOSIS — Z7982 Long term (current) use of aspirin: Secondary | ICD-10-CM | POA: Diagnosis not present

## 2020-01-26 ENCOUNTER — Other Ambulatory Visit: Payer: Self-pay | Admitting: Internal Medicine

## 2020-02-20 DIAGNOSIS — I358 Other nonrheumatic aortic valve disorders: Secondary | ICD-10-CM | POA: Diagnosis not present

## 2020-02-27 ENCOUNTER — Encounter: Payer: PPO | Admitting: Adult Health Nurse Practitioner

## 2020-04-19 ENCOUNTER — Other Ambulatory Visit: Payer: Self-pay | Admitting: Internal Medicine

## 2020-05-14 ENCOUNTER — Encounter: Payer: Self-pay | Admitting: Internal Medicine

## 2020-05-14 ENCOUNTER — Other Ambulatory Visit: Payer: Self-pay

## 2020-05-14 ENCOUNTER — Ambulatory Visit (INDEPENDENT_AMBULATORY_CARE_PROVIDER_SITE_OTHER): Payer: PPO | Admitting: Internal Medicine

## 2020-05-14 VITALS — BP 138/74 | HR 72 | Temp 97.7°F | Resp 16 | Ht 61.0 in | Wt 168.2 lb

## 2020-05-14 DIAGNOSIS — E039 Hypothyroidism, unspecified: Secondary | ICD-10-CM

## 2020-05-14 DIAGNOSIS — Z79899 Other long term (current) drug therapy: Secondary | ICD-10-CM

## 2020-05-14 DIAGNOSIS — I10 Essential (primary) hypertension: Secondary | ICD-10-CM

## 2020-05-14 DIAGNOSIS — E559 Vitamin D deficiency, unspecified: Secondary | ICD-10-CM

## 2020-05-14 DIAGNOSIS — E782 Mixed hyperlipidemia: Secondary | ICD-10-CM | POA: Diagnosis not present

## 2020-05-14 DIAGNOSIS — R7309 Other abnormal glucose: Secondary | ICD-10-CM | POA: Diagnosis not present

## 2020-05-14 NOTE — Progress Notes (Signed)
Future Appointments  Date Time Provider [place]  05/14/2020  4:00 PM Unk Pinto, MD [facility] None  11/15/2020  2:30 PM Garnet Sierras, NP [facility] None    History of Present Illness:       This very nice 75 y.o. WWF  presents for 6 month follow up with HTN, HLD, Pre-Diabetes and Vitamin D Deficiency.        Patient is treated for HTN (1995) & BP has been controlled at home. Today's BP is at goal - 138/74. Patient has had no complaints of any cardiac type chest pain, palpitations, dyspnea / orthopnea / PND, dizziness, claudication, or dependent edema.       Hyperlipidemia is controlled with diet & meds. Patient denies myalgias or other med SE's. Last Lipids were  Lab Results  Component Value Date   CHOL 201 (H) 11/07/2019   HDL 41 (L) 11/07/2019   LDLCALC not calculated 11/07/2019   TRIG 484 (H) 11/07/2019   CHOLHDL 4.9 11/07/2019     Also, the patient has history of PreDiabetes and has had no symptoms of reactive hypoglycemia, diabetic polys, paresthesias or visual blurring.  Last A1c was   Lab Results  Component Value Date   HGBA1C 5.5 11/07/2019        Further, the patient also has history of Vitamin D Deficiency and supplements vitamin D without any suspected side-effects. Last vitamin D was  Lab Results  Component Value Date   VD25OH 80 11/07/2019     Current Outpatient Medications on File Prior to Visit  Medication Sig  . Acetaminophen (TYLENOL EXTRA STRENGTH PO) Take by mouth.  [name] aspirin 81 MG tablet Take  daily.  . Calcium Carbonate (CALCIUM 500 PO) Take 1 tablet  daily.  [name] VITAMIN D 2,000 Int'l Units Take 2 (two) times daily.   . cyclobenzaprine  10 MG tablet Take 1/2 to 1 tablets 2 to 3 x / day if needed  . ezetimibe 10 MG tablet Take 1 tablet Daily  . famotidine 20 MG tablet Take 1 tablet  daily.  . fenofibrate  145 MG tablet Take 1 tablet  daily.  . furosemide  40 MG tablet Use 1 tablet 2 x /day for BP & Fluid  . gabapentin  300 MG capsule Take  at bedtime.  [name] GINKGO BILOBA 60 mg  Take  daily.  [name] levothyroxine  75 MCG tablet Take  1 tablet  Daily    . TURMERIC PO 1,500 mg  Take daily.  . vitamin C 500 MG tablet Take 1,000 mg daily.     Allergies  Allergen Reactions  . Aspirin     REACTION: GI ulcers  . Fish Oil     Allergic to fish oil and shell fish  . Morphine And Related   . Sulfa Antibiotics   . Vicodin [Hydrocodone-Acetaminophen] Nausea Only   PMHx:   Past Medical History:  Diagnosis Date  . Vitamin D deficiency     Immunization History  Administered Date(s) Administered  . Fluad Quad(high Dose 65+) 10/23/2019  . Influenza, High Dose Seasonal PF 10/18/2014, 11/06/2015, 10/22/2017  . Influenza-Unspecified 10/13/2016, 10/26/2019  . PFIZER(Purple Top)SARS-COV-2 Vaccination 11/13/2019  . Pneumococcal Conjugate-13 06/21/2014  . Pneumococcal Polysaccharide-23 01/21/2012  . Td 01/21/2012     Past Surgical History:  Procedure Laterality Date  . ABDOMINAL HYSTERECTOMY  1981   partial  . APPENDECTOMY    . BACK SURGERY    . CESAREAN SECTION    .  SALPINGOOPHORECTOMY  1981 left   1982 right    FHx:    Reviewed / unchanged  SHx:    Reviewed / unchanged   Systems Review:  Constitutional: Denies fever, chills, wt changes, headaches, insomnia, fatigue, night sweats, change in appetite. Eyes: Denies redness, blurred vision, diplopia, discharge, itchy, watery eyes.  ENT: Denies discharge, congestion, post nasal drip, epistaxis, sore throat, earache, hearing loss, dental pain, tinnitus, vertigo, sinus pain, snoring.  CV: Denies chest pain, palpitations, irregular heartbeat, syncope, dyspnea, diaphoresis, orthopnea, PND, claudication or edema. Respiratory: denies cough, dyspnea, DOE, pleurisy, hoarseness, laryngitis, wheezing.  Gastrointestinal: Denies dysphagia, odynophagia, heartburn, reflux, water brash, abdominal pain or cramps, nausea, vomiting, bloating, diarrhea, constipation, hematemesis,  melena, hematochezia  or hemorrhoids. Genitourinary: Denies dysuria, frequency, urgency, nocturia, hesitancy, discharge, hematuria or flank pain. Musculoskeletal: Denies arthralgias, myalgias, stiffness, jt. swelling, pain, limping or strain/sprain.  Skin: Denies pruritus, rash, hives, warts, acne, eczema or change in skin lesion(s). Neuro: No weakness, tremor, incoordination, spasms, paresthesia or pain. Psychiatric: Denies confusion, memory loss or sensory loss. Endo: Denies change in weight, skin or hair change.  Heme/Lymph: No excessive bleeding, bruising or enlarged lymph nodes.  Physical Exam  BP 138/74   Pulse 72   Temp 97.7 F (36.5 C)   Resp 16   Ht 5\' 1"  (1.549 m)   Wt 168 lb 3.2 oz (76.3 kg)   SpO2 96%   BMI 31.78 kg/m   Appears  well nourished, well groomed  and in no distress.  Eyes: PERRLA, EOMs, conjunctiva no swelling or erythema. Sinuses: No frontal/maxillary tenderness ENT/Mouth: EAC's clear, TM's nl w/o erythema, bulging. Nares clear w/o erythema, swelling, exudates. Oropharynx clear without erythema or exudates. Oral hygiene is good. Tongue normal, non obstructing. Hearing intact.  Neck: Supple. Thyroid not palpable. Car 2+/2+ without bruits, nodes or JVD. Chest: Respirations nl with BS clear & equal w/o rales, rhonchi, wheezing or stridor.  Cor: Heart sounds normal w/ regular rate and rhythm without sig. murmurs, gallops, clicks or rubs. Peripheral pulses normal and equal  without edema.  Abdomen: Soft & bowel sounds normal. Non-tender w/o guarding, rebound, hernias, masses or organomegaly.  Lymphatics: Unremarkable.  Musculoskeletal: Full ROM all peripheral extremities, joint stability, 5/5 strength and normal gait.  Skin: Warm, dry without exposed rashes, lesions or ecchymosis apparent.  Neuro: Cranial nerves intact, reflexes equal bilaterally. Sensory-motor testing grossly intact. Tendon reflexes grossly intact.  Pysch: Alert & oriented x 3.  Insight and  judgement nl & appropriate. No ideations.  Assessment and Plan:  - Continue medication, monitor blood pressure at home.  - Continue DASH diet.  Reminder to go to the ER if any CP,  SOB, nausea, dizziness, severe HA, changes vision/speech.  1. Essential hypertension  - CBC with Differential/Platelet - COMPLETE METABOLIC PANEL WITH GFR - Magnesium - TSH  2. Hyperlipidemia, mixed  - Continue diet/meds, exercise,& lifestyle modifications.  - Continue monitor periodic cholesterol/liver & renal functions   - Lipid panel - TSH  3. Abnormal glucose  - In - Continue diet, exercise  - Lifestyle modifications.  - Monitor appropriate labs sulin, random - Hemoglobin A1c  4. Vitamin D deficiency  - Continue supplementation.  - VITAMIN D 25 Hydroxy   5. Hypothyroidism  - TSH  6. Medication management  - CBC with Differential/Platelet - COMPLETE METABOLIC PANEL WITH GFR - Magnesium - Lipid panel - TSH - Insulin, random - VITAMIN D 25 Hydroxy         Discussed  regular exercise,  BP monitoring, weight control to achieve/maintain BMI less than 25 and discussed med and SE's. Recommended labs to assess and monitor clinical status with further disposition pending results of labs.  I discussed the assessment and treatment plan with the patient. The patient was provided an opportunity to ask questions and all were answered. The patient agreed with the plan and demonstrated an understanding of the instructions.  I provided over 30 minutes of exam, counseling, chart review and  complex critical decision making.        The patient was advised to call back or seek an in-person evaluation if the symptoms worsen or if the condition fails to improve as anticipated.   Kirtland Bouchard, MD

## 2020-05-14 NOTE — Patient Instructions (Addendum)

## 2020-05-14 NOTE — Progress Notes (Signed)
Future Appointments  Date Time Provider [place]  05/14/2020  4:00 PM Unk Pinto, MD [facility] None  11/15/2020  2:30 PM Garnet Sierras, NP [facility] None    History of Present Illness:       This very nice 75 y.o. WWF presents for80month follow up with HTN, HLD, Pre-Diabetes and Vitamin D Deficiency.        Patient is treated for HTN (1995) & BP has been controlled at home. Today's BP: 124/82. Patient has had no complaints of any cardiac type chest pain, palpitations, dyspnea / orthopnea / PND, dizziness, claudication, or dependent edema.       Hyperlipidemia is controlled with diet & meds. Patient denies myalgias or other med SE's. Last Lipids were not at goal with elevated TRig's:  Lab Results  Component Value Date   CHOL 201 (H) 11/07/2019   HDL 41 (L) 11/07/2019   LDLCALC not calculated 11/07/2019   TRIG 484 (H) 11/07/2019   CHOLHDL 4.9 11/07/2019     Also, the patient has history of PreDiabetes and has had no symptoms of reactive hypoglycemia, diabetic polys, paresthesias or visual blurring.  Last A1c was normal & at goal:  Lab Results  Component Value Date   HGBA1C 5.5 11/07/2019            Further, the patient also has history of Vitamin D Deficiency and supplements vitamin D without any suspected side-effects. Last vitamin D was at goal:  Last vitamin D Lab Results  Component Value Date   VD25OH 45 11/07/2019    Current Outpatient Medications on File Prior to Visit  Medication Sig  . aspirin 81 MG tablet Take 81 mg by mouth daily.  . Cholecalciferol (VITAMIN D PO) Take 2,000 Int'l Units by mouth 2 (two) times daily.   . cyclobenzaprine (FLEXERIL) 10 MG tablet Take 1/2 to 1 tablets 2 to 3 x / day if needed for Muscle Spasms or Headaches  . furosemide (LASIX) 40 MG tablet Use 1 tablet 2 x /day for BP & Fluid  . GINKGO BILOBA PLUS PO Take 60 mg by mouth daily.  . Melatonin 3 MG CAPS Take by mouth at bedtime.  . vitamin C (ASCORBIC  ACID) 500 MG tablet Take 1,000 mg by mouth daily.    No current facility-administered medications on file prior to visit.     Allergies  Allergen Reactions  . Aspirin     REACTION: GI ulcers  . Fish Oil     Allergic to fish oil and shell fish  . Morphine And Related   . Sulfa Antibiotics   . Vicodin [Hydrocodone-Acetaminophen] Nausea Only     PMHx:   Past Medical History:  Diagnosis Date  . Vitamin D deficiency      Immunization History  Administered Date(s) Administered  . Fluad Quad(high Dose 65+) 10/23/2019  . Influenza, High Dose Seasonal PF 10/18/2014, 11/06/2015, 10/22/2017  . Influenza-Unspecified 10/13/2016, 10/26/2019  . PFIZER(Purple Top)SARS-COV-2 Vaccination 11/13/2019  . Pneumococcal Conjugate-13 06/21/2014  . Pneumococcal Polysaccharide-23 01/21/2012  . Td 01/21/2012     Past Surgical History:  Procedure Laterality Date  . ABDOMINAL HYSTERECTOMY  1981   partial  . APPENDECTOMY    . BACK SURGERY    . CESAREAN SECTION    . SALPINGOOPHORECTOMY  1981 left   1982 right    FHx:    Reviewed / unchanged  SHx:    Reviewed / unchanged   Systems Review:  Constitutional: Denies fever, chills,  wt changes, headaches, insomnia, fatigue, night sweats, change in appetite. Eyes: Denies redness, blurred vision, diplopia, discharge, itchy, watery eyes.  ENT: Denies discharge, congestion, post nasal drip, epistaxis, sore throat, earache, hearing loss, dental pain, tinnitus, vertigo, sinus pain, snoring.  CV: Denies chest pain, palpitations, irregular heartbeat, syncope, dyspnea, diaphoresis, orthopnea, PND, claudication or edema. Respiratory: denies cough, dyspnea, DOE, pleurisy, hoarseness, laryngitis, wheezing.  Gastrointestinal: Denies dysphagia, odynophagia, heartburn, reflux, water brash, abdominal pain or cramps, nausea, vomiting, bloating, diarrhea, constipation, hematemesis, melena, hematochezia  or hemorrhoids. Genitourinary: Denies dysuria, frequency,  urgency, nocturia, hesitancy, discharge, hematuria or flank pain. Musculoskeletal: Denies arthralgias, myalgias, stiffness, jt. swelling, pain, limping or strain/sprain.  Skin: Denies pruritus, rash, hives, warts, acne, eczema or change in skin lesion(s). Neuro: No weakness, tremor, incoordination, spasms, paresthesia or pain. Psychiatric: Denies confusion, memory loss or sensory loss. Endo: Denies change in weight, skin or hair change.  Heme/Lymph: No excessive bleeding, bruising or enlarged lymph nodes.  Physical Exam  BP 124/82   Pulse 94   Temp (!) 97.4 F (36.3 C)   Resp 16   Ht 5\' 1"  (1.549 m)   Wt 168 lb 3.2 oz (76.3 kg)   SpO2 97%   BMI 31.78 kg/m   Appears  well nourished, well groomed  and in no distress.  Eyes: PERRLA, EOMs, conjunctiva no swelling or erythema. Sinuses: No frontal/maxillary tenderness ENT/Mouth: EAC's clear, TM's nl w/o erythema, bulging. Nares clear w/o erythema, swelling, exudates. Oropharynx clear without erythema or exudates. Oral hygiene is good. Tongue normal, non obstructing. Hearing intact.  Neck: Supple. Thyroid not palpable. Car 2+/2+ without bruits, nodes or JVD. Chest: Respirations nl with BS clear & equal w/o rales, rhonchi, wheezing or stridor.  Cor: Heart sounds normal w/ regular rate and rhythm without sig. murmurs, gallops, clicks or rubs. Peripheral pulses normal and equal  without edema.  Abdomen: Soft & bowel sounds normal. Non-tender w/o guarding, rebound, hernias, masses or organomegaly.  Lymphatics: Unremarkable.  Musculoskeletal: Full ROM all peripheral extremities, joint stability, 5/5 strength and normal gait.  Skin: Warm, dry without exposed rashes, lesions or ecchymosis apparent.  Neuro: Cranial nerves intact, reflexes equal bilaterally. Sensory-motor testing grossly intact. Tendon reflexes grossly intact.  Pysch: Alert & oriented x 3.  Insight and judgement nl & appropriate. No ideations.  Assessment and Plan:  - Continue  medication, monitor blood pressure at home.  - Continue DASH diet.  Reminder to go to the ER if any CP,  SOB, nausea, dizziness, severe HA, changes vision/speech.  - Continue diet/meds, exercise,& lifestyle modifications.  - Continue monitor periodic cholesterol/liver & renal functions    - Continue diet, exercise  - Lifestyle modifications.  - Monitor appropriate labs. - Continue supplementation.        Discussed  regular exercise, BP monitoring, weight control to achieve/maintain BMI less than 25 and discussed med and SE's. Recommended labs to assess and monitor clinical status with further disposition pending results of labs.  I discussed the assessment and treatment plan with the patient. The patient was provided an opportunity to ask questions and all were answered. The patient agreed with the plan and demonstrated an understanding of the instructions.  I provided over 30 minutes of exam, counseling, chart review and  complex critical decision making.        The patient was advised to call back or seek an in-person evaluation if the symptoms worsen or if the condition fails to improve as anticipated.   Kirtland Bouchard, MD

## 2020-05-15 ENCOUNTER — Other Ambulatory Visit: Payer: Self-pay | Admitting: Internal Medicine

## 2020-05-15 ENCOUNTER — Encounter: Payer: Self-pay | Admitting: Internal Medicine

## 2020-05-15 DIAGNOSIS — E782 Mixed hyperlipidemia: Secondary | ICD-10-CM

## 2020-05-15 LAB — CBC WITH DIFFERENTIAL/PLATELET
Absolute Monocytes: 470 cells/uL (ref 200–950)
Basophils Absolute: 73 cells/uL (ref 0–200)
Basophils Relative: 0.9 %
Eosinophils Absolute: 194 cells/uL (ref 15–500)
Eosinophils Relative: 2.4 %
HCT: 42.4 % (ref 35.0–45.0)
Hemoglobin: 13.9 g/dL (ref 11.7–15.5)
Lymphs Abs: 1879 cells/uL (ref 850–3900)
MCH: 30.1 pg (ref 27.0–33.0)
MCHC: 32.8 g/dL (ref 32.0–36.0)
MCV: 91.8 fL (ref 80.0–100.0)
MPV: 11.4 fL (ref 7.5–12.5)
Monocytes Relative: 5.8 %
Neutro Abs: 5484 cells/uL (ref 1500–7800)
Neutrophils Relative %: 67.7 %
Platelets: 346 10*3/uL (ref 140–400)
RBC: 4.62 10*6/uL (ref 3.80–5.10)
RDW: 12 % (ref 11.0–15.0)
Total Lymphocyte: 23.2 %
WBC: 8.1 10*3/uL (ref 3.8–10.8)

## 2020-05-15 LAB — COMPLETE METABOLIC PANEL WITH GFR
AG Ratio: 1.9 (calc) (ref 1.0–2.5)
ALT: 24 U/L (ref 6–29)
AST: 25 U/L (ref 10–35)
Albumin: 4.7 g/dL (ref 3.6–5.1)
Alkaline phosphatase (APISO): 68 U/L (ref 37–153)
BUN: 20 mg/dL (ref 7–25)
CO2: 32 mmol/L (ref 20–32)
Calcium: 9.6 mg/dL (ref 8.6–10.4)
Chloride: 101 mmol/L (ref 98–110)
Creat: 0.62 mg/dL (ref 0.60–0.93)
GFR, Est African American: 102 mL/min/{1.73_m2} (ref >=60)
GFR, Est Non African American: 88 mL/min/{1.73_m2} (ref >=60)
Globulin: 2.5 g/dL (calc) (ref 1.9–3.7)
Glucose, Bld: 95 mg/dL (ref 65–99)
Potassium: 4.4 mmol/L (ref 3.5–5.3)
Sodium: 143 mmol/L (ref 135–146)
Total Bilirubin: 0.4 mg/dL (ref 0.2–1.2)
Total Protein: 7.2 g/dL (ref 6.1–8.1)

## 2020-05-15 LAB — HEMOGLOBIN A1C
Hgb A1c MFr Bld: 5.5 % of total Hgb (ref <5.7)
Mean Plasma Glucose: 111 mg/dL
eAG (mmol/L): 6.2 mmol/L

## 2020-05-15 LAB — LIPID PANEL
Cholesterol: 248 mg/dL — ABNORMAL HIGH (ref <200)
HDL: 40 mg/dL — ABNORMAL LOW (ref >=50)
Non-HDL Cholesterol (Calc): 208 mg/dL (calc) — ABNORMAL HIGH (ref <130)
Total CHOL/HDL Ratio: 6.2 (calc) — ABNORMAL HIGH (ref <5.0)
Triglycerides: 542 mg/dL — ABNORMAL HIGH (ref <150)

## 2020-05-15 LAB — TSH: TSH: 1.75 mIU/L (ref 0.40–4.50)

## 2020-05-15 LAB — MAGNESIUM: Magnesium: 2.2 mg/dL (ref 1.5–2.5)

## 2020-05-15 LAB — VITAMIN D 25 HYDROXY (VIT D DEFICIENCY, FRACTURES): Vit D, 25-Hydroxy: 88 ng/mL (ref 30–100)

## 2020-05-15 LAB — INSULIN, RANDOM: Insulin: 10.1 u[IU]/mL

## 2020-05-15 MED ORDER — ROSUVASTATIN CALCIUM 10 MG PO TABS: ORAL_TABLET | ORAL | 1 refills | Status: DC

## 2020-05-15 NOTE — Progress Notes (Signed)
============================================================ - Test results slightly outside the reference range are not unusual. If there is anything important, I will review this with you,  otherwise it is considered normal test values.  If you have further questions,  please do not hesitate to contact me at the office or via My Chart.  ============================================================ ============================================================  - Total Chol = 248 - Very Very High  !      (  Ideal or Goal  is less than 180  !  )   - and Triglycerides (   542   ) or fats in blood are also way too high            (   Ideal or Goal is less than 150  !  )    - Recommend avoid fried & greasy foods,  sweets / candy,   - Avoid white rice  (brown or wild rice or Quinoa is OK),   - Avoid white potatoes  (sweet potatoes are OK)   - Avoid anything made from white flour  - bagels, doughnuts, rolls, buns, biscuits, white and   wheat breads, pizza crust and traditional  pasta made of white flour & egg white  - (vegetarian pasta or spinach or wheat pasta is OK).    - Multi-grain bread is OK - like multi-grain flat bread or  sandwich thins.   - Avoid alcohol in excess.   - Exercise is also important.  ============================================================ =============================================================  - So . . . . [name] Sent in a new Rx for Rosuvastatin for Chol & please                                                                              also continue Zetia (Ezetimibe)  - Diet is still very important - Cholesterol is too high                                                                           - Recommend low cholesterol diet   - Cholesterol only comes from animal sources   - ie. meat, dairy, egg yolks  - Eat all the vegetables you want.  - Avoid meat, especially red meat - Beef AND Pork .  - Avoid cheese & dairy - milk & ice cream.      - Cheese is the most concentrated form of trans-fats which                                                             is the worst thing to clog up our arteries.   - Veggie cheese is OK which can be found in the fresh  produce section at [facility] or [employer] or [employer]  ============================================================ ============================================================  - Also for the very elevated Triglycerides (blood fats )   - Please restart YOUR FENOFIBRATE                                                          & low fat diet is also EXTREMELY Important ============================================================ ============================================================  -  Vitamin D = 88 - Excellent  ============================================================ ============================================================  - A1c - Normal - Great - No Diabetes ============================================================ ============================================================  All Else - CBC - Kidneys - Electrolytes - Liver - Magnesium & Thyroid    - all  Normal / OK ===========================================================                        -

## 2020-05-23 DIAGNOSIS — H00024 Hordeolum internum left upper eyelid: Secondary | ICD-10-CM | POA: Diagnosis not present

## 2020-06-10 ENCOUNTER — Other Ambulatory Visit: Payer: Self-pay | Admitting: Adult Health

## 2020-06-10 DIAGNOSIS — I10 Essential (primary) hypertension: Secondary | ICD-10-CM

## 2020-11-12 NOTE — Progress Notes (Deleted)
MEDICARE ANNUAL WELLNESS VISIT AND 87MONTH FOLLOW UP   Assessment:   [name] was seen today for follow-up and medicare wellness.  Diagnoses and all orders for this visit:  Medicare annual wellness visit, subsequent Yearly  Essential hypertension Has lasix PRN Denies any edema Continue to monitor blood pressure  Hyperlipidemia, mixed Statin Myopathy Continue zetia Has tried Crestor, increased myalgias Will check lipids next office visit  Hypothyroidism, unspecified type Taking levothyroxine 47mcg daily Reminder to take on an empty stomach 30-27mins before first meal of the day. No antacid medications for 4 hours.  Vitamin D deficiency Continue supplementation  Abnormal glucose Discussed dietary and exercise modifications  Insomnia, unspecified type Discussed melatonin nightly Decrease stimulation, screen time Exercise regularly  Depression, major in remission  No medicaitons Doing well at this time   Overweight BMI 29.0-29.9,adult Discussed dietary and exercise modifications  GERD Rx Famotadine 20mg  daily or BID  Contack office if not resolved Consider Omeprazole short term treatment? Avoids triggers   Medication management continued   Over 30 minutes of non face to face interview, counseling, chart review and critical decision making was performed Future Appointments  Date Time Provider [place]  11/15/2020  3:30 PM Magda Bernheim, NP [facility] None     Plan:   During the course of the visit the patient was educated and counseled about appropriate screening and preventive services including:   Pneumococcal vaccine  Prevnar 13 Influenza vaccine Td vaccine Screening electrocardiogram Bone densitometry screening Colorectal cancer screening Diabetes screening Glaucoma screening Nutrition counseling  Advanced directives: requested   Subjective:  [name] is a 75 y.o. female who presents for Medicare Annual Wellness Visit  and 3 month follow up for HTN, HLD, hypothyroidism, insomnia, weight and Vitamin D Deficiency.   She reports that she is having abdominal fullness and fluttering at times in epigastric region.  She reports excessive purping after eating.  Reports that at times she feels like a pill or some food will get stuck.    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  She does take some tums occasionally.   [name] Her blood pressure has been controlled she has not checked her blood pressure since going to her daughters house in [place].   She does workout. She denies chest pain, shortness of breath, dizziness.  She is on cholesterol medication and denies myalgias. Her cholesterol is not at goal. The cholesterol last visit was:   Lab Results  Component Value Date   CHOL 181 02/24/2018   HDL 43 (L) 02/24/2018   LDLCALC 98 02/24/2018   TRIG 301 (H) 02/24/2018   CHOLHDL 4.2 02/24/2018   . She has been working on diet and exercise, we monitor for prediabetes, and denies increased appetite, nausea, paresthesia of the feet, polydipsia, polyuria, visual disturbances, vomiting and weight loss. Last A1C in the office was:  Lab Results  Component Value Date   HGBA1C 5.5 05/14/2020   Last GFR:   Lab Results  Component Value Date   GFRNONAA 88 05/14/2020   Lab Results  Component Value Date   GFRAA 102 05/14/2020   Patient is on Vitamin D supplement.   Lab Results  Component Value Date   VD25OH 88 05/14/2020      Medication Review: Current Outpatient Medications on File Prior to Visit  Medication Sig Dispense Refill   Acetaminophen (TYLENOL EXTRA STRENGTH PO) Take by mouth.     aspirin 81 MG tablet Take 81 mg by mouth daily.  Calcium Carbonate (CALCIUM 500 PO) Take 1 tablet by mouth daily.     Cholecalciferol (VITAMIN D PO) Take 2,000 Int'l Units by mouth 2 (two) times daily.      cyclobenzaprine (FLEXERIL) 10 MG tablet Take 1/2 to 1 tablets 2 to 3 x / day if needed for Muscle Spasms or Headaches 90  tablet 3   ezetimibe (ZETIA) 10 MG tablet Take       1 tablet      Daily       for Cholesterol 90 tablet 0   famotidine (PEPCID) 20 MG tablet Take 1 tablet (20 mg total) by mouth daily. 90 tablet 1   fenofibrate (TRICOR) 145 MG tablet Take 1 tablet (145 mg total) by mouth daily. 90 tablet 11   furosemide (LASIX) 40 MG tablet TAKE 1 TABLET TWICE A DAY FOR BP & FLUID 180 tablet 3   gabapentin (NEURONTIN) 300 MG capsule Take 300 mg by mouth at bedtime.     GINKGO BILOBA PLUS PO Take 60 mg by mouth daily.     levothyroxine (SYNTHROID) 75 MCG tablet Take  1 tablet  Daily  on an empty stomach with only water for 30 minutes & no Antacid meds, Calcium or Magnesium for 4 hours & avoid Biotin 90 tablet 03   rosuvastatin (CRESTOR) 10 MG tablet Take  1 tablet  Daily  for Cholesterol 90 tablet 1   TURMERIC PO Take 1,500 mg by mouth daily.     vitamin C (ASCORBIC ACID) 500 MG tablet Take 1,000 mg by mouth daily.      No current facility-administered medications on file prior to visit.    Allergies  Allergen Reactions   Aspirin     REACTION: GI ulcers   Fish Oil     Allergic to fish oil and shell fish   Morphine And Related    Sulfa Antibiotics    Vicodin [Hydrocodone-Acetaminophen] Nausea Only    Current Problems (verified) Patient Active Problem List   Diagnosis Date Noted   Insomnia 05/29/2018   Hypothyroid 04/13/2017   Benign paroxysmal positional vertigo 02/07/2016   BMI 29.0-29.9,adult 09/21/2014   Encounter for Medicare annual wellness exam 09/21/2014   Medication management 08/15/2013   Abnormal glucose 01/26/2013   Vitamin D deficiency 12/13/2012   GERD    Hyperlipidemia, mixed    Depression, major, in remission ([place])    Essential hypertension 09/09/2008    Screening Tests Immunization History  Administered Date(s) Administered   Fluad Quad(high Dose 65+) 10/23/2019   Influenza, High Dose Seasonal PF 10/18/2014, 11/06/2015, 10/22/2017   Influenza-Unspecified 10/13/2016,  10/26/2019   PFIZER(Purple Top)SARS-COV-2 Vaccination 11/13/2019   Pneumococcal Conjugate-13 06/21/2014   Pneumococcal Polysaccharide-23 01/21/2012   Td 01/21/2012    Preventative care: Last colonoscopy: 2010, Had in [facility] 2020 Last mammogram: 2019, Had in [facility] 12/2018 DEXA: 2020  Prior vaccinations: TD or Tdap: 2014  Influenza: 10/2019 Pneumococcal: 2014 Prevnar13: 2016 Shingles/Zostavax: Discussed with patient, Shingrix.    Names of Other Physician/Practitioners you currently use: 1. [facility] here for primary care 2. Eye Exam 2019 3. Dental Exam , Due  Patient Care Team: Unk Pinto, MD as PCP - General (Internal Medicine) Lafayette Dragon, MD (Inactive) as Consulting Physician (Gastroenterology)  SURGICAL HISTORY She  has a past surgical history that includes Back surgery; Appendectomy; Cesarean section; Abdominal hysterectomy (1981); and Salpingoophorectomy (1981 left). FAMILY HISTORY Her family history includes Diabetes in her mother; Heart disease in her father, mother, and  sister; Hypertension in her father; Kidney disease in her mother. SOCIAL HISTORY She  reports that she quit smoking about 12 years ago. She has never used smokeless tobacco. She reports that she does not drink alcohol and does not use drugs.   MEDICARE WELLNESS OBJECTIVES: Physical activity:   Cardiac risk factors:   Depression/mood screen:   Depression screen PHQ 2/9 11/17/2019  Decreased Interest -  Down, Depressed, Hopeless 0  PHQ - 2 Score 0  Altered sleeping -  Tired, decreased energy -  Change in appetite -  Feeling bad or failure about yourself  -  Trouble concentrating -  Moving slowly or fidgety/restless -  PHQ-9 Score -  Difficult doing work/chores -    ADLs:  In your present state of health, do you have any difficulty performing the following activities: 11/17/2019  Hearing? N  Vision? N  Difficulty concentrating or making decisions?  N  Walking or climbing stairs? N  Dressing or bathing? N  Doing errands, shopping? N  Preparing Food and eating ? N  Using the Toilet? N  In the past six months, have you accidently leaked urine? N  Do you have problems with loss of bowel control? N  Managing your Medications? N  Managing your Finances? N  Housekeeping or managing your Housekeeping? N  Some recent data might be hidden     Cognitive Testing  Alert? Yes  Normal Appearance?Yes  Oriented to person? Yes  Place? Yes   Time? Yes  Recall of three objects?  Yes  Can perform simple calculations? Yes  Displays appropriate judgment?Yes  Can read the correct time from a watch face?Yes  EOL planning:    Review of Systems  Constitutional:  Negative for chills, fever and weight loss.  HENT:  Negative for congestion and hearing loss.   Eyes:  Negative for blurred vision and double vision.  Respiratory:  Negative for cough and shortness of breath.   Cardiovascular:  Negative for chest pain, palpitations, orthopnea and leg swelling.  Gastrointestinal:  Negative for abdominal pain, constipation, diarrhea, heartburn, nausea and vomiting.  Musculoskeletal:  Negative for falls, joint pain and myalgias.  Skin:  Negative for rash.  Neurological:  Negative for dizziness, tingling, tremors, loss of consciousness and headaches.  Psychiatric/Behavioral:  Negative for depression, memory loss and suicidal ideas.     Objective:     There were no vitals filed for this visit.  Physical Exam:  There were no vitals taken for this visit.  General Appearance: Well nourished, in no apparent distress. Eyes: PERRLA, EOMs, conjunctiva no swelling or erythema Sinuses: No Frontal/maxillary tenderness ENT/Mouth: Ext aud canals clear, TMs without erythema, bulging. No erythema, swelling, or exudate on post pharynx.  Tonsils not swollen or erythematous. Hearing normal.  Neck: Supple, thyroid normal.  Respiratory: Respiratory effort normal, BS  equal bilaterally without rales, rhonchi, wheezing or stridor.  Cardio: RRR with no MRGs. Brisk peripheral pulses with scant non-pitting edema   Abdomen: Soft, obese/mildly distended, + BS.  Non tender, no guarding, rebound, hernias, masses. Lymphatics: Non tender without lymphadenopathy.  Musculoskeletal: Full ROM, 5/5 strength, normal gait.  Skin: Warm, dry without rashes, lesions; he has fragile skin and numerous small ecchymoses to bilateral upper extremities Neuro: Cranial nerves intact. Normal muscle tone, no cerebellar symptoms. Sensation intact.  Psych: Awake and oriented X 3, normal affect, Insight and Judgment appropriate.    Medicare Attestation I have personally reviewed: The patient's medical and social history Their use of alcohol, tobacco or  illicit drugs Their current medications and supplements The patient's functional ability including ADLs,fall risks, home safety risks, cognitive, and hearing and visual impairment Diet and physical activities Evidence for depression or mood disorders  The patient's weight, height, BMI, and visual acuity have been recorded in the chart.  I have made referrals, counseling, and provided education to the patient based on review of the above and I have provided the patient with a written personalized care plan for preventive services.      Magda Bernheim ANP-C  [facility]  11/12/2020

## 2020-11-15 ENCOUNTER — Ambulatory Visit: Payer: PPO | Admitting: Adult Health Nurse Practitioner

## 2020-11-15 ENCOUNTER — Ambulatory Visit: Payer: PPO | Admitting: Nurse Practitioner

## 2020-11-15 DIAGNOSIS — Z0000 Encounter for general adult medical examination without abnormal findings: Secondary | ICD-10-CM

## 2020-11-15 DIAGNOSIS — E663 Overweight: Secondary | ICD-10-CM

## 2020-11-15 DIAGNOSIS — F325 Major depressive disorder, single episode, in full remission: Secondary | ICD-10-CM

## 2020-11-15 DIAGNOSIS — E559 Vitamin D deficiency, unspecified: Secondary | ICD-10-CM

## 2020-11-15 DIAGNOSIS — G720 Drug-induced myopathy: Secondary | ICD-10-CM

## 2020-11-15 DIAGNOSIS — Z79899 Other long term (current) drug therapy: Secondary | ICD-10-CM

## 2020-11-15 DIAGNOSIS — I10 Essential (primary) hypertension: Secondary | ICD-10-CM

## 2020-11-15 DIAGNOSIS — R7309 Other abnormal glucose: Secondary | ICD-10-CM

## 2020-11-15 DIAGNOSIS — E039 Hypothyroidism, unspecified: Secondary | ICD-10-CM

## 2020-11-15 DIAGNOSIS — K219 Gastro-esophageal reflux disease without esophagitis: Secondary | ICD-10-CM

## 2020-11-15 DIAGNOSIS — G4700 Insomnia, unspecified: Secondary | ICD-10-CM

## 2020-11-15 DIAGNOSIS — E782 Mixed hyperlipidemia: Secondary | ICD-10-CM

## 2020-11-22 NOTE — Progress Notes (Addendum)
MEDICARE ANNUAL WELLNESS VISIT AND 10MONTH FOLLOW UP   Assessment:   [name] was seen today for follow-up and medicare wellness.  Diagnoses and all orders for this visit:  Medicare annual wellness visit, subsequent Yearly  Essential hypertension Has lasix PRN Denies any edema Continue to monitor blood pressure CBC  Hyperlipidemia, mixed Statin Myopathy Continue zetia and fenofibrate Lipid  CMP  Hypothyroidism, unspecified type Taking levothyroxine 3mcg daily Reminder to take on an empty stomach 30-74mins before first meal of the day. No antacid medications for 4 hours. TSH  Vitamin D deficiency Continue supplementation  Abnormal glucose Discussed dietary and exercise modifications  Insomnia, unspecified type Discussed melatonin nightly Decrease stimulation, screen time Exercise regularly  Depression, major in remission  No medicaitons Doing well at this time  Sinus Congestion Try Mucinex and Zyrtec daily for 2 weeks to determine if this helps dry mouth and post nasal drip  Neuropathy Only occurring at nighttime- uses Gabapentin 300 mg at bedtime Is noticing if she gets up in the middle of the night she gets pins and needle sensation and has difficulty falling asleep- using Tylenol , will monitor and determine if we need to change Gabapentin dosage  Obesity Discussed dietary and exercise modifications  GERD Rx Famotadine 20mg  daily or BID  Contack office if not resolved Consider Omeprazole short term treatment? Avoids triggers  Medication management continued  Breast cancer screening Mammogram  Over 30 minutes of non face to face interview, counseling, chart review and critical decision making was performed Future Appointments  Date Time Provider [place]  11/04/2021 10:00 AM Magda Bernheim, NP [facility] None     Plan:   During the course of the visit the patient was educated and counseled about appropriate screening and preventive  services including:   Pneumococcal vaccine  Prevnar 13 Influenza vaccine Td vaccine Screening electrocardiogram Bone densitometry screening Colorectal cancer screening Diabetes screening Glaucoma screening Nutrition counseling  Advanced directives: requested   Subjective:  [name] is a 74 y.o. female who presents for Medicare Annual Wellness Visit and 3 month follow up for HTN, HLD, hypothyroidism, insomnia, weight and Vitamin D Deficiency.   She has been experiencing dry mouth , worse when wakes up in the morning.  Has noted increased sinus congestion and post nasal drip.  Has not tried any over the counter medication.  Has also been using Gabapentin for pins/needles sensation in her feet which occurs only at bedtime.  This was controlling but she now notices if she wakes up at night to go to the bathroom the pain returns and she has difficulty falling back asleep  Has been using Tylenol which is helping.   BMI is Body mass index is 31.4 kg/m., she has been working on diet and exercise. Wt Readings from Last 3 Encounters:  11/26/20 166 lb 3.2 oz (75.4 kg)  05/14/20 168 lb 3.2 oz (76.3 kg)  11/17/19 167 lb (75.8 kg)   Was living with her daughter in [place] from 2020 to last week.  She is again living at [facility] independent living facility.    [name] Her blood pressure has been controlled she has not checked her blood pressure since going to her daughters house in [place]. BP: 130/69 BP Readings from Last 3 Encounters:  11/26/20 130/69  05/14/20 138/74  11/17/19 110/68    She does workout. She denies chest pain, shortness of breath, dizziness.  She is on Zetia 10 mg QD . Had myalgias with Atorvastatin never tried Rosuvastatin-  declines. Her cholesterol is not at goal. The cholesterol last visit was:   Lab Results  Component Value Date   CHOL 181 02/24/2018   HDL 43 (L) 02/24/2018   LDLCALC 98 02/24/2018   TRIG 301 (H) 02/24/2018   CHOLHDL 4.2 02/24/2018   . She  has been working on diet and exercise, we monitor for prediabetes, and denies increased appetite, nausea, paresthesia of the feet, polydipsia, polyuria, visual disturbances, vomiting and weight loss. Last A1C in the office was:  Lab Results  Component Value Date   HGBA1C 5.5 05/14/2020   Last GFR:   Lab Results  Component Value Date   GFRNONAA 88 05/14/2020   Lab Results  Component Value Date   GFRAA 102 05/14/2020   Patient is on Vitamin D supplement.   Lab Results  Component Value Date   VD25OH 88 05/14/2020      Medication Review: Current Outpatient Medications on File Prior to Visit  Medication Sig Dispense Refill   Acetaminophen (TYLENOL EXTRA STRENGTH PO) Take by mouth.     aspirin 81 MG tablet Take 81 mg by mouth daily.     Calcium Carbonate (CALCIUM 500 PO) Take 1 tablet by mouth daily.     Cholecalciferol (VITAMIN D PO) Take 2,000 Int'l Units by mouth 2 (two) times daily.      ezetimibe (ZETIA) 10 MG tablet Take       1 tablet      Daily       for Cholesterol 90 tablet 0   furosemide (LASIX) 40 MG tablet TAKE 1 TABLET TWICE A DAY FOR BP & FLUID 180 tablet 3   gabapentin (NEURONTIN) 300 MG capsule Take 300 mg by mouth at bedtime.     GINKGO BILOBA PLUS PO Take 60 mg by mouth daily.     levothyroxine (SYNTHROID) 75 MCG tablet Take  1 tablet  Daily  on an empty stomach with only water for 30 minutes & no Antacid meds, Calcium or Magnesium for 4 hours & avoid Biotin 90 tablet 03   Magnesium 500 MG TABS Take by mouth.     Multiple Vitamins-Minerals (MULTIVITAMIN WITH MINERALS) tablet Take 1 tablet by mouth daily.     TURMERIC PO Take 1,500 mg by mouth daily.     vitamin C (ASCORBIC ACID) 500 MG tablet Take 1,000 mg by mouth daily.      cyclobenzaprine (FLEXERIL) 10 MG tablet Take 1/2 to 1 tablets 2 to 3 x / day if needed for Muscle Spasms or Headaches (Patient not taking: Reported on 11/26/2020) 90 tablet 3   famotidine (PEPCID) 20 MG tablet Take 1 tablet (20 mg total) by  mouth daily. 90 tablet 1   fenofibrate (TRICOR) 145 MG tablet Take 1 tablet (145 mg total) by mouth daily. 90 tablet 11   rosuvastatin (CRESTOR) 10 MG tablet Take  1 tablet  Daily  for Cholesterol (Patient not taking: Reported on 11/26/2020) 90 tablet 1   No current facility-administered medications on file prior to visit.    Allergies  Allergen Reactions   Aspirin     REACTION: GI ulcers   Fish Oil     Allergic to fish oil and shell fish   Morphine And Related    Sulfa Antibiotics    Vicodin [Hydrocodone-Acetaminophen] Nausea Only    Current Problems (verified) Patient Active Problem List   Diagnosis Date Noted   Insomnia 05/29/2018   Hypothyroid 04/13/2017   Benign paroxysmal positional vertigo 02/07/2016   BMI  29.0-29.9,adult 09/21/2014   Encounter for Medicare annual wellness exam 09/21/2014   Medication management 08/15/2013   Abnormal glucose 01/26/2013   Vitamin D deficiency 12/13/2012   GERD    Hyperlipidemia, mixed    Depression, major, in remission ([place])    Essential hypertension 09/09/2008    Screening Tests Immunization History  Administered Date(s) Administered   Fluad Quad(high Dose 65+) 10/23/2019   Influenza, High Dose Seasonal PF 10/18/2014, 11/06/2015, 10/22/2017   Influenza-Unspecified 10/13/2016, 10/26/2019   PFIZER(Purple Top)SARS-COV-2 Vaccination 05/14/2019, 11/13/2019, 11/13/2019, 07/14/2020, 07/14/2020   Pneumococcal Conjugate-13 06/21/2014   Pneumococcal Polysaccharide-23 01/21/2012   Td 01/21/2012    Preventative care: Last colonoscopy: 2020 in [facility] no further required Last mammogram: 2021 DEXA: 2020  Prior vaccinations: TD or Tdap: 2014  Influenza: 11/26/20 Pneumococcal: 2014 Prevnar13: 2016 Shingles/Zostavax: Discussed with patient, Shingrix.    Names of Other Physician/Practitioners you currently use: 1. [facility] here for primary care 2. Eye Exam 2022 Dr. Nicki Reaper in [facility] 3. Dental Exam , [place] 2022  Patient Care Team: Unk Pinto, MD as PCP - General (Internal Medicine) Lafayette Dragon, MD (Inactive) as Consulting Physician (Gastroenterology)  SURGICAL HISTORY She  has a past surgical history that includes Back surgery; Appendectomy; Cesarean section; Abdominal hysterectomy (1981); and Salpingoophorectomy (1981 left). FAMILY HISTORY Her family history includes Diabetes in her mother; Heart disease in her father, mother, and sister; Hypertension in her father; Kidney disease in her mother. SOCIAL HISTORY She  reports that she quit smoking about 12 years ago. Her smoking use included cigarettes. She has never used smokeless tobacco. She reports that she does not drink alcohol and does not use drugs.   MEDICARE WELLNESS OBJECTIVES: Physical activity: Current Exercise Habits: Home exercise routine, Type of exercise: walking, Time (Minutes): 40, Frequency (Times/Week): 5, Weekly Exercise (Minutes/Week): 200, Intensity: Mild, Exercise limited by: None identified Cardiac risk factors: Cardiac Risk Factors include: advanced age (>8men, >59 women);dyslipidemia;smoking/ tobacco exposure;obesity (BMI >30kg/m2) Depression/mood screen:   Depression screen [facility] 2/9 11/26/2020  Decreased Interest 0  Down, Depressed, Hopeless 0  PHQ - 2 Score 0  Altered sleeping -  Tired, decreased energy -  Change in appetite -  Feeling bad or failure about yourself  -  Trouble concentrating -  Moving slowly or fidgety/restless -  PHQ-9 Score -  Difficult doing work/chores -    ADLs:  In your present state of health, do you have any difficulty performing the following activities: 11/26/2020  Hearing? N  Vision? N  Difficulty concentrating or making decisions? N  Walking or climbing stairs? N  Dressing or bathing? N  Doing errands, shopping? N  Some recent data might be hidden      Cognitive Testing  Alert? Yes  Normal Appearance?Yes  Oriented to  person? Yes  Place? Yes   Time? Yes  Recall of three objects?  Yes  Can perform simple calculations? Yes  Displays appropriate judgment?Yes  Can read the correct time from a watch face?Yes  EOL planning: Does Patient Have a Medical Advance Directive?: No Would patient like information on creating a medical advance directive?: No - Patient declined  Review of Systems  Constitutional:  Negative for chills, fever and weight loss.  HENT:  Positive for congestion. Negative for hearing loss.   Eyes:  Negative for blurred vision and double vision.  Respiratory:  Negative for cough, shortness of breath and wheezing.   Cardiovascular:  Negative for chest pain, palpitations, orthopnea and  leg swelling.  Gastrointestinal:  Negative for abdominal pain, constipation, diarrhea, heartburn, nausea and vomiting.  Genitourinary:  Negative for dysuria.  Musculoskeletal:  Negative for falls, joint pain and myalgias.  Skin:  Negative for rash.  Neurological:  Positive for headaches (sinus related). Negative for dizziness, tingling, tremors and loss of consciousness.  Psychiatric/Behavioral:  Negative for depression, memory loss and suicidal ideas.     Objective:     Today's Vitals   11/26/20 1019  BP: 130/69  Pulse: 93  Temp: (!) 97.5 F (36.4 C)  SpO2: 99%  Weight: 166 lb 3.2 oz (75.4 kg)   Physical Exam:  BP 130/69   Pulse 93   Temp (!) 97.5 F (36.4 C)   Wt 166 lb 3.2 oz (75.4 kg)   SpO2 99%   BMI 31.40 kg/m   General Appearance: Well nourished, in no apparent distress. Eyes: PERRLA, EOMs, conjunctiva no swelling or erythema Sinuses: No Frontal/maxillary tenderness ENT/Mouth: Ext aud canals clear, TMs without erythema, bulging. No erythema, swelling, or exudate on post pharynx.  Tonsils not swollen or erythematous. Hearing normal.  Neck: Supple, thyroid normal.  Respiratory: Respiratory effort normal, BS equal bilaterally without rales, rhonchi, wheezing or stridor.  Cardio: RRR  with no MRGs. Brisk peripheral pulses with scant non-pitting edema   Abdomen: Soft, obese/mildly distended, + BS.  Non tender, no guarding, rebound, hernias, masses. Lymphatics: Non tender without lymphadenopathy.  Musculoskeletal: Full ROM, 5/5 strength, normal gait.  Skin: Warm, dry without rashes, lesions; he has fragile skin and numerous small ecchymoses to bilateral upper extremities Neuro: Cranial nerves intact. Normal muscle tone, no cerebellar symptoms. Sensation intact.  Psych: Awake and oriented X 3, normal affect, Insight and Judgment appropriate.    Medicare Attestation I have personally reviewed: The patient's medical and social history Their use of alcohol, tobacco or illicit drugs Their current medications and supplements The patient's functional ability including ADLs,fall risks, home safety risks, cognitive, and hearing and visual impairment Diet and physical activities Evidence for depression or mood disorders  The patient's weight, height, BMI, and visual acuity have been recorded in the chart.  I have made referrals, counseling, and provided education to the patient based on review of the above and I have provided the patient with a written personalized care plan for preventive services.      Magda Bernheim ANP-C  [facility]  11/26/2020

## 2020-11-26 ENCOUNTER — Encounter: Payer: Self-pay | Admitting: Nurse Practitioner

## 2020-11-26 ENCOUNTER — Ambulatory Visit (INDEPENDENT_AMBULATORY_CARE_PROVIDER_SITE_OTHER): Payer: PPO | Admitting: Nurse Practitioner

## 2020-11-26 ENCOUNTER — Other Ambulatory Visit: Payer: Self-pay

## 2020-11-26 VITALS — BP 130/69 | HR 93 | Temp 97.5°F | Wt 166.2 lb

## 2020-11-26 DIAGNOSIS — E039 Hypothyroidism, unspecified: Secondary | ICD-10-CM | POA: Diagnosis not present

## 2020-11-26 DIAGNOSIS — R0981 Nasal congestion: Secondary | ICD-10-CM | POA: Diagnosis not present

## 2020-11-26 DIAGNOSIS — Z79899 Other long term (current) drug therapy: Secondary | ICD-10-CM

## 2020-11-26 DIAGNOSIS — F325 Major depressive disorder, single episode, in full remission: Secondary | ICD-10-CM

## 2020-11-26 DIAGNOSIS — K219 Gastro-esophageal reflux disease without esophagitis: Secondary | ICD-10-CM | POA: Diagnosis not present

## 2020-11-26 DIAGNOSIS — Z0001 Encounter for general adult medical examination with abnormal findings: Secondary | ICD-10-CM

## 2020-11-26 DIAGNOSIS — R6889 Other general symptoms and signs: Secondary | ICD-10-CM

## 2020-11-26 DIAGNOSIS — T466X5A Adverse effect of antihyperlipidemic and antiarteriosclerotic drugs, initial encounter: Secondary | ICD-10-CM

## 2020-11-26 DIAGNOSIS — G629 Polyneuropathy, unspecified: Secondary | ICD-10-CM

## 2020-11-26 DIAGNOSIS — R7309 Other abnormal glucose: Secondary | ICD-10-CM

## 2020-11-26 DIAGNOSIS — G720 Drug-induced myopathy: Secondary | ICD-10-CM

## 2020-11-26 DIAGNOSIS — Z1231 Encounter for screening mammogram for malignant neoplasm of breast: Secondary | ICD-10-CM

## 2020-11-26 DIAGNOSIS — E782 Mixed hyperlipidemia: Secondary | ICD-10-CM

## 2020-11-26 DIAGNOSIS — E6609 Other obesity due to excess calories: Secondary | ICD-10-CM

## 2020-11-26 DIAGNOSIS — G4700 Insomnia, unspecified: Secondary | ICD-10-CM

## 2020-11-26 DIAGNOSIS — Z6831 Body mass index (BMI) 31.0-31.9, adult: Secondary | ICD-10-CM

## 2020-11-26 DIAGNOSIS — E559 Vitamin D deficiency, unspecified: Secondary | ICD-10-CM

## 2020-11-26 DIAGNOSIS — I10 Essential (primary) hypertension: Secondary | ICD-10-CM

## 2020-11-26 DIAGNOSIS — Z0000 Encounter for general adult medical examination without abnormal findings: Secondary | ICD-10-CM

## 2020-11-26 NOTE — Addendum Note (Signed)
Addended by: Magda Bernheim on: 11/26/2020 12:37 PM   Modules accepted: Orders

## 2020-11-26 NOTE — Patient Instructions (Signed)

## 2020-11-27 LAB — COMPLETE METABOLIC PANEL WITH GFR
AG Ratio: 1.8 (calc) (ref 1.0–2.5)
ALT: 19 U/L (ref 6–29)
AST: 26 U/L (ref 10–35)
Albumin: 4.7 g/dL (ref 3.6–5.1)
Alkaline phosphatase (APISO): 50 U/L (ref 37–153)
BUN: 17 mg/dL (ref 7–25)
CO2: 32 mmol/L (ref 20–32)
Calcium: 10.3 mg/dL (ref 8.6–10.4)
Chloride: 104 mmol/L (ref 98–110)
Creat: 0.84 mg/dL (ref 0.60–1.00)
Globulin: 2.6 g/dL (calc) (ref 1.9–3.7)
Glucose, Bld: 100 mg/dL — ABNORMAL HIGH (ref 65–99)
Potassium: 4.4 mmol/L (ref 3.5–5.3)
Sodium: 144 mmol/L (ref 135–146)
Total Bilirubin: 0.4 mg/dL (ref 0.2–1.2)
Total Protein: 7.3 g/dL (ref 6.1–8.1)
eGFR: 72 mL/min/{1.73_m2} (ref >=60)

## 2020-11-27 LAB — CBC WITH DIFFERENTIAL/PLATELET
Absolute Monocytes: 403 cells/uL (ref 200–950)
Basophils Absolute: 38 cells/uL (ref 0–200)
Basophils Relative: 0.6 %
Eosinophils Absolute: 113 cells/uL (ref 15–500)
Eosinophils Relative: 1.8 %
HCT: 39.8 % (ref 35.0–45.0)
Hemoglobin: 13.4 g/dL (ref 11.7–15.5)
Lymphs Abs: 1418 cells/uL (ref 850–3900)
MCH: 30.6 pg (ref 27.0–33.0)
MCHC: 33.7 g/dL (ref 32.0–36.0)
MCV: 90.9 fL (ref 80.0–100.0)
MPV: 11.1 fL (ref 7.5–12.5)
Monocytes Relative: 6.4 %
Neutro Abs: 4328 cells/uL (ref 1500–7800)
Neutrophils Relative %: 68.7 %
Platelets: 343 10*3/uL (ref 140–400)
RBC: 4.38 10*6/uL (ref 3.80–5.10)
RDW: 12.3 % (ref 11.0–15.0)
Total Lymphocyte: 22.5 %
WBC: 6.3 10*3/uL (ref 3.8–10.8)

## 2020-11-27 LAB — LIPID PANEL
Cholesterol: 204 mg/dL — ABNORMAL HIGH (ref <200)
HDL: 45 mg/dL — ABNORMAL LOW (ref >=50)
LDL Cholesterol (Calc): 130 mg/dL (calc) — ABNORMAL HIGH
Non-HDL Cholesterol (Calc): 159 mg/dL (calc) — ABNORMAL HIGH (ref <130)
Total CHOL/HDL Ratio: 4.5 (calc) (ref <5.0)
Triglycerides: 169 mg/dL — ABNORMAL HIGH (ref <150)

## 2020-11-27 LAB — TSH: TSH: 0.63 mIU/L (ref 0.40–4.50)

## 2020-11-30 ENCOUNTER — Encounter: Payer: Self-pay | Admitting: Nurse Practitioner

## 2020-12-03 ENCOUNTER — Other Ambulatory Visit: Payer: Self-pay | Admitting: Nurse Practitioner

## 2020-12-03 DIAGNOSIS — J069 Acute upper respiratory infection, unspecified: Secondary | ICD-10-CM

## 2020-12-03 MED ORDER — AZITHROMYCIN 250 MG PO TABS: ORAL_TABLET | ORAL | 1 refills | Status: DC

## 2020-12-13 ENCOUNTER — Telehealth: Payer: Self-pay

## 2020-12-13 ENCOUNTER — Other Ambulatory Visit: Payer: Self-pay | Admitting: Nurse Practitioner

## 2020-12-13 DIAGNOSIS — R051 Acute cough: Secondary | ICD-10-CM

## 2020-12-13 NOTE — Telephone Encounter (Signed)
I will send an order for a chest xray to [address]. Have pt get tomorrow

## 2020-12-13 NOTE — Telephone Encounter (Signed)
Patient said her cough isn't any better. What can she do now?   [facility]

## 2020-12-14 ENCOUNTER — Other Ambulatory Visit: Payer: Self-pay

## 2020-12-14 ENCOUNTER — Ambulatory Visit
Admission: RE | Admit: 2020-12-14 | Discharge: 2020-12-14 | Disposition: A | Discharge status: Home / Self Care | Payer: PPO | Source: Ambulatory Visit | Attending: Nurse Practitioner | Admitting: Nurse Practitioner

## 2020-12-14 DIAGNOSIS — R059 Cough, unspecified: Secondary | ICD-10-CM | POA: Diagnosis not present

## 2020-12-14 DIAGNOSIS — R0602 Shortness of breath: Secondary | ICD-10-CM | POA: Diagnosis not present

## 2020-12-14 DIAGNOSIS — R051 Acute cough: Secondary | ICD-10-CM

## 2020-12-17 NOTE — Progress Notes (Signed)
Assessment and Plan: [name] was seen today for follow-up.  Diagnoses and all orders for this visit:  Bronchitis, allergic, unspecified asthma severity, uncomplicated Use Allegra daily for at least 2 weeks, push fluids, monitor symptoms -     albuterol (VENTOLIN HFA) 108 (90 Base) MCG/ACT inhaler; Inhale 2 puffs into the lungs every 6 (six) hours as needed for wheezing or shortness of breath. -     predniSONE (DELTASONE) 20 MG tablet; 2 tablets daily with food for 3 days, 1 tab a day for 5 days.       Further disposition pending results of labs. Discussed med's effects and SE's.   Over 30 minutes of exam, counseling, chart review, and critical decision making was performed.   Future Appointments  Date Time Provider [place]  01/02/2021 12:40 PM [facility]  03/21/2021 11:00 AM Unk Pinto, MD [facility] None  11/04/2021 10:00 AM Magda Bernheim, NP [facility] None    ------------------------------------------------------------------------------------------------------------------   HPI BP 130/68   Pulse 70   Temp (!) 97.5 F (36.4 C)   Wt 166 lb (75.3 kg)   SpO2 96%   BMI 31.37 kg/m  75 y.o.female presents for Persistent dry cough  The cough is dry and is occurring mostly at night. Does note sinus congestion unable to get drainage when she blows her nose.  Difficulty taking a deep breath. She does have a frontal headache. Denies fever, muscle aching, nausea, vomiting and diarrhea    Past Medical History:  Diagnosis Date   Vitamin D deficiency      Allergies  Allergen Reactions   Aspirin     REACTION: GI ulcers   Fish Oil     Allergic to fish oil and shell fish   Morphine And Related    Sulfa Antibiotics    Vicodin [Hydrocodone-Acetaminophen] Nausea Only    Current Outpatient Medications on File Prior to Visit  Medication Sig   Acetaminophen (TYLENOL EXTRA STRENGTH PO) Take by mouth.   aspirin 81 MG tablet Take 81 mg by mouth  daily.   azithromycin (ZITHROMAX) 250 MG tablet Take 2 tablets (500 mg) on  Day 1,  followed by 1 tablet (250 mg) once daily on Days 2 through 5.   Calcium Carbonate (CALCIUM 500 PO) Take 1 tablet by mouth daily.   Cholecalciferol (VITAMIN D PO) Take 2,000 Int'l Units by mouth 2 (two) times daily.    cyclobenzaprine (FLEXERIL) 10 MG tablet Take 1/2 to 1 tablets 2 to 3 x / day if needed for Muscle Spasms or Headaches   ezetimibe (ZETIA) 10 MG tablet Take       1 tablet      Daily       for Cholesterol   furosemide (LASIX) 40 MG tablet TAKE 1 TABLET TWICE A DAY FOR BP & FLUID   gabapentin (NEURONTIN) 300 MG capsule Take 300 mg by mouth at bedtime.   GINKGO BILOBA PLUS PO Take 60 mg by mouth daily.   levothyroxine (SYNTHROID) 75 MCG tablet Take  1 tablet  Daily  on an empty stomach with only water for 30 minutes & no Antacid meds, Calcium or Magnesium for 4 hours & avoid Biotin   Magnesium 500 MG TABS Take by mouth.   Multiple Vitamins-Minerals (MULTIVITAMIN WITH MINERALS) tablet Take 1 tablet by mouth daily.   rosuvastatin (CRESTOR) 10 MG tablet Take  1 tablet  Daily  for Cholesterol   TURMERIC PO Take 1,500 mg by mouth daily.  vitamin C (ASCORBIC ACID) 500 MG tablet Take 1,000 mg by mouth daily.    famotidine (PEPCID) 20 MG tablet Take 1 tablet (20 mg total) by mouth daily.   fenofibrate (TRICOR) 145 MG tablet Take 1 tablet (145 mg total) by mouth daily.   No current facility-administered medications on file prior to visit.    ROS: all negative except above.   Physical Exam:  BP 130/68   Pulse 70   Temp (!) 97.5 F (36.4 C)   Wt 166 lb (75.3 kg)   SpO2 96%   BMI 31.37 kg/m   General Appearance: Well nourished, in no apparent distress. Eyes: PERRLA, EOMs, conjunctiva no swelling or erythema Sinuses: No Frontal/maxillary tenderness ENT/Mouth: Ext aud canals clear, TMs without erythema, bulging. No erythema, swelling, or exudate on post pharynx.  Tonsils not swollen or erythematous.  Hearing normal.  Neck: Supple, thyroid normal.  Respiratory: Respiratory effort normal, BS equal bilaterally without rales, rhonchi , expiratory wheezing in upper lobe bilaterally  Cardio: RRR with no MRGs. Brisk peripheral pulses without edema.  Abdomen: Soft, + BS.  Non tender, no guarding, rebound, hernias, masses. Lymphatics: Non tender without lymphadenopathy.  Musculoskeletal: Full ROM, 5/5 strength, normal gait.  Skin: Warm, dry without rashes, lesions, ecchymosis.  Neuro: Cranial nerves intact. Normal muscle tone, no cerebellar symptoms. Sensation intact.  Psych: Awake and oriented X 3, normal affect, Insight and Judgment appropriate.     Magda Bernheim, NP 1:50 PM [facility]

## 2020-12-18 ENCOUNTER — Other Ambulatory Visit: Payer: Self-pay

## 2020-12-18 ENCOUNTER — Encounter: Payer: Self-pay | Admitting: Nurse Practitioner

## 2020-12-18 ENCOUNTER — Ambulatory Visit (INDEPENDENT_AMBULATORY_CARE_PROVIDER_SITE_OTHER): Payer: PPO | Admitting: Nurse Practitioner

## 2020-12-18 VITALS — BP 130/68 | HR 70 | Temp 97.5°F | Wt 166.0 lb

## 2020-12-18 DIAGNOSIS — J45909 Unspecified asthma, uncomplicated: Secondary | ICD-10-CM | POA: Diagnosis not present

## 2020-12-18 MED ORDER — ALBUTEROL SULFATE HFA 108 (90 BASE) MCG/ACT IN AERS: 2.0000 | INHALATION_SPRAY | Freq: Four times a day (QID) | RESPIRATORY_TRACT | 2 refills | Status: DC | PRN

## 2020-12-18 MED ORDER — PREDNISONE 20 MG PO TABS: ORAL_TABLET | ORAL | 0 refills | Status: DC

## 2020-12-18 MED ORDER — PREDNISONE 20 MG PO TABS: ORAL_TABLET | ORAL | 0 refills | Status: AC

## 2020-12-18 NOTE — Patient Instructions (Signed)
Use Allegra daily for 2 weeks Use Albuterol inhaler 2 puffs twice a day for 5 days Complete prednisone course  Albuterol Metered Dose Inhaler (MDI) What is this medication? ALBUTEROL (al [place]) treats lung diseases, such as asthma, where the airways in the lungs narrow, causing breathing problems or wheezing (bronchospasm). It is also used to treat asthma or prevent breathing problems during exercise. This medication works by opening the airways of the lungs, making it easier to breathe. It is often called a rescue- or quick-relief inhaler. This medicine may be used for other purposes; ask your health care provider or pharmacist if you have questions. COMMON BRAND NAME(S): Proair HFA, Proventil, Proventil HFA, Respirol, Ventolin, Ventolin HFA What should I tell my care team before I take this medication? They need to know if you have any of these conditions: Diabetes (high blood sugar) Heart disease High blood pressure Irregular heartbeat or rhythm Pheochromocytoma Seizures Thyroid disease An unusual or allergic reaction to albuterol, other medications, foods, dyes, or preservatives Pregnant or trying to get pregnant Breast-feeding How should I use this medication? This medication is inhaled through the mouth. Take it as directed on the prescription label. Do not use it more often than directed. This medication comes with INSTRUCTIONS FOR USE. Ask your pharmacist for directions on how to use this medication. Read the information carefully. Talk to your pharmacist or care team if you have questions. Talk to your care team about the use of this medication in children. While it may be given to children for selected conditions, precautions do apply. Overdosage: If you think you have taken too much of this medicine contact a poison control center or emergency room at once. NOTE: This medicine is only for you. Do not share this medicine with others. What if I miss a dose? If you take  this medication on a regular basis, take it as soon as you can. If it is almost time for your next dose, take only that dose. Do not take double or extra doses. What may interact with this medication? Caffeine Chloroquine Cisapride Diuretics Medications for colds Medications for depression or for emotional or psychotic conditions Medications for weight loss including some herbal products Methadone Pentamidine Some antibiotics like clarithromycin, erythromycin, levofloxacin, and linezolid Some heart medications Steroid hormones like dexamethasone, cortisone, hydrocortisone Theophylline Thyroid hormones This list may not describe all possible interactions. Give your health care provider a list of all the medicines, herbs, non-prescription drugs, or dietary supplements you use. Also tell them if you smoke, drink alcohol, or use illegal drugs. Some items may interact with your medicine. What should I watch for while using this medication? Visit your care team for regular checks on your progress. Tell your care team if your symptoms do not start to get better or if they get worse. If your symptoms get worse or if you are using this medication more than normal, call your care team right away. Do not treat yourself for coughs, colds or allergies without asking your care team for advice. Some nonprescription medications can affect this one. You and your care team should develop an Asthma Action Plan that is just for you. Be sure to know what to do if you are in the yellow (asthma is getting worse) or red (medical alert) zones. Your mouth may get dry. Chewing sugarless gum or sucking hard candy and drinking plenty of water may help. Contact your health care provider if the problem does not go away or is severe.  What side effects may I notice from receiving this medication? Side effects that you should report to your care team as soon as possible: Allergic reactions--skin rash, itching, hives,  swelling of the face, lips, tongue, or throat Heart rhythm changes--fast or irregular heartbeat, dizziness, feeling faint or lightheaded, chest pain, trouble breathing Increase in blood pressure Muscle pain or cramps Wheezing or trouble breathing that is worse after use Side effects that usually do not require medical attention (report to your care team if they continue or are bothersome): Change in taste Dry mouth Headache Sore throat Tremors or shaking Trouble sleeping This list may not describe all possible side effects. Call your doctor for medical advice about side effects. You may report side effects to FDA at 1-800-FDA-1088. Where should I keep my medication? Keep out of the reach of children and pets. Store at room temperature between 20 and 25 degrees C (68 and 77 degrees F). Keep inhaler away from extreme heat and cold. Get rid of it when the dose counter reads 0 or after the expiration date, whichever is first. To get rid of medications that are no longer needed or have expired: Take the medication to a medication take-back program. Check with your pharmacy or law enforcement to find a location. If you cannot return the medication, ask your care team how to get rid of this medication safely. NOTE: This sheet is a summary. It may not cover all possible information. If you have questions about this medicine, talk to your doctor, pharmacist, or health care provider.  2022 Elsevier/Gold Standard (2019-12-09 00:00:00)

## 2021-01-02 ENCOUNTER — Ambulatory Visit
Admission: RE | Admit: 2021-01-02 | Discharge: 2021-01-02 | Disposition: A | Discharge status: Home / Self Care | Payer: PPO | Source: Ambulatory Visit | Attending: Nurse Practitioner | Admitting: Nurse Practitioner

## 2021-01-02 DIAGNOSIS — Z1231 Encounter for screening mammogram for malignant neoplasm of breast: Secondary | ICD-10-CM

## 2021-01-30 ENCOUNTER — Other Ambulatory Visit: Payer: Self-pay | Admitting: Adult Health Nurse Practitioner

## 2021-01-30 DIAGNOSIS — E782 Mixed hyperlipidemia: Secondary | ICD-10-CM

## 2021-02-10 ENCOUNTER — Encounter (HOSPITAL_BASED_OUTPATIENT_CLINIC_OR_DEPARTMENT_OTHER): Payer: Self-pay | Admitting: Emergency Medicine

## 2021-02-10 ENCOUNTER — Emergency Department (HOSPITAL_BASED_OUTPATIENT_CLINIC_OR_DEPARTMENT_OTHER)
Admission: EM | Admit: 2021-02-10 | Discharge: 2021-02-10 | Disposition: A | Discharge status: Home / Self Care | Payer: PPO | Attending: Emergency Medicine | Admitting: Emergency Medicine

## 2021-02-10 ENCOUNTER — Other Ambulatory Visit: Payer: Self-pay

## 2021-02-10 DIAGNOSIS — Z79899 Other long term (current) drug therapy: Secondary | ICD-10-CM | POA: Diagnosis not present

## 2021-02-10 DIAGNOSIS — H938X3 Other specified disorders of ear, bilateral: Secondary | ICD-10-CM | POA: Diagnosis present

## 2021-02-10 DIAGNOSIS — Z7982 Long term (current) use of aspirin: Secondary | ICD-10-CM | POA: Insufficient documentation

## 2021-02-10 DIAGNOSIS — H6123 Impacted cerumen, bilateral: Secondary | ICD-10-CM | POA: Diagnosis not present

## 2021-02-10 NOTE — ED Triage Notes (Signed)
Bilateral ear fullness/congestion X 1.5 hours. No travel.

## 2021-02-10 NOTE — Discharge Instructions (Addendum)
We successfully cleared out the earwax that was clogging your ears today.  I would recommend using a solution called Debrox that you can get over-the-counter to clean your ears about once a week to prevent from this buildup happening again.  If you have significant worsening of hearing, please follow-up with your primary care doctor.

## 2021-02-10 NOTE — ED Notes (Signed)
Last 2 hours has been having feeling of bilateral "ear fullness". Was eating when it started, not the first time this has occurred and last time needed to be "flushed". Described it as being in a airplane in flight and wont pop. Denied anything else.

## 2021-02-10 NOTE — ED Provider Notes (Signed)
[facility] DEPT Provider Note   CSN: [id number] Arrival date & time: 02/10/21  1329     History  Chief Complaint  Patient presents with   Ear Fullness    [name] is a 76 y.o. female who presents to the ED for evaluation of bilateral ear fullness that is worse on the left side.  Patient was at lunch with her son and daughter-in-law and after leaving, she suddenly felt like her hearing was very muffled.  She denies any pain.  No recent travel.  She denies all other complaints and symptoms of illness including fever, chills, cough congestion and sore throat.  She notes that this is happened before and has had a buildup of earwax.   Ear Fullness Pertinent negatives include no abdominal pain, no headaches and no shortness of breath.      Home Medications Prior to Admission medications   Medication Sig Start Date End Date Taking? Authorizing Provider  Acetaminophen (TYLENOL EXTRA STRENGTH PO) Take by mouth.    [provider]  albuterol (VENTOLIN HFA) 108 (90 Base) MCG/ACT inhaler Inhale 2 puffs into the lungs every 6 (six) hours as needed for wheezing or shortness of breath. 12/18/20   Magda Bernheim, NP  aspirin 81 MG tablet Take 81 mg by mouth daily.    [provider]  azithromycin (ZITHROMAX) 250 MG tablet Take 2 tablets (500 mg) on  Day 1,  followed by 1 tablet (250 mg) once daily on Days 2 through 5. 12/03/20   Magda Bernheim, NP  Calcium Carbonate (CALCIUM 500 PO) Take 1 tablet by mouth daily.    [provider]  Cholecalciferol (VITAMIN D PO) Take 2,000 Int'l Units by mouth 2 (two) times daily.     [provider]  cyclobenzaprine (FLEXERIL) 10 MG tablet Take 1/2 to 1 tablets 2 to 3 x / day if needed for Muscle Spasms or Headaches 01/05/17   Unk Pinto, MD  ezetimibe (ZETIA) 10 MG tablet Take       1 tablet      Daily       for Cholesterol 11/29/19   Unk Pinto, MD  famotidine (PEPCID) 20 MG tablet Take  1 tablet (20 mg total) by mouth daily. 11/17/19 11/16/20  Garnet Sierras, NP  fenofibrate (TRICOR) 145 MG tablet Take 1 tablet Daily for Triglycerides (Blood Fats ) 01/30/21   Unk Pinto, MD  furosemide (LASIX) 40 MG tablet TAKE 1 TABLET TWICE A DAY FOR BP & FLUID 06/10/20   Unk Pinto, MD  gabapentin (NEURONTIN) 300 MG capsule Take 300 mg by mouth at bedtime.    [provider]  GINKGO BILOBA PLUS PO Take 60 mg by mouth daily.    [provider]  levothyroxine (SYNTHROID) 75 MCG tablet Take  1 tablet  Daily  on an empty stomach with only water for 30 minutes & no Antacid meds, Calcium or Magnesium for 4 hours & avoid Biotin 04/19/20   Unk Pinto, MD  Magnesium 500 MG TABS Take by mouth.    [provider]  Multiple Vitamins-Minerals (MULTIVITAMIN WITH MINERALS) tablet Take 1 tablet by mouth daily.    [provider]  rosuvastatin (CRESTOR) 10 MG tablet Take  1 tablet  Daily  for Cholesterol 05/15/20   Unk Pinto, MD  TURMERIC PO Take 1,500 mg by mouth daily.    [provider]  vitamin C (ASCORBIC ACID) 500 MG tablet Take 1,000 mg by mouth daily.  [provider]      Allergies    Aspirin, Fish oil, Morphine and related, Sulfa antibiotics, and Vicodin [hydrocodone-acetaminophen]    Review of Systems   Review of Systems  Constitutional:  Negative for fever.  HENT:  Positive for hearing loss.   Eyes: Negative.   Respiratory:  Negative for shortness of breath.   Cardiovascular: Negative.   Gastrointestinal:  Negative for abdominal pain and vomiting.  Endocrine: Negative.   Genitourinary: Negative.   Musculoskeletal: Negative.   Skin:  Negative for rash.  Neurological:  Negative for headaches.  All other systems reviewed and are negative.  Physical Exam Updated Vital Signs BP (!) 176/69 (BP Location: Right Arm)    Pulse 67    Temp (!) 97.5 F (36.4 C) (Oral)    Resp 18    Ht 5\' 1"  (1.549 m)    Wt 75.3 kg     SpO2 100%    BMI 31.37 kg/m  Physical Exam Vitals and nursing note reviewed.  Constitutional:      General: She is not in acute distress.    Appearance: She is not ill-appearing.  HENT:     Head: Atraumatic.     Ears:     Comments: Significant cerumen burden bilaterally.  TM not visible. Eyes:     Conjunctiva/sclera: Conjunctivae normal.  Cardiovascular:     Rate and Rhythm: Normal rate and regular rhythm.     Pulses: Normal pulses.     Heart sounds: No murmur heard. Pulmonary:     Effort: Pulmonary effort is normal. No respiratory distress.     Breath sounds: Normal breath sounds.  Abdominal:     General: Abdomen is flat. There is no distension.     Palpations: Abdomen is soft.     Tenderness: There is no abdominal tenderness.  Musculoskeletal:        General: Normal range of motion.     Cervical back: Normal range of motion.  Skin:    General: Skin is warm and dry.     Capillary Refill: Capillary refill takes less than 2 seconds.  Neurological:     General: No focal deficit present.     Mental Status: She is alert.  Psychiatric:        Mood and Affect: Mood normal.    ED Results / Procedures / Treatments   Labs (all labs ordered are listed, but only abnormal results are displayed) Labs Reviewed - No data to display  EKG None  Radiology No results found.  Procedures .Ear Cerumen Removal  Date/Time: 02/10/2021 4:57 PM Performed by: Tonye Pearson, PA-C Authorized by: Tonye Pearson, PA-C   Consent:    Consent obtained:  Verbal   Consent given by:  Patient   Risks, benefits, and alternatives were discussed: yes     Risks discussed:  TM perforation and incomplete removal   Alternatives discussed:  No treatment Universal protocol:    Procedure explained and questions answered to patient or proxy's satisfaction: yes     Relevant documents present and verified: yes     Immediately prior to procedure, a time out was called: yes     Patient identity  confirmed:  Verbally with patient Procedure details:    Location:  L ear and R ear   Procedure type: irrigation     Procedure outcomes: cerumen removed   Post-procedure details:    Inspection:  Some cerumen remaining, no bleeding and TM intact   Hearing quality:  Normal  Procedure completion:  Tolerated    Medications Ordered in ED Medications - No data to display  ED Course/ Medical Decision Making/ A&P                           Medical Decision Making  History:  Per HPI  Initial impression:  This patient presents to the ED for concern of muffled hearing, this involves an extensive number of treatment options, and is a complaint that carries with it a high risk of complications and morbidity.    Patient bilateral ears have significant cerumen burden without visible tympanic membrane.  Symptoms are likely caused from wax buildup.  Will attempt to remove earwax here in the ED.  ED Course: Earwax successfully removed bilaterally with irrigation and curette.  Patient has resolution of hearing and discomfort.  Tympanic membrane's clear bilaterally.  No further intervention required.   Disposition:  After consideration of the diagnostic results, physical exam, history and the patients response to treatment feel that the patent would benefit from discharge with outpatient follow-up.   Cerumen impaction bilateral: Discussed use of Debrox at home once a week to prevent buildup of earwax.  She is to follow-up with her PCP as needed.  All questions were asked and answered.  Discharged home in good condition.   Final Clinical Impression(s) / ED Diagnoses Final diagnoses:  Bilateral impacted cerumen    Rx / DC Orders ED Discharge Orders     None         Tonye Pearson, [place] 02/10/21 1658    Wyvonnia Dusky, MD 02/10/21 [phone]

## 2021-03-21 ENCOUNTER — Ambulatory Visit (INDEPENDENT_AMBULATORY_CARE_PROVIDER_SITE_OTHER): Payer: PPO | Admitting: Internal Medicine

## 2021-03-21 ENCOUNTER — Other Ambulatory Visit: Payer: Self-pay

## 2021-03-21 ENCOUNTER — Encounter: Payer: Self-pay | Admitting: Internal Medicine

## 2021-03-21 VITALS — BP 120/70 | HR 80 | Temp 97.9°F | Resp 16 | Ht 61.0 in | Wt 167.6 lb

## 2021-03-21 DIAGNOSIS — E782 Mixed hyperlipidemia: Secondary | ICD-10-CM | POA: Diagnosis not present

## 2021-03-21 DIAGNOSIS — E039 Hypothyroidism, unspecified: Secondary | ICD-10-CM | POA: Diagnosis not present

## 2021-03-21 DIAGNOSIS — Z0000 Encounter for general adult medical examination without abnormal findings: Secondary | ICD-10-CM | POA: Diagnosis not present

## 2021-03-21 DIAGNOSIS — E559 Vitamin D deficiency, unspecified: Secondary | ICD-10-CM | POA: Diagnosis not present

## 2021-03-21 DIAGNOSIS — Z136 Encounter for screening for cardiovascular disorders: Secondary | ICD-10-CM

## 2021-03-21 DIAGNOSIS — R7309 Other abnormal glucose: Secondary | ICD-10-CM | POA: Diagnosis not present

## 2021-03-21 DIAGNOSIS — I10 Essential (primary) hypertension: Secondary | ICD-10-CM | POA: Diagnosis not present

## 2021-03-21 DIAGNOSIS — Z79899 Other long term (current) drug therapy: Secondary | ICD-10-CM | POA: Diagnosis not present

## 2021-03-21 NOTE — Patient Instructions (Signed)

## 2021-03-21 NOTE — Progress Notes (Signed)
? ?Annual  Screening/Preventative Visit  ?& Comprehensive Evaluation & Examination ? ?Future Appointments  ?Date Time Provider Department  ?03/21/2021 11:00 AM Unk Pinto, MD [facility]  ?11/04/2021 10:00 AM Magda Bernheim, NP [facility]  ?03/27/2022 11:00 AM Unk Pinto, MD [facility]  ? ?    ?     This very nice 76 y.o. WWF presents for a Screening /Preventative Visit & comprehensive evaluation and management of multiple medical co-morbidities.  Patient has been followed for HTN, HLD, Prediabetes and Vitamin D Deficiency.  Patient has moved to [place] resides with a daughter & alternates living  q14mowith a son living locally. Today she relates that she has moved back to [place]  ? ? ?    HTN predates since 1995. Patient's BP has been controlled and today's BP is at goal - 120/70. Patient denies any cardiac symptoms as chest pain, palpitations, shortness of breath, dizziness or ankle swelling. ? ? ?    Patient's hyperlipidemia is controlled with diet  diet and Ezetimibe /Fenofibrate as she has been off of her Rosuvastatin.   Patient denies myalgias or other medication SE's. Last lipids were not at goal : ? ?Lab Results  ?Component Value Date  ? CHOL 204 (H) 11/26/2020  ? HDL 45 (L) 11/26/2020  ? LWelch130 (H) 11/26/2020  ? TRIG 169 (H) 11/26/2020  ? CHOLHDL 4.5 11/26/2020  ? ? ? ?    Patient has hx/o prediabetes (A1c 5.7% /2011) and patient denies reactive hypoglycemic symptoms, visual blurring, diabetic polys or paresthesias. Last A1c was normal & at goal : ?  ?Lab Results  ?Component Value Date  ? HGBA1C 5.5 05/14/2020  ?  ? ?      ?                                                    In 2009,  patient was dx'd Hypothyroid  & has been on thyroid replacement since.     ?  ? ?    Finally, patient has history of Vitamin D Deficiency ("40"/2008) and last vitamin D was at goal : ?  ?Lab Results  ?Component Value Date  ? VD25OH 88 05/14/2020  ? ? ? ?Current Outpatient Medications on File Prior to  Visit  ?Medication Sig  ? Acetaminophen EXTRA STRENGTH  Take   ? albuterol HFA inhaler Inhale 2 puffs every 6  hours as needed   ? aspirin 81 MG tablet Take daily.  ? CALCIUM 500  Take 1 tablet  daily.  ? VITAMIN D 2,000 Units  Take 2 times daily.   ? Cyclobenzaprine 10 MG tablet Take 1/2 to 1 tablets 2 to 3 x / day if needed   ? ezetimibe 10 MG tablet Take  1 tablet  Daily  ? famotidine  20 MG tablet Take 1 tablet daily.  ? fenofibrate 145 MG tablet Take 1 tablet Daily   ? furosemide 40 MG tablet TAKE 1 TABLET TWICE A DAY   ? gabapentin  300 MG capsule Take at bedtime.  ? GINKGO BILOBA PLUS  60 mg  Take daily.  ? levothyroxine 75 MCG tablet Take  1 tablet  Daily    ? Magnesium 500 MG TABS Take   ? Multiple Vitamins-Minerals  Take 1 tablet daily.  ?  -  NOT TAKING   ?  TURMERIC 1,500 mg Take daily.  ? vitamin C  500 MG tablet Take 1,000 mg  daily.   ? ? ? ?Allergies  ?Allergen Reactions  ? Aspirin   ?  REACTION: GI ulcers  ? Fish Oil   ?  Allergic to fish oil and shell fish  ? Morphine And Related   ? Sulfa Antibiotics   ? Vicodin [Hydrocodone] Nausea Only  ? ? ? ?Past Medical History:  ?Diagnosis Date  ? Vitamin D deficiency   ? ? ? ?Health Maintenance  ?Topic Date Due  ? Zoster Vaccines- Shingrix (1 of 2) Never done  ? INFLUENZA VACCINE  08/06/2020  ? COVID-19 Vaccine (4 - Booster for Pfizer series) 09/08/2020  ? TETANUS/TDAP  01/20/2022  ? Pneumonia Vaccine 83+ Years old  Completed  ? DEXA SCAN  Completed  ? Hepatitis C Screening  Completed  ? HPV VACCINES  Aged Out  ? ? ? ?Immunization History  ?Administered Date(s) Administered  ? Fluad Quad(high Dose ) 10/23/2019  ? Influenza, High Dose S 10/18/2014, 11/06/2015, 10/22/2017  ? Influenza-Unspecified 10/13/2016, 10/26/2019  ? PFIZER SARS-COV-2 Vacc 11/13/2019, 07/14/2020, 07/14/2020  ? Pneumococcal -13 06/21/2014  ? Pneumococcal -23 01/21/2012  ? Td 01/21/2012  ? ? ?Last Colon - 2020 in [place] with 4 polyps. ? ? ?Past Surgical History:  ?Procedure  Laterality Date  ? ABDOMINAL HYSTERECTOMY  1981  ? partial  ? APPENDECTOMY    ? BACK SURGERY    ? CESAREAN SECTION    ? SALPINGOOPHORECTOMY  1981 left  ? 1982 right  ? ? ? ?Family History  ?Problem Relation Age of Onset  ? Diabetes Mother   ? Kidney disease Mother   ? Heart disease Mother   ? Hypertension Father   ? Heart disease Father   ? Heart disease Sister   ? ? ? ?Social History  ? ?Tobacco Use  ? Smoking status: Former  ?  Types: Cigarettes  ?  Quit date: 01/07/2008  ?  Years since quitting: 13.2  ? Smokeless tobacco: Never  ?Substance Use Topics  ? Alcohol use: No  ? Drug use: No  ? ? ? ? ROS ?Constitutional: Denies fever, chills, weight loss/gain, headaches, insomnia,  night sweats or change in appetite. Does c/o fatigue. ?Eyes: Denies redness, blurred vision, diplopia, discharge, itchy or watery eyes.  ?ENT: Denies discharge, congestion, post nasal drip, epistaxis, sore throat, earache, hearing loss, dental pain, Tinnitus, Vertigo, Sinus pain or snoring.  ?Cardio: Denies chest pain, palpitations, irregular heartbeat, syncope, dyspnea, diaphoresis, orthopnea, PND, claudication or edema ?Respiratory: denies cough, dyspnea, DOE, pleurisy, hoarseness, laryngitis or wheezing.  ?Gastrointestinal: Denies dysphagia, heartburn, reflux, water brash, pain, cramps, nausea, vomiting, bloating, diarrhea, constipation, hematemesis, melena, hematochezia, jaundice or hemorrhoids ?Genitourinary: Denies dysuria, frequency, urgency, nocturia, hesitancy, discharge, hematuria or flank pain ?Musculoskeletal: Denies arthralgia, myalgia, stiffness, Jt. Swelling, pain, limp or strain/sprain. Denies Falls. ?Skin: Denies puritis, rash, hives, warts, acne, eczema or change in skin lesion ?Neuro: No weakness, tremor, incoordination, spasms, paresthesia or pain ?Psychiatric: Denies confusion, memory loss or sensory loss. Denies Depression. ?Endocrine: Denies change in weight, skin, hair change, nocturia, and paresthesia, diabetic polys,  visual blurring or hyper / hypo glycemic episodes.  ?Heme/Lymph: No excessive bleeding, bruising or enlarged lymph nodes. ? ? ?Physical Exam ? ?BP 120/70   Pulse 80   Temp 97.9 ?F (36.6 ?C)   Resp 16   Ht '5\' 1"'$  (1.549 m)   Wt 167 lb 9.6 oz (76 kg)   SpO2  96%   BMI 31.67 kg/m?  ? ?General Appearance: Over nourished and well groomed and in no apparent distress. ? ?Eyes: PERRLA, EOMs, conjunctiva no swelling or erythema, normal fundi and vessels. ?Sinuses: No frontal/maxillary tenderness ?ENT/Mouth: EACs patent / TMs  nl. Nares clear without erythema, swelling, mucoid exudates. Oral hygiene is good. No erythema, swelling, or exudate. Tongue normal, non-obstructing. Tonsils not swollen or erythematous. Hearing normal.  ?Neck: Supple, thyroid not palpable. No bruits, nodes or JVD. ?Respiratory: Respiratory effort normal.  BS equal and clear bilateral without rales, rhonci, wheezing or stridor. ?Cardio: Heart sounds are normal with regular rate and rhythm and no murmurs, rubs or gallops. Peripheral pulses are normal and equal bilaterally without edema. No aortic or femoral bruits. ?Chest: symmetric with normal excursions and percussion.  ?Abdomen: Soft, with Nl bowel sounds. Nontender, no guarding, rebound, hernias, masses, or organomegaly.  ?Lymphatics: Non tender without lymphadenopathy.  ?Musculoskeletal: Full ROM all peripheral extremities, joint stability, 5/5 strength, and normal gait. ?Skin: Warm and dry without rashes, lesions, cyanosis, clubbing or  ecchymosis.  ?Neuro: Cranial nerves intact, reflexes equal bilaterally. Normal muscle tone, no cerebellar symptoms. Sensation intact.  ?Pysch: Alert and oriented X 3 with normal affect, insight and judgment appropriate.  ? ?Assessment and Plan ? ?1. Annual Preventative/Screening Exam  ? ? ?2. Essential hypertension ? ?- EKG 12-Lead ?- CBC with Differential/Platelet ?- COMPLETE METABOLIC PANEL WITH GFR ?- Magnesium ?- TSH ? ?3. Hyperlipidemia, mixed ? ?- EKG  12-Lead ?- Lipid panel ?- TSH ? ?4. Abnormal glucose ? ?- EKG 12-Lead ?- Hemoglobin A1c ?- Insulin, random ? ?5. Vitamin D deficiency ? ?- VITAMIN D 25 Hydroxy  ? ?6. Hypothyroidism ? ?- TSH ? ?7. Statin myopathy ? ? ?8.

## 2021-03-22 ENCOUNTER — Other Ambulatory Visit: Payer: Self-pay | Admitting: Internal Medicine

## 2021-03-22 DIAGNOSIS — E039 Hypothyroidism, unspecified: Secondary | ICD-10-CM

## 2021-03-22 DIAGNOSIS — E782 Mixed hyperlipidemia: Secondary | ICD-10-CM

## 2021-03-22 LAB — CBC WITH DIFFERENTIAL/PLATELET
Absolute Monocytes: 393 cells/uL (ref 200–950)
Basophils Absolute: 41 cells/uL (ref 0–200)
Basophils Relative: 0.8 %
Eosinophils Absolute: 148 cells/uL (ref 15–500)
Eosinophils Relative: 2.9 %
HCT: 39 % (ref 35.0–45.0)
Hemoglobin: 13.1 g/dL (ref 11.7–15.5)
Lymphs Abs: 1204 cells/uL (ref 850–3900)
MCH: 30.2 pg (ref 27.0–33.0)
MCHC: 33.6 g/dL (ref 32.0–36.0)
MCV: 89.9 fL (ref 80.0–100.0)
MPV: 10.7 fL (ref 7.5–12.5)
Monocytes Relative: 7.7 %
Neutro Abs: 3315 cells/uL (ref 1500–7800)
Neutrophils Relative %: 65 %
Platelets: 329 10*3/uL (ref 140–400)
RBC: 4.34 10*6/uL (ref 3.80–5.10)
RDW: 12.1 % (ref 11.0–15.0)
Total Lymphocyte: 23.6 %
WBC: 5.1 10*3/uL (ref 3.8–10.8)

## 2021-03-22 LAB — URINALYSIS, ROUTINE W REFLEX MICROSCOPIC
Bilirubin Urine: NEGATIVE
Glucose, UA: NEGATIVE
Hgb urine dipstick: NEGATIVE
Ketones, ur: NEGATIVE
Leukocytes,Ua: NEGATIVE
Nitrite: NEGATIVE
Protein, ur: NEGATIVE
Specific Gravity, Urine: 1.005 (ref 1.001–1.035)
pH: 7.5 (ref 5.0–8.0)

## 2021-03-22 LAB — LIPID PANEL
Cholesterol: 219 mg/dL — ABNORMAL HIGH (ref <200)
HDL: 46 mg/dL — ABNORMAL LOW (ref >=50)
LDL Cholesterol (Calc): 141 mg/dL (calc) — ABNORMAL HIGH
Non-HDL Cholesterol (Calc): 173 mg/dL (calc) — ABNORMAL HIGH (ref <130)
Total CHOL/HDL Ratio: 4.8 (calc) (ref <5.0)
Triglycerides: 187 mg/dL — ABNORMAL HIGH (ref <150)

## 2021-03-22 LAB — COMPLETE METABOLIC PANEL WITH GFR
AG Ratio: 1.6 (calc) (ref 1.0–2.5)
ALT: 25 U/L (ref 6–29)
AST: 25 U/L (ref 10–35)
Albumin: 4.6 g/dL (ref 3.6–5.1)
Alkaline phosphatase (APISO): 57 U/L (ref 37–153)
BUN: 23 mg/dL (ref 7–25)
CO2: 30 mmol/L (ref 20–32)
Calcium: 10.3 mg/dL (ref 8.6–10.4)
Chloride: 102 mmol/L (ref 98–110)
Creat: 0.8 mg/dL (ref 0.60–1.00)
Globulin: 2.8 g/dL (calc) (ref 1.9–3.7)
Glucose, Bld: 80 mg/dL (ref 65–99)
Potassium: 3.9 mmol/L (ref 3.5–5.3)
Sodium: 144 mmol/L (ref 135–146)
Total Bilirubin: 0.5 mg/dL (ref 0.2–1.2)
Total Protein: 7.4 g/dL (ref 6.1–8.1)
eGFR: 76 mL/min/{1.73_m2} (ref >=60)

## 2021-03-22 LAB — MICROALBUMIN / CREATININE URINE RATIO
Creatinine, Urine: 9 mg/dL — ABNORMAL LOW (ref 20–275)
Microalb, Ur: <0.2 mg/dL

## 2021-03-22 LAB — MAGNESIUM: Magnesium: 2.2 mg/dL (ref 1.5–2.5)

## 2021-03-22 LAB — HEMOGLOBIN A1C
Hgb A1c MFr Bld: 5.5 % of total Hgb (ref <5.7)
Mean Plasma Glucose: 111 mg/dL
eAG (mmol/L): 6.2 mmol/L

## 2021-03-22 LAB — INSULIN, RANDOM: Insulin: 14.7 u[IU]/mL

## 2021-03-22 LAB — TSH: TSH: 0.28 mIU/L — ABNORMAL LOW (ref 0.40–4.50)

## 2021-03-22 LAB — VITAMIN D 25 HYDROXY (VIT D DEFICIENCY, FRACTURES): Vit D, 25-Hydroxy: 100 ng/mL (ref 30–100)

## 2021-03-22 MED ORDER — ROSUVASTATIN CALCIUM 10 MG PO TABS: ORAL_TABLET | ORAL | 1 refills | Status: DC

## 2021-03-22 NOTE — Progress Notes (Signed)
<><><><><><><><><><><><><><><><><><><><><><><><><><><><><><><><><> ?<><><><><><><><><><><><><><><><><><><><><><><><><><><><><><><><><> ?-   Test results slightly outside the reference range are not unusual.  ?If there is anything important, I will review this with you,  ?otherwise it is considered normal test values.  ?If you have further questions,  ?please do not hesitate to contact me at the office or via My Chart.  ?<><><><><><><><><><><><><><><><><><><><><><><><><><><><><><><><><> ?<><><><><><><><><><><><><><><><><><><><><><><><><><><><><><><><><> ? ?- ?Total ?Chol = ? 219 ? ?- Elevated  ?? ? ? ? ? ?( ?Ideal ?or ?Goal is less than 180 ?! ?)  ? ?- and  ? ?- ?Bad / Dangerous LDL ?Chol = ?141 ? - also Way too high  ?? ? ? ? ? ? ( ?Ideal ?or ?Goal is less than 70 ?! ?)  ? ?- Please restart your Rosuvastatin (Crestor)  ?? ? ? ? ? ? ? ? ? ? ? ? ? ? ? ? ? ? - New Rx sent to your [facility] [place]  ? ?- Diet is still very Important ?- Recommend a stricter low cholesterol diet  ? ?- Cholesterol only comes from animal sources  ?- ie. meat, dairy, egg yolks  ? ?- Eat all the vegetables you want.  ? ?- Avoid meat, Avoid Meat , Avoid Meat - ?especially red meat - Beef AND Pork .  ? ?- Avoid cheese & dairy - milk & ice cream. ?  ? ?- Cheese is the most concentrated form of trans-fats which  ?is the worst thing to clog up our arteries.  ? ?- Veggie cheese is OK which can be found in the fresh  ?produce section at [facility] or [employer] or [employer]  ?<><><><><><><><><><><><><><><><><><><><><><><><><><><><><><><><><> ?<><><><><><><><><><><><><><><><><><><><><><><><><><><><><><><><><> ? ?- ?Also TSH is very low ?which means thyroid hormone is too high in the Blood, ?  ? ?so recommend that you decrease your thyroid dose  ?? ? ? ? ? ? ? ? ? ? ? ? ? ? ? ? ? ? ? ? ? ? ? ? ? ? ? ? ? ? ? ?from Thyroxine 100 mcg 1 tablet Daily  ? ?Reduce to 1/2 tablet 3 x /week on Mon Wed & Fri &  ?? ? ? ? ? ? ? ? ? ? ? ? ? ? ? ?  ? ? ? ? ? ? ? ? ? ? ? ? ? ? ? ? ? 1 whole tablet 4 x /week on T ThSS  ? ?- This reduces to 5 &1/2 tablet /week  ? <><><><><><><><><><><><><><><><><><><><><><><><><><><><><><><><><> ?<><><><><><><><><><><><><><><><><><><><><><><><><><><><><><><><><> ? ?- ?Please call office to schedule a Nurse Visit to  ?                                                                recheck your Thyroid level in 1 month  ?<><><><><><><><><><><><><><><><><><><><><><><><><><><><><><><><><> ?<><><><><><><><><><><><><><><><><><><><><><><><><><><><><><><><><> ? ?- ?Vitamin D level = 100 - Excellent  -  Please keep your dose same  ?<><><><><><><><><><><><><><><><><><><><><><><><><><><><><><><><><> ?<><><><><><><><><><><><><><><><><><><><><><><><><><><><><><><><><> ? ?- ?All Else - CBC - Kidneys - U/A Electrolytes - Liver & Magnesium  ? ?- all  Normal / OK ? ?<><><><><><><><><><><><><><><><><><><><><><><><><><><><><><><><><> ?<><><><><><><><><><><><><><><><><><><><><><><><><><><><><><><><><> ? ? ? ?

## 2021-04-22 ENCOUNTER — Other Ambulatory Visit: Payer: Self-pay

## 2021-04-22 ENCOUNTER — Ambulatory Visit
Admission: RE | Admit: 2021-04-22 | Discharge: 2021-04-22 | Disposition: A | Discharge status: Home / Self Care | Payer: PPO | Source: Ambulatory Visit | Attending: Nurse Practitioner | Admitting: Nurse Practitioner

## 2021-04-22 ENCOUNTER — Ambulatory Visit (INDEPENDENT_AMBULATORY_CARE_PROVIDER_SITE_OTHER): Payer: PPO

## 2021-04-22 VITALS — BP 118/68 | HR 89 | Temp 97.2°F

## 2021-04-22 DIAGNOSIS — M79671 Pain in right foot: Secondary | ICD-10-CM | POA: Diagnosis not present

## 2021-04-22 DIAGNOSIS — R58 Hemorrhage, not elsewhere classified: Secondary | ICD-10-CM

## 2021-04-22 DIAGNOSIS — E039 Hypothyroidism, unspecified: Secondary | ICD-10-CM

## 2021-04-22 DIAGNOSIS — R2241 Localized swelling, mass and lump, right lower limb: Secondary | ICD-10-CM

## 2021-04-22 DIAGNOSIS — M7989 Other specified soft tissue disorders: Secondary | ICD-10-CM | POA: Diagnosis not present

## 2021-04-22 NOTE — Progress Notes (Signed)
Patient presents to the office for a nurse visit to have labs done to check Thyroid levels. States that she is currently taking Thyroxine, 51mg, 1/2 tablet M, W, F and 1 tablet all other days.  ?During her visit, patient has an injury to the top of the right foot, dropped a hymnal on it on Saturday. Has swelling and bruising. Provider aware and examined it and ordered an xray.  ? ?

## 2021-04-23 LAB — TSH: TSH: 0.73 mIU/L (ref 0.40–4.50)

## 2021-04-23 NOTE — Progress Notes (Signed)
<><><><><><><><><><><><><><><><><><><><><><><><><><><><><><><><><> ?<><><><><><><><><><><><><><><><><><><><><><><><><><><><><><><><><> ?-   Test results slightly outside the reference range are not unusual. ?If there is anything important, I will review this with you,  ?otherwise it is considered normal test values.  ?If you have further questions,  ?please do not hesitate to contact me at the office or via My Chart.  ?<><><><><><><><><><><><><><><><><><><><><><><><><><><><><><><><><> ?<><><><><><><><><><><><><><><><><><><><><><><><><><><><><><><><><> ? ?-  TSH / Thyroid test returned Great - Please keep dose same. ? ?<><><><><><><><><><><><><><><><><><><><><><><><><><><><><><><><><> ?<><><><><><><><><><><><><><><><><><><><><><><><><><><><><><><><><> ? ?

## 2021-04-29 ENCOUNTER — Telehealth: Payer: Self-pay | Admitting: Nurse Practitioner

## 2021-04-29 NOTE — Telephone Encounter (Signed)
Pt was seen last week for a NV but a x-ray was ordered because she had been complaining of foot pain. She is still in a lot of pain and is saying there is a large knot on the top part of foot now, she was wanting something sent in for her but I am thinking she will need a OV. Please advise.  ?

## 2021-05-02 ENCOUNTER — Ambulatory Visit
Admission: RE | Admit: 2021-05-02 | Discharge: 2021-05-02 | Disposition: A | Discharge status: Home / Self Care | Payer: PPO | Source: Ambulatory Visit | Attending: Nurse Practitioner | Admitting: Nurse Practitioner

## 2021-05-02 ENCOUNTER — Ambulatory Visit (INDEPENDENT_AMBULATORY_CARE_PROVIDER_SITE_OTHER): Payer: PPO | Admitting: Nurse Practitioner

## 2021-05-02 ENCOUNTER — Encounter: Payer: Self-pay | Admitting: Nurse Practitioner

## 2021-05-02 VITALS — BP 126/82 | HR 64 | Temp 97.5°F | Wt 169.0 lb

## 2021-05-02 DIAGNOSIS — R58 Hemorrhage, not elsewhere classified: Secondary | ICD-10-CM

## 2021-05-02 DIAGNOSIS — R2241 Localized swelling, mass and lump, right lower limb: Secondary | ICD-10-CM

## 2021-05-02 DIAGNOSIS — R2689 Other abnormalities of gait and mobility: Secondary | ICD-10-CM | POA: Diagnosis not present

## 2021-05-02 DIAGNOSIS — M79671 Pain in right foot: Secondary | ICD-10-CM

## 2021-05-02 DIAGNOSIS — M7989 Other specified soft tissue disorders: Secondary | ICD-10-CM | POA: Diagnosis not present

## 2021-05-02 DIAGNOSIS — M7731 Calcaneal spur, right foot: Secondary | ICD-10-CM | POA: Diagnosis not present

## 2021-05-02 DIAGNOSIS — S99921A Unspecified injury of right foot, initial encounter: Secondary | ICD-10-CM | POA: Diagnosis not present

## 2021-05-02 NOTE — Progress Notes (Signed)
Assessment and Plan: ? ?[name] was seen today for a follow up. ? ?Diagnoses and all order for this visit: ? ?1. Right foot pain ? ?- DG Foot Complete Right; Future ? ?2. Localized swelling of right foot ? ?- DG Foot Complete Right; Future ? ?3. Ecchymosis ? ?- DG Foot Complete Right; Future ? ?4. Limping ?Continue RICE method ?Tylenol on board. ?Possible referral to Orthopedics ? ?Notify office for further evaluation and treatment, questions or concerns if s/s fail to improve. The risks and benefits of my recommendations, as well as other treatment options were discussed with the patient today. Questions were answered. ? ?Further disposition pending results of labs. Discussed med's effects and SE's.   ? ?Over 15 minutes of exam, counseling, chart review, and critical decision making was performed.  ? ?Future Appointments  ?Date Time Provider [place]  ?06/27/2021 11:00 AM Kim Jump, NP [facility] None  ?11/04/2021 10:00 AM [name], Kim Roger, NP [facility] None  ?03/27/2022 11:00 AM Kim Pinto, MD [facility] None  ? ? ?------------------------------------------------------------------------------------------------------------------ ? ? ?HPI ?BP 126/82   Pulse 64   Temp (!) 97.5 ?F (36.4 ?C)   Wt 169 lb (76.7 kg)   SpO2 99%   BMI 31.93 kg/m?  ?76 y.o.female presents for evaluation of right foot pain after dropping a large hymenal on her foot on  04/20/21.  She was initially evaluated with a nurse visit and x-ray was completed to reveal no acute findings.  However, she she continues to be concerned for a large hematoma over the top of the foot, continued swelling, bruising,and unrelenting pain despite 1500 mg Tylenol. She has not taken anti-inflammatory as they upset her stomach. Of note, she was unable to sleep last night d/t the pain.  She has tried implementing the RICE mehtod but is a busy woman and has stayed mostly on her feet.   Denies any fever, chills.   ? ?Past Medical History:   ?Diagnosis Date  ? Vitamin D deficiency   ?  ? ?Allergies  ?Allergen Reactions  ? Aspirin   ?  REACTION: GI ulcers  ? Fish Oil   ?  Allergic to fish oil and shell fish  ? Morphine And Related   ? Sulfa Antibiotics   ? Vicodin [Hydrocodone-Acetaminophen] Nausea Only  ? ? ?Current Outpatient Medications on File Prior to Visit  ?Medication Sig  ? Acetaminophen (TYLENOL EXTRA STRENGTH PO) Take by mouth.  ? aspirin 81 MG tablet Take 81 mg by mouth daily.  ? Calcium Carbonate (CALCIUM 500 PO) Take 1 tablet by mouth daily.  ? Cholecalciferol (VITAMIN D PO) Take 2,000 Int'l Units by mouth 2 (two) times daily.   ? ezetimibe (ZETIA) 10 MG tablet Take       1 tablet      Daily       for Cholesterol  ? fenofibrate (TRICOR) 145 MG tablet Take 1 tablet Daily for Triglycerides (Blood Fats )  ? furosemide (LASIX) 40 MG tablet TAKE 1 TABLET TWICE A DAY FOR BP & FLUID  ? gabapentin (NEURONTIN) 300 MG capsule Take 300 mg by mouth at bedtime.  ? GINKGO BILOBA PLUS PO Take 60 mg by mouth daily.  ? levothyroxine (SYNTHROID) 88 MCG tablet Take 88 mcg by mouth daily.  ? Magnesium 500 MG TABS Take by mouth.  ? Multiple Vitamins-Minerals (MULTIVITAMIN WITH MINERALS) tablet Take 1 tablet by mouth daily.  ? rosuvastatin (CRESTOR) 10 MG tablet Take  1 tablet  Daily  for Cholesterol  ?  TURMERIC PO Take 1,500 mg by mouth daily.  ? vitamin C (ASCORBIC ACID) 500 MG tablet Take 1,000 mg by mouth daily.   ? albuterol (VENTOLIN HFA) 108 (90 Base) MCG/ACT inhaler Inhale 2 puffs into the lungs every 6 (six) hours as needed for wheezing or shortness of breath. (Patient not taking: Reported on 03/21/2021)  ? famotidine (PEPCID) 20 MG tablet Take 1 tablet (20 mg total) by mouth daily.  ? levothyroxine (SYNTHROID) 75 MCG tablet Take  1 tablet  Daily  on an empty stomach with only water for 30 minutes & no Antacid meds, Calcium or Magnesium for 4 hours & avoid Biotin (Patient not taking: Reported on 05/02/2021)  ? ?No current facility-administered medications  on file prior to visit.  ? ? ?ROS: all negative except what is noted in the HPI.  ? ?Physical Exam: ? ?BP 126/82   Pulse 64   Temp (!) 97.5 ?F (36.4 ?C)   Wt 169 lb (76.7 kg)   SpO2 99%   BMI 31.93 kg/m?  ? ?General Appearance: NAD.  Awake, conversant and cooperative. ?Eyes: PERRLA, EOMs intact.  Sclera white.  Conjunctiva without erythema. ?Sinuses: No frontal/maxillary tenderness.  No nasal discharge. Nares patent.  ?ENT/Mouth: Ext aud canals clear.  Bilateral TMs w/DOL and without erythema or bulging. Hearing intact.  Posterior pharynx without swelling or exudate.  Tonsils without swelling or erythema.  ?Neck: Supple.  No masses, nodules or thyromegaly. ?Respiratory: Effort is regular with non-labored breathing. Breath sounds are equal bilaterally without rales, rhonchi, wheezing or stridor.  ?Cardio: RRR with no MRGs. Brisk peripheral pulses without edema.  ?Abdomen: Active BS in all four quadrants.  Soft and non-tender without guarding, rebound tenderness, hernias or masses. ?Lymphatics: Non tender without lymphadenopathy.  ?Musculoskeletal: RLE with moderate edema, approximately 2 cm round raised edematous area with ecchymosis that spreads from lateral malleolus to proximal phalanges.  LROM, 4/5 strength, antalgic gait.   ?Skin: Appropriate color for ethnicity. Warm without rashes, lesions, ecchymosis, ulcers.  ?Neuro: CN II-XII grossly normal. Normal muscle tone without cerebellar symptoms and intact sensation.   ?Psych: AO X 3,  appropriate mood and affect, insight and judgment.  ?  ? ?Kim Jump, NP ?2:07 PM ?[facility] ? ?

## 2021-05-13 ENCOUNTER — Other Ambulatory Visit: Payer: Self-pay | Admitting: Nurse Practitioner

## 2021-05-13 ENCOUNTER — Telehealth: Payer: Self-pay | Admitting: Nurse Practitioner

## 2021-05-13 DIAGNOSIS — R2241 Localized swelling, mass and lump, right lower limb: Secondary | ICD-10-CM

## 2021-05-13 DIAGNOSIS — M79671 Pain in right foot: Secondary | ICD-10-CM

## 2021-05-13 DIAGNOSIS — R2689 Other abnormalities of gait and mobility: Secondary | ICD-10-CM

## 2021-05-13 DIAGNOSIS — R58 Hemorrhage, not elsewhere classified: Secondary | ICD-10-CM

## 2021-05-13 NOTE — Telephone Encounter (Signed)
Pt does not feel that her foot is getting nay better, she says it even looks a bit bigger. Wanting to know if she would be referred to an Orthopedic or for more imaging.  ?

## 2021-05-16 ENCOUNTER — Ambulatory Visit: Payer: PPO | Admitting: Orthopedic Surgery

## 2021-05-16 DIAGNOSIS — M25571 Pain in right ankle and joints of right foot: Secondary | ICD-10-CM

## 2021-05-18 ENCOUNTER — Encounter: Payer: Self-pay | Admitting: Orthopedic Surgery

## 2021-05-18 NOTE — Progress Notes (Signed)
? ?Office Visit Note ?  ?Patient: [name]           ?Date of Birth: 05/13/1945           ?MRN: [id number] ?Visit Date: 05/16/2021 ?             ?Requested by: Darrol Jump, NP ?[address] ?[place],  [place] [id number] ?PCP: Unk Pinto, MD ? ?Chief Complaint  ?Patient presents with  ? Right Foot - Pain  ?  DOI 04/21/2021 dropped large book on foot   ? ? ? ? ?HPI: ?Patient is a 76 year old woman who presents with right foot pain.  She states she dropped a large book on her foot on 04/20/2021.  Patient complains of a hematoma on the dorsum of the right foot.  Patient is currently on baby aspirin. ? ?Assessment & Plan: ?Visit Diagnoses:  ?1. Pain in right ankle and joints of right foot   ? ? ?Plan: Recommended holding the aspirin until the bruising resolves.  Recommended wiggling the toes using a compression sock as well as warm compress. ? ?Follow-Up Instructions: Return if symptoms worsen or fail to improve.  ? ?Ortho Exam ? ?Patient is alert, oriented, no adenopathy, well-dressed, normal affect, normal respiratory effort. ?Examination patient has a good pulse she has a hematoma on the dorsum of the foot which is about 3 cm in diameter.  She has ecchymosis that goes down to the toes and heels.  There is no skin breakdown.  She has decreased range of motion of toes 3 4 and 5 and she was instructed and demonstrated passive and active range of motion of the toes. ? ?Imaging: ?No results found. ?No images are attached to the encounter. ? ?Labs: ?Lab Results  ?Component Value Date  ? HGBA1C 5.5 03/21/2021  ? HGBA1C 5.5 05/14/2020  ? HGBA1C 5.5 11/07/2019  ? ? ? ?Lab Results  ?Component Value Date  ? ALBUMIN 4.5 06/11/2016  ? ALBUMIN 4.5 02/07/2016  ? ALBUMIN 4.8 11/06/2015  ? ? ?Lab Results  ?Component Value Date  ? MG 2.2 03/21/2021  ? MG 2.2 05/14/2020  ? MG 2.4 11/07/2019  ? ?Lab Results  ?Component Value Date  ? VD25OH 100 03/21/2021  ? VD25OH 88 05/14/2020  ? VD25OH 91 11/07/2019  ? ? ?No  results found for: PREALBUMIN ? ?  Latest Ref Rng & Units 03/21/2021  ? 10:51 AM 11/26/2020  ? 11:24 AM 05/14/2020  ?  3:42 PM  ?CBC EXTENDED  ?WBC 3.8 - 10.8 Thousand/uL 5.1   6.3   8.1    ?RBC 3.80 - 5.10 Million/uL 4.34   4.38   4.62    ?Hemoglobin 11.7 - 15.5 g/dL 13.1   13.4   13.9    ?HCT 35.0 - 45.0 % 39.0   39.8   42.4    ?Platelets 140 - 400 Thousand/uL 329   343   346    ?NEUT# 1,500 - 7,800 cells/uL 3,315   4,328   5,484    ?Lymph# 850 - 3,900 cells/uL 1,204   1,418   1,879    ? ? ? ?There is no height or weight on file to calculate BMI. ? ?Orders:  ?No orders of the defined types were placed in this encounter. ? ?No orders of the defined types were placed in this encounter. ? ? ? Procedures: ?No procedures performed ? ?Clinical Data: ?No additional findings. ? ?ROS: ? ?All other systems negative, except as noted in the HPI. ?  Review of Systems ? ?Objective: ?Vital Signs: There were no vitals taken for this visit. ? ?Specialty Comments:  ?No specialty comments available. ? ?PMFS History: ?Patient Active Problem List  ? Diagnosis Date Noted  ? Insomnia 05/29/2018  ? Hypothyroid 04/13/2017  ? Benign paroxysmal positional vertigo 02/07/2016  ? BMI 29.0-29.9,adult 09/21/2014  ? Encounter for Medicare annual wellness exam 09/21/2014  ? Medication management 08/15/2013  ? Abnormal glucose 01/26/2013  ? Vitamin D deficiency 12/13/2012  ? GERD   ? Hyperlipidemia, mixed   ? Depression, major, in remission ([place])   ? Essential hypertension 09/09/2008  ? ?Past Medical History:  ?Diagnosis Date  ? Vitamin D deficiency   ?  ?Family History  ?Problem Relation Age of Onset  ? Diabetes Mother   ? Kidney disease Mother   ? Heart disease Mother   ? Hypertension Father   ? Heart disease Father   ? Heart disease Sister   ?  ?Past Surgical History:  ?Procedure Laterality Date  ? ABDOMINAL HYSTERECTOMY  1981  ? partial  ? APPENDECTOMY    ? BACK SURGERY    ? CESAREAN SECTION    ? SALPINGOOPHORECTOMY  1981 left  ? 1982 right   ? ?Social History  ? ?Occupational History  ? Not on file  ?Tobacco Use  ? Smoking status: Former  ?  Types: Cigarettes  ?  Quit date: 01/07/2008  ?  Years since quitting: 13.3  ? Smokeless tobacco: Never  ?Substance and Sexual Activity  ? Alcohol use: No  ? Drug use: No  ? Sexual activity: Not Currently  ? ? ? ? ? ?

## 2021-05-24 ENCOUNTER — Other Ambulatory Visit: Payer: Self-pay | Admitting: Internal Medicine

## 2021-05-24 DIAGNOSIS — I10 Essential (primary) hypertension: Secondary | ICD-10-CM

## 2021-05-30 ENCOUNTER — Other Ambulatory Visit: Payer: Self-pay

## 2021-05-30 DIAGNOSIS — Z1212 Encounter for screening for malignant neoplasm of rectum: Secondary | ICD-10-CM | POA: Diagnosis not present

## 2021-05-30 DIAGNOSIS — Z1211 Encounter for screening for malignant neoplasm of colon: Secondary | ICD-10-CM

## 2021-05-30 LAB — POC HEMOCCULT BLD/STL (HOME/3-CARD/SCREEN)
Card #2 Fecal Occult Blod, POC: NEGATIVE
Card #3 Fecal Occult Blood, POC: NEGATIVE
Fecal Occult Blood, POC: NEGATIVE

## 2021-06-10 ENCOUNTER — Other Ambulatory Visit: Payer: Self-pay | Admitting: Internal Medicine

## 2021-06-10 ENCOUNTER — Ambulatory Visit: Payer: PPO | Admitting: Nurse Practitioner

## 2021-06-10 ENCOUNTER — Encounter: Payer: Self-pay | Admitting: Nurse Practitioner

## 2021-06-10 DIAGNOSIS — U071 COVID-19: Secondary | ICD-10-CM

## 2021-06-10 MED ORDER — AZITHROMYCIN 250 MG PO TABS: ORAL_TABLET | ORAL | 1 refills | Status: DC

## 2021-06-10 MED ORDER — DEXAMETHASONE 1 MG PO TABS: ORAL_TABLET | ORAL | 0 refills | Status: DC

## 2021-06-10 NOTE — Progress Notes (Signed)
THIS ENCOUNTER IS A VIRTUAL VISIT DUE TO COVID-19 - PATIENT WAS NOT SEEN IN THE OFFICE.  PATIENT HAS CONSENTED TO VIRTUAL VISIT / TELEMEDICINE VISIT   Virtual Visit via telephone Note  I connected with  [name] on 06/10/2021 by telephone.  I verified that I am speaking with the correct person using two identifiers.    I discussed the limitations of evaluation and management by telemedicine and the availability of in person appointments. The patient expressed understanding and agreed to proceed.  History of Present Illness:  There were no vitals taken for this visit. 76 y.o. patient contacted office reporting URI sx . she tested positive by 06/09/21. OV was conducted by telephone to minimize exposure. This patient was vaccinated for covid 19, last 03/20/21 Immunization History  Administered Date(s) Administered   Fluad Quad(high Dose 65+) 10/23/2019   Influenza, High Dose Seasonal PF 10/18/2014, 11/06/2015, 10/22/2017   Influenza-Unspecified 10/13/2016, 10/26/2019   PFIZER(Purple Top)SARS-COV-2 Vaccination 05/14/2019, 11/13/2019, 11/13/2019, 07/14/2020, 07/14/2020   Pfizer Covid-19 Vaccine Bivalent Booster 89yr & up 03/20/2021   Pneumococcal Conjugate-13 06/21/2014   Pneumococcal Polysaccharide-23 01/21/2012   Td 01/21/2012    Sx began 3 days ago with Sinus pressure, nonproductive cough, chills, fatigue, bodyaches  Treatments tried so far: Corcidin cough and cold, tylenol  Exposures: Member at [employer]  Medications  Current Outpatient Medications (Endocrine & Metabolic):    levothyroxine (SYNTHROID) 88 MCG tablet, Take 88 mcg by mouth daily. 1/2 tab M W F and 1 tab the rest of the days   levothyroxine (SYNTHROID) 75 MCG tablet, Take  1 tablet  Daily  on an empty stomach with only water for 30 minutes & no Antacid meds, Calcium or Magnesium for 4 hours & avoid Biotin (Patient not taking: Reported on 05/02/2021)  Current Outpatient Medications (Cardiovascular):    ezetimibe  (ZETIA) 10 MG tablet, Take       1 tablet      Daily       for Cholesterol   fenofibrate (TRICOR) 145 MG tablet, Take 1 tablet Daily for Triglycerides (Blood Fats )   furosemide (LASIX) 40 MG tablet, TAKE 1 TABLET TWICE A DAY FOR BP & FLUID   rosuvastatin (CRESTOR) 10 MG tablet, Take  1 tablet  Daily  for Cholesterol (Patient taking differently: Take  1 tablet  Daily  for Cholesterol Takes 1 tablet every other day)  Current Outpatient Medications (Respiratory):    albuterol (VENTOLIN HFA) 108 (90 Base) MCG/ACT inhaler, Inhale 2 puffs into the lungs every 6 (six) hours as needed for wheezing or shortness of breath. (Patient not taking: Reported on 03/21/2021)  Current Outpatient Medications (Analgesics):    Acetaminophen (TYLENOL EXTRA STRENGTH PO), Take by mouth.   aspirin 81 MG tablet, Take 81 mg by mouth daily.   Current Outpatient Medications (Other):    Calcium Carbonate (CALCIUM 500 PO), Take 1 tablet by mouth daily.   Cholecalciferol (VITAMIN D PO), Take 2,000 Int'l Units by mouth 2 (two) times daily.    gabapentin (NEURONTIN) 300 MG capsule, Take 300 mg by mouth at bedtime.   GINKGO BILOBA PLUS PO, Take 60 mg by mouth daily.   Magnesium 500 MG TABS, Take by mouth.   Multiple Vitamins-Minerals (MULTIVITAMIN WITH MINERALS) tablet, Take 1 tablet by mouth daily.   TURMERIC PO, Take 1,500 mg by mouth daily.   vitamin C (ASCORBIC ACID) 500 MG tablet, Take 1,000 mg by mouth daily.    famotidine (PEPCID) 20 MG tablet, Take 1  tablet (20 mg total) by mouth daily.  Allergies:  Allergies  Allergen Reactions   Aspirin     REACTION: GI ulcers   Fish Oil     Allergic to fish oil and shell fish   Morphine And Related    Sulfa Antibiotics    Vicodin [Hydrocodone-Acetaminophen] Nausea Only    Problem list She has Essential hypertension; GERD; Hyperlipidemia, mixed; Depression, major, in remission ([place]); Vitamin D deficiency; Abnormal glucose; Medication management; BMI 29.0-29.9,adult;  Encounter for Medicare annual wellness exam; Benign paroxysmal positional vertigo; Hypothyroid; and Insomnia on their problem list.   Social History:   reports that she quit smoking about 13 years ago. Her smoking use included cigarettes. She has never used smokeless tobacco. She reports that she does not drink alcohol and does not use drugs.  Observations/Objective:  General : Well sounding patient in no apparent distress HEENT: no hoarseness, no cough for duration of visit Lungs: speaks in complete sentences, no audible wheezing, no apparent distress Neurological: alert, oriented x 3 Psychiatric: pleasant, judgement appropriate   Assessment and Plan:  Covid 19 Covid 19 positive per rapid screening test in home yesterday Risk factors include: Hypertension, overweight Symptoms are: mild Immue support reviewed  Take tylenol PRN temp 101+ Push hydration Regular ambulation or calf exercises exercises for clot prevention and 81 mg ASA unless contraindicated Sx supportive therapy suggested Follow up via mychart or telephone if needed Advised patient obtain O2 monitor; present to ED if persistently <90% or with severe dyspnea, CP, fever uncontrolled by tylenol, confusion, sudden decline Should remain in isolation until at least 5 days from onset of sx, 24-48 hours fever free without tylenol, sx such as cough are improved.      Follow Up Instructions:  I discussed the assessment and treatment plan with the patient. The patient was provided an opportunity to ask questions and all were answered. The patient agreed with the plan and demonstrated an understanding of the instructions.   The patient was advised to call back or seek an in-person evaluation if the symptoms worsen or if the condition fails to improve as anticipated.  I provided 20 minutes of non-face-to-face time during this encounter.   Magda Bernheim, NP

## 2021-06-27 ENCOUNTER — Ambulatory Visit (INDEPENDENT_AMBULATORY_CARE_PROVIDER_SITE_OTHER): Payer: PPO | Admitting: Nurse Practitioner

## 2021-06-27 ENCOUNTER — Encounter: Payer: Self-pay | Admitting: Nurse Practitioner

## 2021-06-27 VITALS — BP 120/62 | HR 65 | Temp 97.3°F | Wt 170.4 lb

## 2021-06-27 DIAGNOSIS — Z79899 Other long term (current) drug therapy: Secondary | ICD-10-CM

## 2021-06-27 DIAGNOSIS — E039 Hypothyroidism, unspecified: Secondary | ICD-10-CM

## 2021-06-27 DIAGNOSIS — K2101 Gastro-esophageal reflux disease with esophagitis, with bleeding: Secondary | ICD-10-CM | POA: Diagnosis not present

## 2021-06-27 DIAGNOSIS — I10 Essential (primary) hypertension: Secondary | ICD-10-CM

## 2021-06-27 DIAGNOSIS — G4700 Insomnia, unspecified: Secondary | ICD-10-CM | POA: Diagnosis not present

## 2021-06-27 DIAGNOSIS — U099 Post covid-19 condition, unspecified: Secondary | ICD-10-CM | POA: Diagnosis not present

## 2021-06-27 DIAGNOSIS — E782 Mixed hyperlipidemia: Secondary | ICD-10-CM | POA: Diagnosis not present

## 2021-06-27 DIAGNOSIS — F325 Major depressive disorder, single episode, in full remission: Secondary | ICD-10-CM | POA: Diagnosis not present

## 2021-06-27 DIAGNOSIS — M79671 Pain in right foot: Secondary | ICD-10-CM

## 2021-06-27 NOTE — Patient Instructions (Signed)

## 2021-06-27 NOTE — Progress Notes (Signed)
FOLLOW UP  Assessment and Plan:   1. Essential hypertension Controlled  Continue medications;  Discussed DASH (Dietary Approaches to Stop Hypertension) DASH diet is lower in sodium than a typical American diet. Cut back on foods that are high in saturated fat, cholesterol, and trans fats. Eat more whole-grain foods, fish, poultry, and nuts Remain active and exercise as tolerated daily.  Monitor BP at home-Call if greater than 130/80.   - CBC with Differential/Platelet - COMPLETE METABOLIC PANEL WITH GFR  2. Gastroesophageal reflux disease with esophagitis and hemorrhage Controlled. Continue TUMS PRN Avoid triggers. Avoid laying down after eating.  3. Hypothyroidism, unspecified type Out of range, below normal last check. Continue to monitor  - TSH  4. Hyperlipidemia, mixed Uncontrolled Continue medications; Rosuvastatin, Zetia Discussed lifestyle modifications. Recommended diet heavy in fruits and veggies, omega 3's. Decrease consumption of animal meats, cheeses, and dairy products. Remain active and exercise as tolerated. Continue to monitor.  - Lipid panel  5. Depression, major, in remission ([place]) Controlled. Continue to monitor   6. Insomnia, unspecified type Likely due to low TSH levels. Continue good sleep hygiene Continue to monitor  7.  Right foot pain. Elevated for edema. Consider compression stocking. Remain active.   8. Medication management All medications discussed and reviewed in full. All questions and concerns regarding medications addressed.    - CBC with Differential/Platelet - COMPLETE METABOLIC PANEL WITH GFR - Lipid panel - TSH  9. Post covid-19 condition, unspecified Nasal congestion and occasional HA. Zyrtec PRN. Tylenol PRN. Continue to monitor  Continue diet and meds as discussed. Further disposition pending results of labs. Discussed med's effects and SE's.   Over 30 minutes of exam, counseling, chart review, and  critical decision making was performed.   Future Appointments  Date Time Provider [place]  11/04/2021 10:00 AM Alycia Rossetti, NP [facility] None  03/27/2022 11:00 AM Unk Pinto, MD [facility] None    ----------------------------------------------------------------------------------------------------------------------  HPI 76 y.o. female  presents for 3 month follow up on hypertension, cholesterol, diabetes, weight and vitamin D deficiency.   She has recently been treated for positive Covid 06/10/21.  She has some lingering symptoms which include nasal congestion and HA.    Her right foot has improved.  She has seen Orthopedics and was advised that no further treatment was necessary. She is trying to remain active.  She does not wear support hose.  She continues to have mild to moderate edema.  Denies pain.    BMI is Body mass index is 32.2 kg/m., she has not been working on diet and exercise. Wt Readings from Last 3 Encounters:  06/27/21 170 lb 6.4 oz (77.3 kg)  05/02/21 169 lb (76.7 kg)  03/21/21 167 lb 9.6 oz (76 kg)    Her blood pressure has been controlled at home, today their BP is BP: 120/62  She does workout, tries to walk daily, active in the church. She denies chest pain, shortness of breath, dizziness.   She is on cholesterol medication Rosuvastatin and Zetia and denies myalgias. Her cholesterol is not at goal. The cholesterol last visit was:   Lab Results  Component Value Date   CHOL 219 (H) 03/21/2021   HDL 46 (L) 03/21/2021   LDLCALC 141 (H) 03/21/2021   TRIG 187 (H) 03/21/2021   CHOLHDL 4.8 03/21/2021    She has been working on diet and exercise for prediabetes, and denies polydipsia and polyuria. Last A1C in the office was:  Lab Results  Component Value Date  HGBA1C 5.5 03/21/2021   Patient is on Vitamin D supplement.   Lab Results  Component Value Date   VD25OH 100 03/21/2021        Current Medications:  Current Outpatient  Medications on File Prior to Visit  Medication Sig   Acetaminophen (TYLENOL EXTRA STRENGTH PO) Take by mouth.   aspirin 81 MG tablet Take 81 mg by mouth daily.   Calcium Carbonate (CALCIUM 500 PO) Take 1 tablet by mouth daily.   Cholecalciferol (VITAMIN D PO) Take 2,000 Int'l Units by mouth 2 (two) times daily.    ezetimibe (ZETIA) 10 MG tablet Take       1 tablet      Daily       for Cholesterol   fenofibrate (TRICOR) 145 MG tablet Take 1 tablet Daily for Triglycerides (Blood Fats )   furosemide (LASIX) 40 MG tablet TAKE 1 TABLET TWICE A DAY FOR BP & FLUID   gabapentin (NEURONTIN) 300 MG capsule Take 300 mg by mouth at bedtime.   GINKGO BILOBA PLUS PO Take 60 mg by mouth daily.   levothyroxine (SYNTHROID) 88 MCG tablet Take 88 mcg by mouth daily. 1/2 tab M W F and 1 tab the rest of the days   Magnesium 500 MG TABS Take by mouth.   Multiple Vitamins-Minerals (MULTIVITAMIN WITH MINERALS) tablet Take 1 tablet by mouth daily.   rosuvastatin (CRESTOR) 10 MG tablet Take  1 tablet  Daily  for Cholesterol (Patient taking differently: Take  1 tablet  Daily  for Cholesterol Takes 1 tablet every other day)   TURMERIC PO Take 1,500 mg by mouth daily.   vitamin C (ASCORBIC ACID) 500 MG tablet Take 1,000 mg by mouth daily. Takes 2,'000mg'$    azithromycin (ZITHROMAX) 250 MG tablet Take 2 tablets (500 mg) on  Day 1,  followed by 1 tablet (250 mg) once daily on Days 2 through 5. (Patient not taking: Reported on 06/27/2021)   dexamethasone (DECADRON) 1 MG tablet Take 3 tabs for 3 days, 2 tabs for 3 days 1 tab for 5 days. Take with food. (Patient not taking: Reported on 06/27/2021)   famotidine (PEPCID) 20 MG tablet Take 1 tablet (20 mg total) by mouth daily.   No current facility-administered medications on file prior to visit.     Allergies:  Allergies  Allergen Reactions   Aspirin     REACTION: GI ulcers   Fish Oil     Allergic to fish oil and shell fish   Morphine And Related    Sulfa Antibiotics     Vicodin [Hydrocodone-Acetaminophen] Nausea Only     Medical History:  Past Medical History:  Diagnosis Date   Vitamin D deficiency    Family history- Reviewed and unchanged Social history- Reviewed and unchanged   Review of Systems:  ROS    Physical Exam: BP 120/62   Pulse 65   Temp (!) 97.3 F (36.3 C)   Wt 170 lb 6.4 oz (77.3 kg)   SpO2 99%   BMI 32.20 kg/m  Wt Readings from Last 3 Encounters:  06/27/21 170 lb 6.4 oz (77.3 kg)  05/02/21 169 lb (76.7 kg)  03/21/21 167 lb 9.6 oz (76 kg)   General Appearance: Well nourished, in no apparent distress. Eyes: PERRLA, EOMs, conjunctiva no swelling or erythema Sinuses: No Frontal/maxillary tenderness ENT/Mouth: Ext aud canals clear, TMs without erythema, bulging. No erythema, swelling, or exudate on post pharynx.  Tonsils not swollen or erythematous. Hearing normal.  Neck: Supple, thyroid  normal.  Respiratory: Respiratory effort normal, BS equal bilaterally without rales, rhonchi, wheezing or stridor.  Cardio: RRR with no MRGs. Brisk peripheral pulses without edema.  Abdomen: Soft, + BS.  Non tender, no guarding, rebound, hernias, masses. Lymphatics: Non tender without lymphadenopathy.  Musculoskeletal: Full ROM, 5/5 strength, Normal gait Skin: Warm, dry without rashes, lesions, ecchymosis.  Neuro: Cranial nerves intact. No cerebellar symptoms.  Psych: Awake and oriented X 3, normal affect, Insight and Judgment appropriate.    Darrol Jump, NP 11:08 AM [facility]

## 2021-06-28 LAB — COMPLETE METABOLIC PANEL WITH GFR
AG Ratio: 1.6 (calc) (ref 1.0–2.5)
ALT: 22 U/L (ref 6–29)
AST: 34 U/L (ref 10–35)
Albumin: 4.7 g/dL (ref 3.6–5.1)
Alkaline phosphatase (APISO): 56 U/L (ref 37–153)
BUN: 21 mg/dL (ref 7–25)
CO2: 23 mmol/L (ref 20–32)
Calcium: 9.5 mg/dL (ref 8.6–10.4)
Chloride: 102 mmol/L (ref 98–110)
Creat: 0.66 mg/dL (ref 0.60–1.00)
Globulin: 2.9 g/dL (calc) (ref 1.9–3.7)
Glucose, Bld: 90 mg/dL (ref 65–99)
Potassium: 4.5 mmol/L (ref 3.5–5.3)
Sodium: 143 mmol/L (ref 135–146)
Total Bilirubin: 0.5 mg/dL (ref 0.2–1.2)
Total Protein: 7.6 g/dL (ref 6.1–8.1)
eGFR: 91 mL/min/{1.73_m2} (ref >=60)

## 2021-06-28 LAB — LIPID PANEL
Cholesterol: 206 mg/dL — ABNORMAL HIGH (ref <200)
HDL: 53 mg/dL (ref >=50)
LDL Cholesterol (Calc): 125 mg/dL (calc) — ABNORMAL HIGH
Non-HDL Cholesterol (Calc): 153 mg/dL (calc) — ABNORMAL HIGH (ref <130)
Total CHOL/HDL Ratio: 3.9 (calc) (ref <5.0)
Triglycerides: 168 mg/dL — ABNORMAL HIGH (ref <150)

## 2021-06-28 LAB — CBC WITH DIFFERENTIAL/PLATELET

## 2021-06-28 LAB — TSH: TSH: 3.09 mIU/L (ref 0.40–4.50)

## 2021-09-24 ENCOUNTER — Other Ambulatory Visit: Payer: Self-pay | Admitting: Internal Medicine

## 2021-11-01 NOTE — Progress Notes (Unsigned)
MEDICARE ANNUAL WELLNESS VISIT AND 71MONTH FOLLOW UP   Assessment:   [name] was seen today for follow-up and medicare wellness.  Diagnoses and all orders for this visit:  Medicare annual wellness visit, subsequent Yearly  Essential hypertension Has lasix PRN Denies any edema Continue to monitor blood pressure CBC  Hyperlipidemia, mixed Statin Myopathy Continue zetia and fenofibrate Lipid  CMP  Hypothyroidism, unspecified type Taking levothyroxine 88 mcg daily except M,W,F takes 1/2 a pill Reminder to take on an empty stomach 30-66mns before first meal of the day. No antacid medications for 4 hours. TSH  Vitamin D deficiency Continue supplementation  Abnormal glucose Discussed dietary and exercise modifications  Insomnia, unspecified type Discussed melatonin nightly Decrease stimulation, screen time Exercise regularly  Depression, major in remission  No medicaitons Doing well at this time   Neuropathy Only occurring at nighttime- uses Gabapentin 300 mg at bedtime Is noticing if she gets up in the middle of the night she gets pins and needle sensation and has difficulty falling asleep- Continues Tylenol use which does control  Obesity Discussed dietary and exercise modifications  GERD Rx Famotadine 216mdaily or BID  Contack office if not resolved Consider Omeprazole short term treatment? Avoids triggers  Medication management continued    Over 30 minutes of non face to face interview, counseling, chart review and critical decision making was performed Future Appointments  Date Time Provider [place]3/21/2024 11:00 AM McUnk PintoMD [facility] None  07/03/2022 11:00 AM WiAlycia RossettiNP [facility] None     Plan:   During the course of the visit the patient was educated and counseled about appropriate screening and preventive services including:   Pneumococcal vaccine  Prevnar 13 Influenza vaccine Td vaccine Screening  electrocardiogram Bone densitometry screening Colorectal cancer screening Diabetes screening Glaucoma screening Nutrition counseling  Advanced directives: requested   Subjective:  [name] a [age].o. female who presents for Medicare Annual Wellness Visit and 3 month follow up for HTN, HLD, hypothyroidism, insomnia, weight and Vitamin D Deficiency.   Has also been using Gabapentin for pins/needles sensation in her feet which occurs only at bedtime.  This was controlling but she now notices if she wakes up at night to go to the bathroom the pain returns and she has difficulty falling back asleep  Has been using Tylenol which is helping.   She has been having occasional difficulty swallowing. It occurs with food and pills but is not happening regularly  BMI is Body mass index is 32.76 kg/m., she has been working on diet and exercise. She is limiting simple carbs, uses whole wheat. She does limit red meat.  Wt Readings from Last 3 Encounters:  11/04/21 173 lb 6.4 oz (78.7 kg)  06/27/21 170 lb 6.4 oz (77.3 kg)  05/02/21 169 lb (76.7 kg)  [facility]    . Marland Kitchener blood pressure has been controlled she has not checked her blood pressure since going to her daughters house in [place]. BP: 122/70 BP Readings from Last 3 Encounters:  11/04/21 122/70  06/27/21 120/62  05/02/21 126/82    She does workout. She denies chest pain, shortness of breath, dizziness.  She is on Zetia 10 mg QD . Had myalgias with Atorvastatin never tried Rosuvastatin- declines. Her cholesterol is not at goal. The cholesterol last visit was:   Lab Results  Component Value Date   CHOL 206 (H) 06/27/2021   HDL 53 06/27/2021  LDLCALC 125 (H) 06/27/2021   TRIG 168 (H) 06/27/2021   CHOLHDL 3.9 06/27/2021    . She has been working on diet and exercise, we monitor for prediabetes, and denies increased appetite, nausea, paresthesia of the feet, polydipsia, polyuria, visual  disturbances, vomiting and weight loss. Last A1C in the office was:  Lab Results  Component Value Date   HGBA1C 5.5 03/21/2021   Last GFR: Lab Results  Component Value Date   EGFR 91 06/27/2021    Patient is on Vitamin D supplement.   Lab Results  Component Value Date   VD25OH 100 03/21/2021      Medication Review: Current Outpatient Medications on File Prior to Visit  Medication Sig Dispense Refill   Acetaminophen (TYLENOL EXTRA STRENGTH PO) Take by mouth.     aspirin 81 MG tablet Take 81 mg by mouth daily.     Calcium Carbonate (CALCIUM 500 PO) Take 1 tablet by mouth daily.     Cholecalciferol (VITAMIN D PO) Take 2,000 Int'l Units by mouth 2 (two) times daily.      ezetimibe (ZETIA) 10 MG tablet Take       1 tablet      Daily       for Cholesterol 90 tablet 0   fenofibrate (TRICOR) 145 MG tablet Take 1 tablet Daily for Triglycerides (Blood Fats ) 90 tablet 3   furosemide (LASIX) 40 MG tablet TAKE 1 TABLET TWICE A DAY FOR BP & FLUID 180 tablet 1   gabapentin (NEURONTIN) 300 MG capsule TAKE 1 CAPSULE BY MOUTH EVERYDAY AT BEDTIME 90 capsule 2   GINKGO BILOBA PLUS PO Take 60 mg by mouth daily.     levothyroxine (SYNTHROID) 88 MCG tablet Take 88 mcg by mouth daily. 1/2 tab M W F and 1 tab the rest of the days     Magnesium 500 MG TABS Take by mouth.     Multiple Vitamins-Minerals (MULTIVITAMIN WITH MINERALS) tablet Take 1 tablet by mouth daily.     rosuvastatin (CRESTOR) 10 MG tablet Take  1 tablet  Daily  for Cholesterol (Patient taking differently: Take  1 tablet  Daily  for Cholesterol Takes 1 tablet every other day) 90 tablet 1   TURMERIC PO Take 1,500 mg by mouth daily.     vitamin C (ASCORBIC ACID) 500 MG tablet Take 1,000 mg by mouth daily. Takes 2,036m     azithromycin (ZITHROMAX) 250 MG tablet Take 2 tablets (500 mg) on  Day 1,  followed by 1 tablet (250 mg) once daily on Days 2 through 5. (Patient not taking: Reported on 06/27/2021) 6 each 1   dexamethasone (DECADRON) 1 MG  tablet Take 3 tabs for 3 days, 2 tabs for 3 days 1 tab for 5 days. Take with food. (Patient not taking: Reported on 06/27/2021) 20 tablet 0   famotidine (PEPCID) 20 MG tablet Take 1 tablet (20 mg total) by mouth daily. 90 tablet 1   No current facility-administered medications on file prior to visit.    Allergies  Allergen Reactions   Aspirin     REACTION: GI ulcers   Fish Oil     Allergic to fish oil and shell fish   Morphine And Related    Sulfa Antibiotics    Vicodin [Hydrocodone-Acetaminophen] Nausea Only    Current Problems (verified) Patient Active Problem List   Diagnosis Date Noted   Insomnia 05/29/2018   Hypothyroid 04/13/2017   Benign paroxysmal positional vertigo 02/07/2016   BMI 29.0-29.9,adult 09/21/2014  Encounter for Medicare annual wellness exam 09/21/2014   Medication management 08/15/2013   Abnormal glucose 01/26/2013   Vitamin D deficiency 12/13/2012   GERD    Hyperlipidemia, mixed    Depression, major, in remission ([place])    Essential hypertension 09/09/2008    Screening Tests Immunization History  Administered Date(s) Administered   Fluad Quad(high Dose 65+) 10/23/2019   Influenza, High Dose Seasonal PF 10/18/2014, 11/06/2015, 10/22/2017, 10/03/2021   Influenza-Unspecified 10/13/2016, 10/26/2019   PFIZER(Purple Top)SARS-COV-2 Vaccination 05/14/2019, 11/13/2019, 11/13/2019, 07/14/2020, 07/14/2020   Pfizer Covid-19 Vaccine Bivalent Booster 14yr & up 03/20/2021   Pneumococcal Conjugate-13 06/21/2014   Pneumococcal Polysaccharide-23 01/21/2012   Td 01/21/2012    Preventative care: [facility] Last mammogram: 01/02/21 DEXA: 2020 Normal  Prior vaccinations: TD or Tdap: 2014  Influenza: 11/26/20 Pneumococcal: 2014 Prevnar13: 2016 Shingles/Zostavax: Discussed with patient, Shingrix.    Names of Other Physician/Practitioners you currently use: 1. [facility]  here for primary care 2. [facility]. [facility] , [place]  Patient Care Team: MUnk Pinto MD as PCP - General (Internal Medicine) BLafayette Dragon MD (Inactive) as Consulting Physician (Gastroenterology)  SURGICAL HISTORY She  has a past surgical history that includes Back surgery; Appendectomy; Cesarean section; Abdominal hysterectomy (1981); and Salpingoophorectomy (1981 left). FAMILY HISTORY Her family history includes Diabetes in her mother; Heart disease in her father, mother, and sister; Hypertension in her father; Kidney disease in her mother. SOCIAL HISTORY She  reports that she quit smoking about 13 years ago. Her smoking use included cigarettes. She has never used smokeless tobacco. She reports that she does not drink alcohol and does not use drugs.   MEDICARE WELLNESS OBJECTIVES: Physical activity: Current Exercise Habits: Home exercise routine, Type of exercise: walking, Time (Minutes): 30, Frequency (Times/Week): 7, Weekly Exercise (Minutes/Week): 210, Intensity: Mild, Exercise limited by: None identified Cardiac risk factors: Cardiac Risk Factors include: advanced age (>532m, >6>6omen);dyslipidemia;smoking/ tobacco exposure;obesity (BMI >30kg/m2) Depression/mood screen:      11/04/2021   10:04 AM  Depression screen PHQ 2/9  Decreased Interest 0  Down, Depressed, Hopeless 0  PHQ - 2 Score 0    ADLs:     11/04/2021   10:06 AM 03/21/2021   12:21 AM  In your present state of health, do you have any difficulty performing the following activities:  Hearing? 0 0  Vision? 0 0  Difficulty concentrating or making decisions? 0 0  Walking or climbing stairs? 0 0  Dressing or bathing? 0 0  Doing errands, shopping? 0 0      Cognitive Testing  Alert? Yes  Normal Appearance?Yes  Oriented to person? Yes  Place? Yes   Time? Yes  Recall of three objects?  Yes  Can perform simple calculations? Yes  Displays appropriate  judgment?Yes  Can read the correct time from a watch face?Yes  EOL planning: Does Patient Have a Medical Advance Directive?: No Would patient like information on creating a medical advance directive?: No - Patient declined  Review of Systems  Constitutional:  Negative for chills, fever and weight loss.  HENT:  Negative for congestion and hearing loss.   Eyes:  Negative for blurred vision and double vision.  Respiratory:  Negative for cough, shortness of breath and wheezing.   Cardiovascular:  Negative for chest pain, palpitations, orthopnea and leg swelling.  Gastrointestinal:  Negative for abdominal pain, constipation, diarrhea, heartburn, nausea and  vomiting.       Occasional difficulty swallowing food and pills  Genitourinary:  Negative for dysuria.  Musculoskeletal:  Negative for falls, joint pain and myalgias.  Skin:  Negative for rash.  Neurological:  Positive for headaches (sinus related). Negative for dizziness, tingling, tremors and loss of consciousness.  Psychiatric/Behavioral:  Negative for depression, memory loss and suicidal ideas.      Objective:     Today's Vitals   11/04/21 0954  BP: 122/70  Pulse: 67  Resp: 17  Temp: 97.9 F (36.6 C)  SpO2: 94%  Weight: 173 lb 6.4 oz (78.7 kg)  Height: _0  (1.549 m)   Physical Exam:  BP 122/70   Pulse 67   Temp 97.9 F (36.6 C)   Resp 17   Ht _1  (1.549 m)   Wt 173 lb 6.4 oz (78.7 kg)   SpO2 94%   BMI 32.76 kg/m   General Appearance: Well nourished, in no apparent distress. Eyes: PERRLA, EOMs, conjunctiva no swelling or erythema Sinuses: No Frontal/maxillary tenderness ENT/Mouth: Ext aud canals clear, TMs without erythema, bulging. No erythema, swelling, or exudate on post pharynx.  Tonsils not swollen or erythematous. Hearing normal.  Neck: Supple, thyroid normal.  Respiratory: Respiratory effort normal, BS equal bilaterally without rales, rhonchi, wheezing or stridor.  Cardio: RRR with no MRGs. Brisk  peripheral pulses with scant non-pitting edema   Abdomen: Soft, obese/mildly distended, + BS.  Non tender, no guarding, rebound, hernias, masses. Lymphatics: Non tender without lymphadenopathy.  Musculoskeletal: Full ROM, 5/5 strength, normal gait.  Skin: Warm, dry without rashes, lesions; he has fragile skin and numerous small ecchymoses to bilateral upper extremities Neuro: Cranial nerves intact. Normal muscle tone, no cerebellar symptoms. Sensation intact.  Psych: Awake and oriented X 3, normal affect, Insight and Judgment appropriate.    Medicare Attestation I have personally reviewed: The patient's medical and social history Their use of alcohol, tobacco or illicit drugs Their current medications and supplements The patient's functional ability including ADLs,fall risks, home safety risks, cognitive, and hearing and visual impairment Diet and physical activities Evidence for depression or mood disorders  The patient's weight, height, BMI, and visual acuity have been recorded in the chart.  I have made referrals, counseling, and provided education to the patient based on review of the above and I have provided the patient with a written personalized care plan for preventive services.      Magda Bernheim ANP-C  [facility]  11/04/2021

## 2021-11-04 ENCOUNTER — Ambulatory Visit (INDEPENDENT_AMBULATORY_CARE_PROVIDER_SITE_OTHER): Payer: PPO | Admitting: Nurse Practitioner

## 2021-11-04 ENCOUNTER — Encounter: Payer: Self-pay | Admitting: Nurse Practitioner

## 2021-11-04 VITALS — BP 122/70 | HR 67 | Temp 97.9°F | Resp 17 | Ht 61.0 in | Wt 173.4 lb

## 2021-11-04 DIAGNOSIS — G720 Drug-induced myopathy: Secondary | ICD-10-CM | POA: Diagnosis not present

## 2021-11-04 DIAGNOSIS — Z79899 Other long term (current) drug therapy: Secondary | ICD-10-CM | POA: Diagnosis not present

## 2021-11-04 DIAGNOSIS — G629 Polyneuropathy, unspecified: Secondary | ICD-10-CM

## 2021-11-04 DIAGNOSIS — Z6831 Body mass index (BMI) 31.0-31.9, adult: Secondary | ICD-10-CM | POA: Diagnosis not present

## 2021-11-04 DIAGNOSIS — E782 Mixed hyperlipidemia: Secondary | ICD-10-CM

## 2021-11-04 DIAGNOSIS — K2101 Gastro-esophageal reflux disease with esophagitis, with bleeding: Secondary | ICD-10-CM | POA: Diagnosis not present

## 2021-11-04 DIAGNOSIS — E039 Hypothyroidism, unspecified: Secondary | ICD-10-CM | POA: Diagnosis not present

## 2021-11-04 DIAGNOSIS — I10 Essential (primary) hypertension: Secondary | ICD-10-CM

## 2021-11-04 DIAGNOSIS — Z0000 Encounter for general adult medical examination without abnormal findings: Secondary | ICD-10-CM

## 2021-11-04 DIAGNOSIS — Z0001 Encounter for general adult medical examination with abnormal findings: Secondary | ICD-10-CM | POA: Diagnosis not present

## 2021-11-04 DIAGNOSIS — G4700 Insomnia, unspecified: Secondary | ICD-10-CM | POA: Diagnosis not present

## 2021-11-04 DIAGNOSIS — R6889 Other general symptoms and signs: Secondary | ICD-10-CM | POA: Diagnosis not present

## 2021-11-04 DIAGNOSIS — T466X5A Adverse effect of antihyperlipidemic and antiarteriosclerotic drugs, initial encounter: Secondary | ICD-10-CM

## 2021-11-04 DIAGNOSIS — E6609 Other obesity due to excess calories: Secondary | ICD-10-CM

## 2021-11-04 DIAGNOSIS — R7309 Other abnormal glucose: Secondary | ICD-10-CM

## 2021-11-04 DIAGNOSIS — E559 Vitamin D deficiency, unspecified: Secondary | ICD-10-CM

## 2021-11-04 DIAGNOSIS — F325 Major depressive disorder, single episode, in full remission: Secondary | ICD-10-CM

## 2021-11-04 NOTE — Patient Instructions (Signed)

## 2021-11-05 LAB — CBC WITH DIFFERENTIAL/PLATELET
Absolute Monocytes: 341 cells/uL (ref 200–950)
Basophils Absolute: 48 cells/uL (ref 0–200)
Basophils Relative: 1 %
Eosinophils Absolute: 139 cells/uL (ref 15–500)
Eosinophils Relative: 2.9 %
HCT: 39.4 % (ref 35.0–45.0)
Hemoglobin: 13.3 g/dL (ref 11.7–15.5)
Lymphs Abs: 1848 cells/uL (ref 850–3900)
MCH: 30.3 pg (ref 27.0–33.0)
MCHC: 33.8 g/dL (ref 32.0–36.0)
MCV: 89.7 fL (ref 80.0–100.0)
MPV: 10.6 fL (ref 7.5–12.5)
Monocytes Relative: 7.1 %
Neutro Abs: 2424 cells/uL (ref 1500–7800)
Neutrophils Relative %: 50.5 %
Platelets: 320 10*3/uL (ref 140–400)
RBC: 4.39 10*6/uL (ref 3.80–5.10)
RDW: 12.3 % (ref 11.0–15.0)
Total Lymphocyte: 38.5 %
WBC: 4.8 10*3/uL (ref 3.8–10.8)

## 2021-11-05 LAB — COMPLETE METABOLIC PANEL WITH GFR
AG Ratio: 1.8 (calc) (ref 1.0–2.5)
ALT: 16 U/L (ref 6–29)
AST: 20 U/L (ref 10–35)
Albumin: 4.8 g/dL (ref 3.6–5.1)
Alkaline phosphatase (APISO): 54 U/L (ref 37–153)
BUN: 16 mg/dL (ref 7–25)
CO2: 29 mmol/L (ref 20–32)
Calcium: 10.3 mg/dL (ref 8.6–10.4)
Chloride: 104 mmol/L (ref 98–110)
Creat: 0.77 mg/dL (ref 0.60–1.00)
Globulin: 2.6 g/dL (calc) (ref 1.9–3.7)
Glucose, Bld: 92 mg/dL (ref 65–99)
Potassium: 4.8 mmol/L (ref 3.5–5.3)
Sodium: 145 mmol/L (ref 135–146)
Total Bilirubin: 0.5 mg/dL (ref 0.2–1.2)
Total Protein: 7.4 g/dL (ref 6.1–8.1)
eGFR: 80 mL/min/{1.73_m2} (ref >=60)

## 2021-11-05 LAB — LIPID PANEL
Cholesterol: 241 mg/dL — ABNORMAL HIGH (ref <200)
HDL: 42 mg/dL — ABNORMAL LOW (ref >=50)
LDL Cholesterol (Calc): 151 mg/dL (calc) — ABNORMAL HIGH
Non-HDL Cholesterol (Calc): 199 mg/dL (calc) — ABNORMAL HIGH (ref <130)
Total CHOL/HDL Ratio: 5.7 (calc) — ABNORMAL HIGH (ref <5.0)
Triglycerides: 290 mg/dL — ABNORMAL HIGH (ref <150)

## 2021-11-05 LAB — TSH: TSH: 3.7 mIU/L (ref 0.40–4.50)

## 2021-11-25 ENCOUNTER — Other Ambulatory Visit: Payer: Self-pay

## 2021-11-25 ENCOUNTER — Telehealth: Payer: Self-pay | Admitting: Nurse Practitioner

## 2021-11-25 ENCOUNTER — Other Ambulatory Visit: Payer: Self-pay | Admitting: Nurse Practitioner

## 2021-11-25 DIAGNOSIS — I10 Essential (primary) hypertension: Secondary | ICD-10-CM

## 2021-11-25 MED ORDER — FUROSEMIDE 40 MG PO TABS: ORAL_TABLET | ORAL | 1 refills | Status: DC

## 2021-11-25 NOTE — Telephone Encounter (Signed)
Patient is requesting a refill on Furosemide to [facility] on [address]

## 2022-01-08 NOTE — Progress Notes (Unsigned)
     Future Appointments  Date Time Provider Department  01/09/2022  9:30 AM Unk Pinto, MD [facility]  03/27/2022 11:00 AM Unk Pinto, MD [facility]  07/03/2022 11:00 AM Alycia Rossetti, NP [facility]    History of Present Illness:  This very nice 77 y.o. [place]  with HTN, HLD, Prediabetes and Vitamin D Deficiency who presents who presents with c/o dry cough, hoarseness, laryngitis    Medications  Current Outpatient Medications (Endocrine & Metabolic):    levothyroxine (SYNTHROID) 88 MCG tablet, Take 88 mcg by mouth daily. 1/2 tab M W F and 1 tab the rest of the days  Current Outpatient Medications (Cardiovascular):    ezetimibe (ZETIA) 10 MG tablet, Take       1 tablet      Daily       for Cholesterol   fenofibrate (TRICOR) 145 MG tablet, Take 1 tablet Daily for Triglycerides (Blood Fats )   furosemide (LASIX) 40 MG tablet, TAKE 1 TABLET TWICE A DAY FOR BP & FLUID   rosuvastatin (CRESTOR) 10 MG tablet, Take  1 tablet  Daily  for Cholesterol (Patient taking differently: Take  1 tablet  Daily  for Cholesterol Takes 1 tablet every other day)   Current Outpatient Medications (Analgesics):    Acetaminophen (TYLENOL EXTRA STRENGTH PO), Take by mouth.   aspirin 81 MG tablet, Take 81 mg by mouth daily.   Current Outpatient Medications (Other):    Calcium Carbonate (CALCIUM 500 PO), Take 1 tablet by mouth daily.   Cholecalciferol (VITAMIN D PO), Take 2,000 Int'l Units by mouth 2 (two) times daily.    famotidine (PEPCID) 20 MG tablet, Take 1 tablet (20 mg total) by mouth daily.   gabapentin (NEURONTIN) 300 MG capsule, TAKE 1 CAPSULE BY MOUTH EVERYDAY AT BEDTIME   GINKGO BILOBA PLUS PO, Take 60 mg by mouth daily.   Magnesium 500 MG TABS, Take by mouth.   Multiple Vitamins-Minerals (MULTIVITAMIN WITH MINERALS) tablet, Take 1 tablet by mouth daily.   TURMERIC PO, Take 1,500 mg by mouth daily.   vitamin C (ASCORBIC ACID) 500 MG tablet, Take 1,000 mg by mouth daily. Takes  2,'000mg'$   Problem list She has Essential hypertension; GERD; Hyperlipidemia, mixed; Depression, major, in remission ([place]); Vitamin D deficiency; Abnormal glucose; Medication management; BMI 29.0-29.9,adult; Encounter for Medicare annual wellness exam; Benign paroxysmal positional vertigo; Hypothyroid; and Insomnia on their problem list.   Observations/Objective:  There were no vitals taken for this visit.  HEENT - WNL. Neck - supple.  Chest - Clear equal BS. Cor - Nl HS. RRR w/o sig MGR. PP 1(+). No edema. MS- FROM w/o deformities.  Gait Nl. Neuro -  Nl w/o focal abnormalities.   Assessment and Plan:      Follow Up Instructions:        I discussed the assessment and treatment plan with the patient. The patient was provided an opportunity to ask questions and all were answered. The patient agreed with the plan and demonstrated an understanding of the instructions.       The patient was advised to call back or seek an in-person evaluation if the symptoms worsen or if the condition fails to improve as anticipated.    Kirtland Bouchard, MD

## 2022-01-09 ENCOUNTER — Ambulatory Visit (INDEPENDENT_AMBULATORY_CARE_PROVIDER_SITE_OTHER): Payer: PPO | Admitting: Internal Medicine

## 2022-01-09 ENCOUNTER — Encounter: Payer: Self-pay | Admitting: Internal Medicine

## 2022-01-09 ENCOUNTER — Other Ambulatory Visit: Payer: Self-pay

## 2022-01-09 VITALS — BP 128/78 | HR 77 | Temp 98.0°F | Resp 18 | Ht 61.0 in | Wt 174.4 lb

## 2022-01-09 DIAGNOSIS — G4483 Primary cough headache: Secondary | ICD-10-CM | POA: Diagnosis not present

## 2022-01-09 DIAGNOSIS — J042 Acute laryngotracheitis: Secondary | ICD-10-CM | POA: Diagnosis not present

## 2022-01-09 LAB — POC INFLUENZA A&B (BINAX/QUICKVUE)
Influenza A, POC: NEGATIVE
Influenza B, POC: NEGATIVE

## 2022-01-09 LAB — POC COVID19 BINAXNOW: SARS Coronavirus 2 Ag: NEGATIVE

## 2022-01-09 MED ORDER — PSEUDOEPHEDRINE HCL ER 120 MG PO TB12: ORAL_TABLET | ORAL | 2 refills | Status: DC

## 2022-01-09 MED ORDER — DEXAMETHASONE 4 MG PO TABS: ORAL_TABLET | ORAL | 0 refills | Status: DC

## 2022-01-09 MED ORDER — BENZONATATE 200 MG PO CAPS: ORAL_CAPSULE | ORAL | 1 refills | Status: DC

## 2022-01-09 NOTE — Patient Instructions (Signed)

## 2022-02-20 ENCOUNTER — Other Ambulatory Visit: Payer: Self-pay | Admitting: Internal Medicine

## 2022-02-20 DIAGNOSIS — E782 Mixed hyperlipidemia: Secondary | ICD-10-CM

## 2022-03-26 ENCOUNTER — Encounter: Payer: Self-pay | Admitting: Internal Medicine

## 2022-03-26 NOTE — Patient Instructions (Signed)

## 2022-03-26 NOTE — Progress Notes (Unsigned)
Annual  Screening/Preventative Visit  & Comprehensive Evaluation & Examination   Future Appointments  Date Time Provider Department  03/27/2022                  cpe 11:00 AM Unk Pinto, MD [facility]  6/Kim/2024                 wellness 11:00 AM Alycia Rossetti, NP [facility]  3/Kim/2025                  cpe 11:00 AM Unk Pinto, MD [facility]         This very nice 77 y.o. [name] presents for a Screening /Preventative Visit & comprehensive evaluation and management of multiple medical co-morbidities.  Patient has been followed for HTN, HLD, Prediabetes and Vitamin D Deficiency.         HTN predates since 1995. Patient's BP has been controlled and today's BP is at goal - 130/82.   Patient denies any cardiac symptoms as chest pain, palpitations, shortness of breath, dizziness or ankle swelling.        Patient's hyperlipidemia is not controlled with  diet and Ezetimibe /Fenofibrate as she has been off of her Rosuvastatin.   Patient denies myalgias or other medication SE's. Last lipids were not at goal :  Lab Results  Component Value Date   CHOL 241 (H) 11/04/2021   HDL 42 (L) 11/04/2021   LDLCALC 151 (H) 11/04/2021   TRIG 290 (H) 11/04/2021   CHOLHDL 5.7 (H) 11/04/2021         Patient has hx/o prediabetes (A1c 5.7% /2011) and patient denies reactive hypoglycemic symptoms, visual blurring, diabetic polys or paresthesias. Last A1c was normal & at goal :   Lab Results  Component Value Date   HGBA1C 5.5 03/21/2021                                                             Patient was dx'd Hypothyroid  in 2009  & has been on thyroid replacement since.             Finally, patient has history of Vitamin D Deficiency ("40"/2008) and last vitamin D was at goal :   Lab Results  Component Value Date   VD25OH 100 03/21/2021       Current Outpatient Medications on File Prior to Visit  Medication Sig   Acetaminophen EXTRA STRENGTH  Take    albuterol HFA inhaler  Inhale 2 puffs every 6  hours as needed    aspirin 81 MG tablet Take daily.   CALCIUM 500  Take 1 tablet  daily.   VITAMIN D 2,000 Units  Take 2 times daily.    Cyclobenzaprine 10 MG tablet Take 1/2 to 1 tablets 2 to 3 x / day if needed    ezetimibe 10 MG tablet Take  1 tablet  Daily   famotidine  20 MG tablet Take 1 tablet daily.   fenofibrate 145 MG tablet Take 1 tablet Daily    furosemide 40 MG tablet TAKE 1 TABLET TWICE A DAY    gabapentin  300 MG capsule Take at bedtime.   GINKGO BILOBA PLUS  60 mg  Take daily.   levothyroxine 75 MCG tablet Take  1 tablet  Daily  Magnesium 500 MG TABS Take    Multiple Vitamins-Minerals  Take 1 tablet daily.    -  NOT TAKING    TURMERIC 1,500 mg Take daily.   vitamin C  500 MG tablet Take 1,000 mg  daily.      Allergies  Allergen Reactions   Aspirin     REACTION: GI ulcers   Fish Oil     Allergic to fish oil and shell fish   Morphine And Related    Sulfa Antibiotics    Vicodin [Hydrocodone] Nausea Only     Past Medical History:  Diagnosis Date   Vitamin D deficiency      Health Maintenance  Topic Date Due   Zoster Vaccines- Shingrix (1 of 2) Never done   INFLUENZA VACCINE  08/06/2020   COVID-19 Vaccine (4 - Booster for Pfizer series) 09/08/2020   TETANUS/TDAP  01/20/2022   Pneumonia Vaccine 48+ Years old  Completed   DEXA SCAN  Completed   Hepatitis C Screening  Completed   HPV VACCINES  Aged Out     Immunization History  Administered Date(s) Administered   Fluad Quad(high Dose ) 10/23/2019   Influenza, High Dose  10/18/2014, 11/06/2015, 10/22/2017   Influenza 10/13/2016, 10/26/2019   PFIZER SARS-COV-2 Vacc 11/13/2019, 07/14/2020, 07/14/2020   Pneumococcal -13 06/21/2014   Pneumococcal -23 01/21/2012   Td 01/21/2012    Last Colon - 2020 in [place] with 4 polyps.   Past Surgical History:  Procedure Laterality Date   ABDOMINAL HYSTERECTOMY  1981   partial   APPENDECTOMY     BACK SURGERY     CESAREAN  SECTION     SALPINGOOPHORECTOMY  1981 left   1982 right     Family History  Problem Relation Age of Onset   Diabetes Mother    Kidney disease Mother    Heart disease Mother    Hypertension Father    Heart disease Father    Heart disease Sister      Social History   Tobacco Use   Smoking status: Former    Types: Cigarettes    Quit date: 01/07/2008    Years since quitting: 13.2   Smokeless tobacco: Never  Substance Use Topics   Alcohol use: No   Drug use: No      ROS Constitutional: Denies fever, chills, weight loss/gain, headaches, insomnia,  night sweats or change in appetite. Does c/o fatigue. Eyes: Denies redness, blurred vision, diplopia, discharge, itchy or watery eyes.  ENT: Denies discharge, congestion, post nasal drip, epistaxis, sore throat, earache, hearing loss, dental pain, Tinnitus, Vertigo, Sinus pain or snoring.  Cardio: Denies chest pain, palpitations, irregular heartbeat, syncope, dyspnea, diaphoresis, orthopnea, PND, claudication or edema Respiratory: denies cough, dyspnea, DOE, pleurisy, hoarseness, laryngitis or wheezing.  Gastrointestinal: Denies dysphagia, heartburn, reflux, water brash, pain, cramps, nausea, vomiting, bloating, diarrhea, constipation, hematemesis, melena, hematochezia, jaundice or hemorrhoids Genitourinary: Denies dysuria, frequency, urgency, nocturia, hesitancy, discharge, hematuria or flank pain Musculoskeletal: Denies arthralgia, myalgia, stiffness, Jt. Swelling, pain, limp or strain/sprain. Denies Falls. Skin: Denies puritis, rash, hives, warts, acne, eczema or change in skin lesion Neuro: No weakness, tremor, incoordination, spasms, paresthesia or pain Psychiatric: Denies confusion, memory loss or sensory loss. Denies Depression. Endocrine: Denies change in weight, skin, hair change, nocturia, and paresthesia, diabetic polys, visual blurring or hyper / hypo glycemic episodes.  Heme/Lymph: No excessive bleeding, bruising or enlarged  lymph nodes.   Physical Exam  BP 130/82   Pulse 66   Temp (!) 97.3  F (36.3 C)   Resp 16   Ht 5\' 1"  (1.549 m)   Wt 169 lb 3.2 oz (76.7 kg)   SpO2 98%   BMI 31.97 kg/m   General Appearance: Over nourished and well groomed and in no apparent distress.  Eyes: PERRLA, EOMs, conjunctiva no swelling or erythema, normal fundi and vessels. Sinuses: No frontal/maxillary tenderness ENT/Mouth: EACs patent / TMs  nl. Nares clear without erythema, swelling, mucoid exudates. Oral hygiene is good. No erythema, swelling, or exudate. Tongue normal, non-obstructing. Tonsils not swollen or erythematous. Hearing normal.  Neck: Supple, thyroid not palpable. No bruits, nodes or JVD. Respiratory: Respiratory effort normal.  BS equal and clear bilateral without rales, rhonci, wheezing or stridor. Cardio: Heart sounds are normal with regular rate and rhythm and no murmurs, rubs or gallops. Peripheral pulses are normal and equal bilaterally without edema. No aortic or femoral bruits. Chest: symmetric with normal excursions and percussion.  Abdomen: Soft, with Nl bowel sounds. Nontender, no guarding, rebound, hernias, masses, or organomegaly.  Lymphatics: Non tender without lymphadenopathy.  Musculoskeletal: Full ROM all peripheral extremities, joint stability, 5/5 strength, and normal gait. Skin: Warm and dry without rashes, lesions, cyanosis, clubbing or  ecchymosis.  Neuro: Cranial nerves intact, reflexes equal bilaterally. Normal muscle tone, no cerebellar symptoms. Sensation intact.  Pysch: Alert and oriented X 3 with normal affect, insight and judgment appropriate.   Assessment and Plan  1. Annual Preventative/Screening Exam    2. Essential hypertension  - EKG 12-Lead - CBC with Differential/Platelet - COMPLETE METABOLIC PANEL WITH GFR - Magnesium - TSH  3. Hyperlipidemia, mixed  - EKG 12-Lead - Lipid panel - TSH  4. Abnormal glucose  - EKG 12-Lead - Hemoglobin A1c - Insulin,  random  5. Vitamin D deficiency  - VITAMIN D 25 Hydroxy   6. Hypothyroidism  - TSH  7. Statin myopathy   8. Screening for colorectal cancer  - POC Hemoccult Bld/Stl   9. Screening for heart disease  - EKG 12-Lead  10. FHx: heart disease  - EKG 12-Lead  11. Medication management  - Urinalysis, Routine w reflex microscopic - Microalbumin / creatinine urine ratio - CBC with Differential/Platelet - COMPLETE METABOLIC PANEL WITH GFR - Magnesium - Lipid panel - TSH - Hemoglobin A1c - Insulin, random - VITAMIN D 25 Hydroxy         Patient was counseled in prudent diet, weight control to achieve/maintain BMI less than 25, BP monitoring, regular exercise and medications as discussed.  Discussed med effects and SE's. Routine screening labs and tests as requested with regular follow-up as recommended. Over 40 minutes of exam, counseling, chart review and high complex critical decision making was performed   Kirtland Bouchard, MD

## 2022-03-27 ENCOUNTER — Telehealth: Payer: Self-pay | Admitting: Internal Medicine

## 2022-03-27 ENCOUNTER — Encounter: Payer: Self-pay | Admitting: Internal Medicine

## 2022-03-27 ENCOUNTER — Other Ambulatory Visit: Payer: Self-pay

## 2022-03-27 ENCOUNTER — Ambulatory Visit (INDEPENDENT_AMBULATORY_CARE_PROVIDER_SITE_OTHER): Payer: PPO | Admitting: Internal Medicine

## 2022-03-27 VITALS — BP 130/82 | HR 66 | Temp 97.3°F | Resp 16 | Ht 61.0 in | Wt 169.2 lb

## 2022-03-27 DIAGNOSIS — Z0001 Encounter for general adult medical examination with abnormal findings: Secondary | ICD-10-CM

## 2022-03-27 DIAGNOSIS — I10 Essential (primary) hypertension: Secondary | ICD-10-CM | POA: Diagnosis not present

## 2022-03-27 DIAGNOSIS — E782 Mixed hyperlipidemia: Secondary | ICD-10-CM

## 2022-03-27 DIAGNOSIS — E559 Vitamin D deficiency, unspecified: Secondary | ICD-10-CM

## 2022-03-27 DIAGNOSIS — E039 Hypothyroidism, unspecified: Secondary | ICD-10-CM

## 2022-03-27 DIAGNOSIS — R7309 Other abnormal glucose: Secondary | ICD-10-CM

## 2022-03-27 DIAGNOSIS — K2100 Gastro-esophageal reflux disease with esophagitis, without bleeding: Secondary | ICD-10-CM

## 2022-03-27 DIAGNOSIS — T466X5A Adverse effect of antihyperlipidemic and antiarteriosclerotic drugs, initial encounter: Secondary | ICD-10-CM

## 2022-03-27 DIAGNOSIS — Z0000 Encounter for general adult medical examination without abnormal findings: Secondary | ICD-10-CM | POA: Diagnosis not present

## 2022-03-27 DIAGNOSIS — Z136 Encounter for screening for cardiovascular disorders: Secondary | ICD-10-CM | POA: Diagnosis not present

## 2022-03-27 DIAGNOSIS — Z1211 Encounter for screening for malignant neoplasm of colon: Secondary | ICD-10-CM

## 2022-03-27 DIAGNOSIS — Z79899 Other long term (current) drug therapy: Secondary | ICD-10-CM | POA: Diagnosis not present

## 2022-03-27 DIAGNOSIS — Z8249 Family history of ischemic heart disease and other diseases of the circulatory system: Secondary | ICD-10-CM

## 2022-03-27 MED ORDER — EZETIMIBE 10 MG PO TABS: ORAL_TABLET | ORAL | 3 refills | Status: DC

## 2022-03-27 NOTE — Telephone Encounter (Signed)
Pt is needing a new prescription for zetia to be sent to the [facility] [phone]

## 2022-03-28 ENCOUNTER — Other Ambulatory Visit: Payer: Self-pay | Admitting: Internal Medicine

## 2022-03-28 LAB — COMPLETE METABOLIC PANEL WITH GFR
AG Ratio: 2 (calc) (ref 1.0–2.5)
ALT: 19 U/L (ref 6–29)
AST: 25 U/L (ref 10–35)
Albumin: 5.1 g/dL (ref 3.6–5.1)
Alkaline phosphatase (APISO): 61 U/L (ref 37–153)
BUN: 13 mg/dL (ref 7–25)
CO2: 31 mmol/L (ref 20–32)
Calcium: 10.2 mg/dL (ref 8.6–10.4)
Chloride: 100 mmol/L (ref 98–110)
Creat: 0.67 mg/dL (ref 0.60–1.00)
Globulin: 2.5 g/dL (calc) (ref 1.9–3.7)
Glucose, Bld: 96 mg/dL (ref 65–99)
Potassium: 3.9 mmol/L (ref 3.5–5.3)
Sodium: 144 mmol/L (ref 135–146)
Total Bilirubin: 0.5 mg/dL (ref 0.2–1.2)
Total Protein: 7.6 g/dL (ref 6.1–8.1)
eGFR: 90 mL/min/{1.73_m2} (ref >=60)

## 2022-03-28 LAB — CBC WITH DIFFERENTIAL/PLATELET
Absolute Monocytes: 308 cells/uL (ref 200–950)
Basophils Absolute: 40 cells/uL (ref 0–200)
Basophils Relative: 0.9 %
Eosinophils Absolute: 62 cells/uL (ref 15–500)
Eosinophils Relative: 1.4 %
HCT: 41.9 % (ref 35.0–45.0)
Hemoglobin: 14 g/dL (ref 11.7–15.5)
Lymphs Abs: 1408 cells/uL (ref 850–3900)
MCH: 30.2 pg (ref 27.0–33.0)
MCHC: 33.4 g/dL (ref 32.0–36.0)
MCV: 90.3 fL (ref 80.0–100.0)
MPV: 10.6 fL (ref 7.5–12.5)
Monocytes Relative: 7 %
Neutro Abs: 2583 cells/uL (ref 1500–7800)
Neutrophils Relative %: 58.7 %
Platelets: 345 10*3/uL (ref 140–400)
RBC: 4.64 10*6/uL (ref 3.80–5.10)
RDW: 12.9 % (ref 11.0–15.0)
Total Lymphocyte: 32 %
WBC: 4.4 10*3/uL (ref 3.8–10.8)

## 2022-03-28 LAB — URINALYSIS, ROUTINE W REFLEX MICROSCOPIC
Bacteria, UA: NONE SEEN /HPF
Bilirubin Urine: NEGATIVE
Glucose, UA: NEGATIVE
Hgb urine dipstick: NEGATIVE
Hyaline Cast: NONE SEEN /LPF
Ketones, ur: NEGATIVE
Nitrite: NEGATIVE
Protein, ur: NEGATIVE
RBC / HPF: NONE SEEN /HPF (ref 0–2)
Specific Gravity, Urine: 1.003 (ref 1.001–1.035)
Squamous Epithelial / HPF: NONE SEEN /HPF (ref <=5)
pH: 7.5 (ref 5.0–8.0)

## 2022-03-28 LAB — LIPID PANEL
Cholesterol: 249 mg/dL — ABNORMAL HIGH (ref <200)
HDL: 44 mg/dL — ABNORMAL LOW (ref >=50)
LDL Cholesterol (Calc): 162 mg/dL (calc) — ABNORMAL HIGH
Non-HDL Cholesterol (Calc): 205 mg/dL (calc) — ABNORMAL HIGH (ref <130)
Total CHOL/HDL Ratio: 5.7 (calc) — ABNORMAL HIGH (ref <5.0)
Triglycerides: 264 mg/dL — ABNORMAL HIGH (ref <150)

## 2022-03-28 LAB — MICROALBUMIN / CREATININE URINE RATIO
Creatinine, Urine: 5 mg/dL — ABNORMAL LOW (ref 20–275)
Microalb Creat Ratio: 80 mg/g creat — ABNORMAL HIGH (ref <30)
Microalb, Ur: 0.4 mg/dL

## 2022-03-28 LAB — VITAMIN D 25 HYDROXY (VIT D DEFICIENCY, FRACTURES): Vit D, 25-Hydroxy: 94 ng/mL (ref 30–100)

## 2022-03-28 LAB — INSULIN, RANDOM: Insulin: 11.9 u[IU]/mL

## 2022-03-28 LAB — HEMOGLOBIN A1C
Hgb A1c MFr Bld: 5.5 % of total Hgb (ref <5.7)
Mean Plasma Glucose: 111 mg/dL
eAG (mmol/L): 6.2 mmol/L

## 2022-03-28 LAB — MAGNESIUM: Magnesium: 2 mg/dL (ref 1.5–2.5)

## 2022-03-28 LAB — TSH: TSH: 2.21 mIU/L (ref 0.40–4.50)

## 2022-03-28 NOTE — Progress Notes (Signed)
<><><><><><><><><><><><><><><><><><><><><><><><><><><><><><><><><> <><><><><><><><><><><><><><><><><><><><><><><><><><><><><><><><><> - Test results slightly outside the reference range are not unusual. If there is anything important, I will review this with you,  otherwise it is considered normal test values.  If you have further questions,  please do not hesitate to contact me at the office or via My Chart.  <><><><><><><><><><><><><><><><><><><><><><><><><><><><><><><><><> <><><><><><><><><><><><><><><><><><><><><><><><><><><><><><><><><>  -  Total Chol = 249     is very high risk for Heart Attack /Stroke /Vascular Dementia     ( Ideal or Goal is less than 180 ! )  & - Bad /Dangerous LDL Chol =  162    - - >> Sitting on a time Bomb !     ( Ideal or Goal is less than 70 ! )    - The cause is Bad Diet !  - Please Re- start your cholesterol meds (Rosuvastatin) &                                                                                                Take every day   - Read or listen to   Dr Alden Benjamin 's book    " How Not to Die ! "    - Recommend a stricter plant based low cholesterol diet   - Cholesterol only comes from animal sources                                                                                   - ie. meat, dairy, egg yolks  - Eat all the vegetables you want.  - Avoid Meat, Avoid Meat , Avoid Meat  ! ! !                                                                  -especially red meat - Beef AND Pork  - Avoid cheese & dairy - milk & ice cream.   - Cheese is the most concentrated form of trans-fats which                                                              is the worst thing to clog up our arteries.   - Veggie cheese is OK which can be found in  the fresh produce section at                                                          [facility] or [employer] or  [employer] <><><><><><><><><><><><><><><><><><><><><><><><><><><><><><><><><> <><><><><><><><><><><><><><><><><><><><><><><><><><><><><><><><><>  -  Also Triglycerides ( = 264  ) or fats in blood are too high                 (   Ideal or  Goal is less than 150  !  )    - Recommend avoid fried & greasy foods,  sweets / candy,   - Avoid white rice  (brown or wild rice or Quinoa is OK),   - Avoid white potatoes  (sweet potatoes are OK)   - Avoid anything made from white flour  - bagels, doughnuts, rolls, buns, biscuits, white and   wheat breads, pizza crust and traditional  pasta made of white flour & egg white  - (vegetarian pasta or spinach or wheat pasta is OK).    - Multi-grain bread is OK - like multi-grain flat bread or  sandwich thins.   - Avoid alcohol in excess.   - Exercise is also important. <><><><><><><><><><><><><><><><><><><><><><><><><><><><><><><><><> <><><><><><><><><><><><><><><><><><><><><><><><><><><><><><><><><>  -  A1c - Normal - No Diabetes  -  Great ! <><><><><><><><><><><><><><><><><><><><><><><><><><><><><><><><><> <><><><><><><><><><><><><><><><><><><><><><><><><><><><><><><><><>  -  Vitamin D = 94   - Excellent   - Please keep dose same  <><><><><><><><><><><><><><><><><><><><><><><><><><><><><><><><><>  -  All Else - CBC - Kidneys - Electrolytes - Liver - Magnesium & Thyroid    - all  Normal / OK <><><><><><><><><><><><><><><><><><><><><><><><><><><><><><><><><> <><><><><><><><><><><><><><><><><><><><><><><><><><><><><><><><><>

## 2022-04-08 ENCOUNTER — Ambulatory Visit
Admission: RE | Admit: 2022-04-08 | Discharge: 2022-04-08 | Disposition: A | Discharge status: Home / Self Care | Payer: PPO | Source: Ambulatory Visit | Attending: Internal Medicine | Admitting: Internal Medicine

## 2022-04-08 ENCOUNTER — Other Ambulatory Visit: Payer: Self-pay | Admitting: Internal Medicine

## 2022-04-08 DIAGNOSIS — Z1231 Encounter for screening mammogram for malignant neoplasm of breast: Secondary | ICD-10-CM | POA: Diagnosis not present

## 2022-05-08 ENCOUNTER — Other Ambulatory Visit: Payer: Self-pay

## 2022-05-08 DIAGNOSIS — Z1211 Encounter for screening for malignant neoplasm of colon: Secondary | ICD-10-CM

## 2022-05-08 DIAGNOSIS — Z1212 Encounter for screening for malignant neoplasm of rectum: Secondary | ICD-10-CM

## 2022-05-08 LAB — POC HEMOCCULT BLD/STL (HOME/3-CARD/SCREEN)
Card #2 Fecal Occult Blod, POC: NEGATIVE
Card #3 Fecal Occult Blood, POC: NEGATIVE
Fecal Occult Blood, POC: NEGATIVE

## 2022-06-09 ENCOUNTER — Other Ambulatory Visit: Payer: Self-pay | Admitting: Nurse Practitioner

## 2022-06-09 DIAGNOSIS — I10 Essential (primary) hypertension: Secondary | ICD-10-CM

## 2022-07-02 NOTE — Progress Notes (Unsigned)
MEDICARE ANNUAL WELLNESS VISIT AND FOLLOW UP   Assessment:   [name] was seen today for follow-up and medicare wellness.  Diagnoses and all orders for this visit:  Medicare annual wellness visit, subsequent Yearly Mammogram UTD 04/08/22 negative  Essential hypertension Has lasix PRN Denies any edema Continue to monitor blood pressure CBC  Hyperlipidemia, mixed Statin Myopathy Continue zetia and fenofibrate Lipid  CMP  Hypothyroidism, unspecified type Taking levothyroxine 88 mcg daily except M,W,F takes 1/2 a pill Reminder to take on an empty stomach 30-34mins before first meal of the day. No antacid medications for 4 hours. TSH  Vitamin D deficiency Continue supplementation  Abnormal glucose Discussed dietary and exercise modifications  Insomnia, unspecified type Discussed melatonin nightly Decrease stimulation, screen time Exercise regularly  Depression, major in remission  No medicaitons Doing well at this time  Neuropathy Only occurring at nighttime- uses Gabapentin 300 mg at bedtime Is noticing if she gets up in the middle of the night she gets pins and needle sensation and has difficulty falling asleep- Continues Tylenol use which does control  Obesity  Long discussion about weight loss, diet, and exercise Recommended diet heavy in fruits and veggies and low in animal meats, cheeses, and dairy products, appropriate calorie intake Patient will work on increasing lean proteins and reducing saturated fats and simple carbs Follow up at next visit   GERD Resolved and no longer taking medication Avoids triggers  Medication management -     CBC with Differential/Platelet -     COMPLETE METABOLIC PANEL WITH GFR -     Lipid panel -     TSH  Acute hip pain, right Use heat, tylenol, rest and complete dexmethasone taper If pain persists notify the office and will refer to ortho -     dexamethasone (DECADRON) 4 MG tablet; Take 3 tabs for 1 day, 2  tabs for 1 day 1 tab for 3 days. Take with food.  Acute pain left shoulder Heat, rest and tylenol If pain persist notify the office    Over 30 minutes of non face to face interview, counseling, chart review and critical decision making was performed Future Appointments  Date Time Provider Department Center  10/09/2022 10:30 AM Lucky Cowboy, MD [facility] None  04/02/2023 11:00 AM Lucky Cowboy, MD [facility] None  07/06/2023 11:00 AM Raynelle Dick, NP [facility] None     Plan:   During the course of the visit the patient was educated and counseled about appropriate screening and preventive services including:   Pneumococcal vaccine  Prevnar 13 Influenza vaccine Td vaccine Screening electrocardiogram Bone densitometry screening Colorectal cancer screening Diabetes screening Glaucoma screening Nutrition counseling  Advanced directives: requested   Subjective:  [name] is a 77 y.o. female who presents for Medicare Annual Wellness Visit and 3 month follow up for HTN, HLD, hypothyroidism, insomnia, weight and Vitamin D Deficiency.   Right leg has been having pain that been occurring in her hip and radiating down her leg.  Worse at night when lays down to sleep- gabapentin does not help. Has also noticed a dull pain that radiates from right shoulder to hand, occ pins and needles.  Lasts about 15 minutes when it occurs has been occurring for approximately 1 week.   She has  been using Gabapentin for pins/needles sensation in her feet which occurs only at bedtime.    She does get up 1-2 times at night to urinate and falls back asleep without difficulty.   Smoked from  age 39 to 2010 5 cig a day. 11.5 pack years.  Heartburn has resolved and no longer using Famotidine.  BMI is Body mass index is 32.54 kg/m., she has been working on diet and exercise. She is limiting simple carbs, uses whole wheat. She does limit red meat, dairy, eggs and fried foods.   Wt  Readings from Last 3 Encounters:  07/03/22 172 lb 3.2 oz (78.1 kg)  03/27/22 169 lb 3.2 oz (76.7 kg)  01/09/22 174 lb 6.4 oz (79.1 kg)  She lives at [facility] , an independent living facility.    Her blood pressure has been controlled. Furosemide 40 mg BID. Today her BP: 114/68  BP Readings from Last 3 Encounters:  07/03/22 114/68  03/27/22 130/82  01/09/22 128/78  She does workout. She denies chest pain, shortness of breath, dizziness.   She is on Zetia 10 mg every day and fenofibrate 145 mg every day  . Had myalgias with Atorvastatin and Rosuvastatin. Her cholesterol is not at goal. The cholesterol last visit was:   Lab Results  Component Value Date   CHOL 249 (H) 03/27/2022   HDL 44 (L) 03/27/2022   LDLCALC 162 (H) 03/27/2022   TRIG 264 (H) 03/27/2022   CHOLHDL 5.7 (H) 03/27/2022    She has been working on diet and exercise, we monitor for prediabetes, and denies increased appetite, nausea, paresthesia of the feet, polydipsia, polyuria, visual disturbances, vomiting and weight loss. Last A1C in the office was:  Lab Results  Component Value Date   HGBA1C 5.5 03/27/2022   Last GFR: Lab Results  Component Value Date   EGFR 90 03/27/2022    Patient is on Vitamin D supplement.   Lab Results  Component Value Date   VD25OH 94 03/27/2022      Medication Review: Current Outpatient Medications on File Prior to Visit  Medication Sig Dispense Refill   Acetaminophen (TYLENOL EXTRA STRENGTH PO) Take by mouth.     aspirin 81 MG tablet Take 81 mg by mouth daily.     Calcium Carbonate (CALCIUM 500 PO) Take 1 tablet by mouth daily.     Cholecalciferol (VITAMIN D PO) Take 2,000 Int'l Units by mouth 2 (two) times daily.      ezetimibe (ZETIA) 10 MG tablet Take       1 tablet      Daily       for Cholesterol 90 tablet 3   fenofibrate (TRICOR) 145 MG tablet TAKE 1 TABLET DAILY FOR TRIGLYCERIDES (BLOOD FATS ) 90 tablet 3   furosemide (LASIX) 40 MG tablet TAKE 1 TABLET TWICE A DAY FOR BP &  FLUID 180 tablet 1   gabapentin (NEURONTIN) 300 MG capsule TAKE 1 CAPSULE BY MOUTH EVERYDAY AT BEDTIME 90 capsule 2   GINKGO BILOBA PLUS PO Take 60 mg by mouth daily.     levothyroxine (SYNTHROID) 88 MCG tablet Take 88 mcg by mouth daily. 1/2 tab M W F and 1 tab the rest of the days     Magnesium 500 MG TABS Take by mouth.     Multiple Vitamins-Minerals (MULTIVITAMIN WITH MINERALS) tablet Take 1 tablet by mouth daily.     TURMERIC PO Take 1,500 mg by mouth daily.     vitamin C (ASCORBIC ACID) 500 MG tablet Take 1,000 mg by mouth daily. Takes 2,000mg      famotidine (PEPCID) 20 MG tablet Take 1 tablet (20 mg total) by mouth daily. 90 tablet 1   rosuvastatin (CRESTOR) 10 MG tablet  Take  1 tablet  Daily  for Cholesterol (Patient not taking: Reported on 07/03/2022) 90 tablet 1   No current facility-administered medications on file prior to visit.    Allergies  Allergen Reactions   Aspirin     REACTION: GI ulcers   Fish Oil     Allergic to fish oil and shell fish   Morphine And Codeine    Sulfa Antibiotics    Vicodin [Hydrocodone-Acetaminophen] Nausea Only    Current Problems (verified) Patient Active Problem List   Diagnosis Date Noted   Insomnia 05/29/2018   Hypothyroid 04/13/2017   Benign paroxysmal positional vertigo 02/07/2016   BMI 29.0-29.9,adult 09/21/2014   Encounter for Medicare annual wellness exam 09/21/2014   Medication management 08/15/2013   Abnormal glucose 01/26/2013   Vitamin D deficiency 12/13/2012   GERD    Hyperlipidemia, mixed    Depression, major, in remission (HCC)    Essential hypertension 09/09/2008    Screening Tests Immunization History  Administered Date(s) Administered   Fluad Quad(high Dose 65+) 10/23/2019   Influenza, High Dose Seasonal PF 10/18/2014, 11/06/2015, 10/22/2017, 10/03/2021   Influenza-Unspecified 10/13/2016, 10/26/2019   PFIZER(Purple Top)SARS-COV-2 Vaccination 05/14/2019, 11/13/2019, 11/13/2019, 07/14/2020, 07/14/2020   Pfizer  Covid-19 Vaccine Bivalent Booster 33yrs & up 03/20/2021   Pneumococcal Conjugate-13 06/21/2014   Pneumococcal Polysaccharide-23 01/21/2012   Td 01/21/2012   Health Maintenance  Topic Date Due   COVID-19 Vaccine (5 - 2023-24 season) 07/19/2022 (Originally 09/06/2021)   Zoster Vaccines- Shingrix (1 of 2) 10/03/2022 (Originally 12/02/1964)   INFLUENZA VACCINE  08/07/2022   Medicare Annual Wellness (AWV)  07/03/2023   Pneumonia Vaccine 9+ Years old  Completed   DEXA SCAN  Completed   Hepatitis C Screening  Completed   HPV VACCINES  Aged Out   DTaP/Tdap/Td  Discontinued   Colonoscopy  Discontinued       Names of Other Physician/Practitioners you currently use: 1. [facility] here for primary care 2. Eye Exam 2022 Dr. Lorin Picket in [facility] 3. Dental Exam , [facility] 2022  Patient Care Team: Lucky Cowboy, MD as PCP - General (Internal Medicine) Hart Carwin, MD (Inactive) as Consulting Physician (Gastroenterology)  SURGICAL HISTORY She  has a past surgical history that includes Back surgery; Appendectomy; Cesarean section; Abdominal hysterectomy (1981); and Salpingoophorectomy (1981 left). FAMILY HISTORY Her family history includes Diabetes in her mother; Heart disease in her father, mother, and sister; Hypertension in her father; Kidney disease in her mother. SOCIAL HISTORY She  reports that she quit smoking about 14 years ago. Her smoking use included cigarettes. She started smoking about 60 years ago. She has a 11.50 pack-year smoking history. She has never used smokeless tobacco. She reports that she does not drink alcohol and does not use drugs.   MEDICARE WELLNESS OBJECTIVES: Physical activity:   Cardiac risk factors: Cardiac Risk Factors include: advanced age (>73men, >50 women);dyslipidemia;family history of premature cardiovascular disease;sedentary lifestyle;obesity (BMI >30kg/m2);smoking/ tobacco exposure Depression/mood  screen:      07/03/2022   11:28 AM  Depression screen PHQ 2/9  Decreased Interest 0  Down, Depressed, Hopeless 0  PHQ - 2 Score 0    ADLs:     07/03/2022   11:21 AM 03/26/2022   11:00 PM  In your present state of health, do you have any difficulty performing the following activities:  Hearing? 0 0  Vision? 0 0  Difficulty concentrating or making decisions? 0 0  Walking or climbing stairs? 1 0  Dressing  or bathing? 0 0  Doing errands, shopping? 0 0      Cognitive Testing  Alert? Yes  Normal Appearance?Yes  Oriented to person? Yes  Place? Yes   Time? Yes  Recall of three objects?  Yes  Can perform simple calculations? Yes  Displays appropriate judgment?Yes  Can read the correct time from a watch face?Yes  EOL planning:    Review of Systems  Constitutional:  Negative for chills, fever and weight loss.  HENT:  Negative for congestion and hearing loss.   Eyes:  Negative for blurred vision and double vision.  Respiratory:  Negative for cough, shortness of breath and wheezing.   Cardiovascular:  Negative for chest pain, palpitations, orthopnea and leg swelling.  Gastrointestinal:  Negative for abdominal pain, constipation, diarrhea, heartburn, nausea and vomiting.       Occasional difficulty swallowing food and pills  Genitourinary:  Negative for dysuria.  Musculoskeletal:  Positive for joint pain (right hip and left shoulder). Negative for falls and myalgias.  Skin:  Negative for rash.  Neurological:  Negative for dizziness, tingling, tremors and loss of consciousness.  Psychiatric/Behavioral:  Negative for depression, memory loss and suicidal ideas.      Objective:     Today's Vitals   07/03/22 1049  BP: 114/68  Pulse: 70  Temp: (!) 97.3 F (36.3 C)  SpO2: 99%  Weight: 172 lb 3.2 oz (78.1 kg)  Height: 5\' 1"  (1.549 m)   Physical Exam:  BP 114/68   Pulse 70   Temp (!) 97.3 F (36.3 C)   Ht 5\' 1"  (1.549 m)   Wt 172 lb 3.2 oz (78.1 kg)   SpO2 99%   BMI  32.54 kg/m   General Appearance: Well nourished, in no apparent distress. Eyes: PERRLA, EOMs, conjunctiva no swelling or erythema Sinuses: No Frontal/maxillary tenderness ENT/Mouth: Ext aud canals clear, TMs without erythema, bulging. No erythema, swelling, or exudate on post pharynx.  Tonsils not swollen or erythematous. Hearing normal.  Neck: Supple, thyroid normal.  Respiratory: Respiratory effort normal, BS equal bilaterally without rales, rhonchi, wheezing or stridor.  Cardio: RRR with no MRGs. Brisk peripheral pulses with scant non-pitting edema   Abdomen: Soft, obese/mildly distended, + BS.  Non tender, no guarding, rebound, hernias, masses. Lymphatics: Non tender without lymphadenopathy.  Musculoskeletal: Full ROM, 5/5 strength, of all extremities except right leg 4/5. Pressure over right greater trochanter does not reproduce pain. Left shoulder mildly tender posterior. Skin: Warm, dry without rashes, lesions; he has fragile skin and numerous small ecchymoses to bilateral upper extremities Neuro: Cranial nerves intact. Normal muscle tone, no cerebellar symptoms. Sensation intact.  Psych: Awake and oriented X 3, normal affect, Insight and Judgment appropriate.    Medicare Attestation I have personally reviewed: The patient's medical and social history Their use of alcohol, tobacco or illicit drugs Their current medications and supplements The patient's functional ability including ADLs,fall risks, home safety risks, cognitive, and hearing and visual impairment Diet and physical activities Evidence for depression or mood disorders  The patient's weight, height, BMI, and visual acuity have been recorded in the chart.  I have made referrals, counseling, and provided education to the patient based on review of the above and I have provided the patient with a written personalized care plan for preventive services.      Revonda Humphrey ANP-C  [facility]  07/03/2022

## 2022-07-03 ENCOUNTER — Ambulatory Visit (INDEPENDENT_AMBULATORY_CARE_PROVIDER_SITE_OTHER): Payer: PPO | Admitting: Nurse Practitioner

## 2022-07-03 ENCOUNTER — Encounter: Payer: Self-pay | Admitting: Nurse Practitioner

## 2022-07-03 VITALS — BP 114/68 | HR 70 | Temp 97.3°F | Ht 61.0 in | Wt 172.2 lb

## 2022-07-03 DIAGNOSIS — T466X5A Adverse effect of antihyperlipidemic and antiarteriosclerotic drugs, initial encounter: Secondary | ICD-10-CM

## 2022-07-03 DIAGNOSIS — Z0001 Encounter for general adult medical examination with abnormal findings: Secondary | ICD-10-CM | POA: Diagnosis not present

## 2022-07-03 DIAGNOSIS — R6889 Other general symptoms and signs: Secondary | ICD-10-CM | POA: Diagnosis not present

## 2022-07-03 DIAGNOSIS — Z79899 Other long term (current) drug therapy: Secondary | ICD-10-CM | POA: Diagnosis not present

## 2022-07-03 DIAGNOSIS — M25551 Pain in right hip: Secondary | ICD-10-CM | POA: Diagnosis not present

## 2022-07-03 DIAGNOSIS — I10 Essential (primary) hypertension: Secondary | ICD-10-CM | POA: Diagnosis not present

## 2022-07-03 DIAGNOSIS — E6609 Other obesity due to excess calories: Secondary | ICD-10-CM

## 2022-07-03 DIAGNOSIS — G4700 Insomnia, unspecified: Secondary | ICD-10-CM | POA: Diagnosis not present

## 2022-07-03 DIAGNOSIS — F325 Major depressive disorder, single episode, in full remission: Secondary | ICD-10-CM | POA: Diagnosis not present

## 2022-07-03 DIAGNOSIS — R7309 Other abnormal glucose: Secondary | ICD-10-CM

## 2022-07-03 DIAGNOSIS — K2100 Gastro-esophageal reflux disease with esophagitis, without bleeding: Secondary | ICD-10-CM | POA: Diagnosis not present

## 2022-07-03 DIAGNOSIS — Z0000 Encounter for general adult medical examination without abnormal findings: Secondary | ICD-10-CM

## 2022-07-03 DIAGNOSIS — E782 Mixed hyperlipidemia: Secondary | ICD-10-CM | POA: Diagnosis not present

## 2022-07-03 DIAGNOSIS — E039 Hypothyroidism, unspecified: Secondary | ICD-10-CM | POA: Diagnosis not present

## 2022-07-03 DIAGNOSIS — G629 Polyneuropathy, unspecified: Secondary | ICD-10-CM | POA: Diagnosis not present

## 2022-07-03 DIAGNOSIS — G720 Drug-induced myopathy: Secondary | ICD-10-CM

## 2022-07-03 DIAGNOSIS — E559 Vitamin D deficiency, unspecified: Secondary | ICD-10-CM | POA: Diagnosis not present

## 2022-07-03 MED ORDER — DEXAMETHASONE 4 MG PO TABS
ORAL_TABLET | ORAL | 0 refills | Status: DC
Start: 2022-07-03 — End: 2022-08-26

## 2022-07-03 NOTE — Patient Instructions (Signed)
Hip Pain The hip is the joint between the upper legs and the lower pelvis. The bones, cartilage, tendons, and muscles of your hip joint support your body and allow you to move around. Hip pain can range from a minor ache to severe pain in one or both of your hips. The pain may be felt on the inside of the hip joint near the groin, or on the outside near the buttocks and upper thigh. You may also have swelling or stiffness in your hip area. Follow these instructions at home: Managing pain, stiffness, and swelling     If told, put ice on the painful area. Put ice in a plastic bag. Place a towel between your skin and the bag. Leave the ice on for 20 minutes, 2-3 times a day. If told, apply heat to the affected area as often as told by your health care provider. Use the heat source that your provider recommends, such as a moist heat pack or a heating pad. Place a towel between your skin and the heat source. Leave the heat on for 20-30 minutes. If your skin turns bright red, remove the ice or heat right away to prevent skin damage. The risk of damage is higher if you cannot feel pain, heat, or cold. Activity Do exercises as told by your provider. Avoid activities that cause pain. General instructions  Take over-the-counter and prescription medicines only as told by your provider. Keep a journal of your symptoms. Write down: How often you have hip pain. The location of your pain. What the pain feels like. What makes the pain worse. Sleep with a pillow between your legs on your most comfortable side. Keep all follow-up visits. Your provider will monitor your pain and activity. Contact a health care provider if: You cannot put weight on your leg. Your pain or swelling gets worse after a week. It gets harder to walk. You have a fever. Get help right away if: You fall. You have a sudden increase in pain and swelling in your hip. Your hip is red or swollen or very tender to touch. This  information is not intended to replace advice given to you by your health care provider. Make sure you discuss any questions you have with your health care provider. Document Revised: 08/27/2021 Document Reviewed: 08/27/2021 Elsevier Patient Education  2024 Elsevier Inc.  

## 2022-07-04 LAB — CBC WITH DIFFERENTIAL/PLATELET
Absolute Monocytes: 517 cells/uL (ref 200–950)
Basophils Absolute: 38 cells/uL (ref 0–200)
Basophils Relative: 0.5 %
Eosinophils Absolute: 106 cells/uL (ref 15–500)
Eosinophils Relative: 1.4 %
HCT: 40.8 % (ref 35.0–45.0)
Hemoglobin: 13.4 g/dL (ref 11.7–15.5)
Lymphs Abs: 1695 cells/uL (ref 850–3900)
MCH: 29.6 pg (ref 27.0–33.0)
MCHC: 32.8 g/dL (ref 32.0–36.0)
MCV: 90.3 fL (ref 80.0–100.0)
MPV: 10.7 fL (ref 7.5–12.5)
Monocytes Relative: 6.8 %
Neutro Abs: 5244 cells/uL (ref 1500–7800)
Neutrophils Relative %: 69 %
Platelets: 354 10*3/uL (ref 140–400)
RBC: 4.52 10*6/uL (ref 3.80–5.10)
RDW: 12.8 % (ref 11.0–15.0)
Total Lymphocyte: 22.3 %
WBC: 7.6 10*3/uL (ref 3.8–10.8)

## 2022-07-04 LAB — COMPLETE METABOLIC PANEL WITH GFR
AG Ratio: 2 (calc) (ref 1.0–2.5)
ALT: 20 U/L (ref 6–29)
AST: 24 U/L (ref 10–35)
Albumin: 5 g/dL (ref 3.6–5.1)
Alkaline phosphatase (APISO): 50 U/L (ref 37–153)
BUN: 14 mg/dL (ref 7–25)
CO2: 31 mmol/L (ref 20–32)
Calcium: 10.5 mg/dL — ABNORMAL HIGH (ref 8.6–10.4)
Chloride: 105 mmol/L (ref 98–110)
Creat: 0.88 mg/dL (ref 0.60–1.00)
Globulin: 2.5 g/dL (calc) (ref 1.9–3.7)
Glucose, Bld: 93 mg/dL (ref 65–99)
Potassium: 5 mmol/L (ref 3.5–5.3)
Sodium: 146 mmol/L (ref 135–146)
Total Bilirubin: 0.5 mg/dL (ref 0.2–1.2)
Total Protein: 7.5 g/dL (ref 6.1–8.1)
eGFR: 68 mL/min/{1.73_m2} (ref >=60)

## 2022-07-04 LAB — LIPID PANEL
Cholesterol: 185 mg/dL (ref <200)
HDL: 49 mg/dL — ABNORMAL LOW (ref >=50)
LDL Cholesterol (Calc): 106 mg/dL (calc) — ABNORMAL HIGH
Non-HDL Cholesterol (Calc): 136 mg/dL (calc) — ABNORMAL HIGH (ref <130)
Total CHOL/HDL Ratio: 3.8 (calc) (ref <5.0)
Triglycerides: 187 mg/dL — ABNORMAL HIGH (ref <150)

## 2022-07-04 LAB — TSH: TSH: 2.53 mIU/L (ref 0.40–4.50)

## 2022-08-26 ENCOUNTER — Encounter: Payer: Self-pay | Admitting: Nurse Practitioner

## 2022-08-26 ENCOUNTER — Other Ambulatory Visit: Payer: Self-pay

## 2022-08-26 ENCOUNTER — Ambulatory Visit: Payer: PPO | Admitting: Nurse Practitioner

## 2022-08-26 VITALS — HR 74 | Temp 97.7°F | Ht 61.0 in

## 2022-08-26 DIAGNOSIS — R6889 Other general symptoms and signs: Secondary | ICD-10-CM

## 2022-08-26 DIAGNOSIS — U071 COVID-19: Secondary | ICD-10-CM | POA: Diagnosis not present

## 2022-08-26 DIAGNOSIS — Z1152 Encounter for screening for COVID-19: Secondary | ICD-10-CM

## 2022-08-26 LAB — POCT INFLUENZA A/B
Influenza A, POC: NEGATIVE
Influenza B, POC: NEGATIVE

## 2022-08-26 LAB — POC COVID19 BINAXNOW: SARS Coronavirus 2 Ag: POSITIVE — AB

## 2022-08-26 MED ORDER — DEXAMETHASONE 1 MG PO TABS
ORAL_TABLET | ORAL | 0 refills | Status: AC
Start: 2022-08-26 — End: ?

## 2022-08-26 MED ORDER — PROMETHAZINE-DM 6.25-15 MG/5ML PO SYRP
5.0000 mL | ORAL_SOLUTION | Freq: Four times a day (QID) | ORAL | 1 refills | Status: DC | PRN
Start: 2022-08-26 — End: 2022-09-17

## 2022-08-26 MED ORDER — AZITHROMYCIN 250 MG PO TABS
ORAL_TABLET | ORAL | 1 refills | Status: DC
Start: 2022-08-26 — End: 2022-09-17

## 2022-08-26 NOTE — Patient Instructions (Addendum)
COVID Immue support reviewed Vit C, Vit D and Zinc Take tylenol PRN temp 101+ Push hydration Regular ambulation or calf exercises exercises for clot prevention and 81 mg ASA unless contraindicated Sx supportive therapy suggested Follow up via mychart or telephone if needed Advised patient obtain O2 monitor; present to ED if persistently <90% or with severe dyspnea, CP, fever uncontrolled by tylenol, confusion, sudden decline       Should remain in isolation 5 days from testing positive and then wear a mask when around other people for the following 5 days  -     azithromycin (ZITHROMAX) 250 MG tablet; Take 2 tablets (500 mg) on  Day 1,  followed by 1 tablet (250 mg) once daily on Days 2 through 5. -     dexamethasone (DECADRON) 1 MG tablet; Take 3 tabs for 3 days, 2 tabs for 3 days 1 tab for 5 days. Take with food. -     promethazine-dextromethorphan (PROMETHAZINE-DM) 6.25-15 MG/5ML syrup; Take 5 mLs by mouth 4 (four) times daily as needed for cough.

## 2022-08-26 NOTE — Progress Notes (Signed)
THIS ENCOUNTER IS A VIRTUAL VISIT DUE TO COVID-19 - PATIENT WAS NOT SEEN IN THE OFFICE.  PATIENT HAS CONSENTED TO VIRTUAL VISIT / TELEMEDICINE VISIT   Virtual Visit via telephone Note  I connected with  [name] on 08/26/2022 by telephone.  I verified that I am speaking with the correct person using two identifiers.    I discussed the limitations of evaluation and management by telemedicine and the availability of in person appointments. The patient expressed understanding and agreed to proceed.  History of Present Illness:  Pulse 74   Temp 97.7 F (36.5 C)   Ht 5\' 1"  (1.549 m)   SpO2 96%   BMI 32.54 kg/m  77 y.o. patient contacted office reporting URI sx sinus pressure, congestion, productive cough of thick yellow mucus.. she tested positive by rapid covid in office parking lot today. OV was conducted by telephone to minimize exposure. This patient was vaccinated for covid 19, last 03/20/21   Sx began 3  days ago with sinus pressure, congestion, thick yellow mucus from nose and body aches. She does have a persistent dry cough  Treatments tried so far: Sudafed, tylenol sinus  Exposures: unknown   Medications  Current Outpatient Medications (Endocrine & Metabolic):    levothyroxine (SYNTHROID) 88 MCG tablet, Take 88 mcg by mouth daily. 1/2 tab M W F and 1 tab the rest of the days   dexamethasone (DECADRON) 4 MG tablet, Take 3 tabs for 1 day, 2 tabs for 1 day 1 tab for 3 days. Take with food. (Patient not taking: Reported on 08/26/2022)  Current Outpatient Medications (Cardiovascular):    ezetimibe (ZETIA) 10 MG tablet, Take       1 tablet      Daily       for Cholesterol   fenofibrate (TRICOR) 145 MG tablet, TAKE 1 TABLET DAILY FOR TRIGLYCERIDES (BLOOD FATS )   furosemide (LASIX) 40 MG tablet, TAKE 1 TABLET TWICE A DAY FOR BP & FLUID   Current Outpatient Medications (Analgesics):    Acetaminophen (TYLENOL EXTRA STRENGTH PO), Take by mouth.   aspirin 81 MG tablet, Take  81 mg by mouth daily.   Current Outpatient Medications (Other):    Calcium Carbonate (CALCIUM 500 PO), Take 1 tablet by mouth daily.   Cholecalciferol (VITAMIN D PO), Take 2,000 Int'l Units by mouth 2 (two) times daily.    gabapentin (NEURONTIN) 300 MG capsule, TAKE 1 CAPSULE BY MOUTH EVERYDAY AT BEDTIME   GINKGO BILOBA PLUS PO, Take 60 mg by mouth daily.   Magnesium 500 MG TABS, Take by mouth.   Multiple Vitamins-Minerals (MULTIVITAMIN WITH MINERALS) tablet, Take 1 tablet by mouth daily.   TURMERIC PO, Take 1,500 mg by mouth daily.   vitamin C (ASCORBIC ACID) 500 MG tablet, Take 1,000 mg by mouth daily. Takes 2,000mg    famotidine (PEPCID) 20 MG tablet, Take 1 tablet (20 mg total) by mouth daily.  Allergies:  Allergies  Allergen Reactions   Aspirin     REACTION: GI ulcers   Fish Oil     Allergic to fish oil and shell fish   Morphine And Codeine    Sulfa Antibiotics    Vicodin [Hydrocodone-Acetaminophen] Nausea Only    Problem list She has Essential hypertension; GERD; Hyperlipidemia, mixed; Depression, major, in remission (HCC); Vitamin D deficiency; Abnormal glucose; Medication management; BMI 29.0-29.9,adult; Encounter for Medicare annual wellness exam; Benign paroxysmal positional vertigo; Hypothyroid; and Insomnia on their problem list.   Social History:   reports  that she quit smoking about 14 years ago. Her smoking use included cigarettes. She started smoking about 60 years ago. She has a 11.5 pack-year smoking history. She has never used smokeless tobacco. She reports that she does not drink alcohol and does not use drugs.  Observations/Objective:  General : Well sounding patient in no apparent distress HEENT: no hoarseness, no cough for duration of visit Lungs: speaks in complete sentences, no audible wheezing, no apparent distress Neurological: alert, oriented x 3 Psychiatric: pleasant, judgement appropriate   Assessment and Plan:  Covid 19 Covid 19 positive per  rapid screening test in office parking lot Risk factors include: hypertension, hyperlipidemia Symptoms are: mild Immue support reviewed Vit C, Vit D and Zinc Take tylenol PRN temp 101+ Push hydration Regular ambulation or calf exercises exercises for clot prevention and 81 mg ASA unless contraindicated Sx supportive therapy suggested Follow up via mychart or telephone if needed Advised patient obtain O2 monitor; present to ED if persistently <90% or with severe dyspnea, CP, fever uncontrolled by tylenol, confusion, sudden decline       Should remain in isolation 5 days from testing positive and then wear a mask when around other people for the following 5 days   Flu-like symptoms -     POCT Influenza A/B- negative  Encounter for screening for COVID-19 -     POC COVID-19- positive  COVID -     azithromycin (ZITHROMAX) 250 MG tablet; Take 2 tablets (500 mg) on  Day 1,  followed by 1 tablet (250 mg) once daily on Days 2 through 5. -     dexamethasone (DECADRON) 1 MG tablet; Take 3 tabs for 3 days, 2 tabs for 3 days 1 tab for 5 days. Take with food. -     promethazine-dextromethorphan (PROMETHAZINE-DM) 6.25-15 MG/5ML syrup; Take 5 mLs by mouth 4 (four) times daily as needed for cough.    Follow Up Instructions:  I discussed the assessment and treatment plan with the patient. The patient was provided an opportunity to ask questions and all were answered. The patient agreed with the plan and demonstrated an understanding of the instructions.   The patient was advised to call back or seek an in-person evaluation if the symptoms worsen or if the condition fails to improve as anticipated.  I provided 15 minutes of non-face-to-face time during this encounter.   Raynelle Dick, NP

## 2022-09-17 ENCOUNTER — Other Ambulatory Visit: Payer: Self-pay | Admitting: Internal Medicine

## 2022-09-17 MED ORDER — LEVOTHYROXINE SODIUM 88 MCG PO TABS: ORAL_TABLET | ORAL | 3 refills | Status: AC

## 2022-10-06 NOTE — Progress Notes (Unsigned)
Future Appointments  Date Time Provider Department  10/09/2022                        6 mo ov 10:30 AM Lucky Cowboy, MD [facility]  04/02/2023                        cpe 11:00 AM Lucky Cowboy, MD [facility]  07/06/2023                        wellness 11:00 AM Raynelle Dick, NP [facility]    History of Present Illness:      This very nice 77 y.o. WWF  presents for 6 month follow up with HTN, HLD, Pre-Diabetes and Vitamin D Deficiency.         Patient is treated for HTN  (1995)   & BP has been controlled at home. Today's BP is at goal -  132/78. Patient has had no complaints of any cardiac type chest pain, palpitations, dyspnea Pollyann Kennedy /PND, dizziness, claudication or dependent edema.        Hyperlipidemia is  not controlled with diet & Ezetimibe Hezzie Bump. Patient denies myalgias or other med SE's. Last Lipids were not at goal :  Lab Results  Component Value Date   CHOL 185 07/03/2022   HDL 49 (L) 07/03/2022   LDLCALC 106 (H) 07/03/2022   TRIG 187 (H) 07/03/2022   CHOLHDL 3.8 07/03/2022     Also, the patient has history of PreDiabetes (A1c 5.7% /2011) and has had no symptoms of reactive hypoglycemia, diabetic polys, paresthesias or visual blurring.  Last A1c was   Lab Results  Component Value Date   HGBA1C 5.5 03/27/2022                                                      Patient was dx'd Hypothyroid  in 2009  & has been on thyroid replacement since.                                                        Further, the patient also has history of Vitamin D Deficiency ("40"/2008) and supplements vitamin D . Last vitamin D was at goal :  Lab Results  Component Value Date   VD25OH 94 03/27/2022     Current Outpatient Medications on File Prior to Visit  Medication Sig   Acetaminophen (TYLENOL ES) Take by mouth.   aspirin 81 MG tablet Take  daily.   Calcium  500 mg Take 1 tablet  daily.   VITAMIN D  Take 2,000 Units  2 times daily.    ezetimibe   10 MG tablet Take 1 tablet   Daily       famotidine  20 MG tablet Take 1 tablet daily.   fenofibrate 145 MG tablet TAKE 1 TABLET DAILY    furosemide 40 MG tablet TAKE 1 TABLET TWICE A DAY    gabapentin  300 MG capsule TAKE 1 CAPSULE   AT BEDTIME   GINKGO BILOBA PLUS  Take 60 mg daily.   levothyroxine  88 MCG tablet 1/2 tab 3 x / week  MWF &1 tablet 4 x / week TThSS    Magnesium 500 MG TABS Take daily   Multiple Vitamins-Minerals  Take 1 tablet daily.   TURMERIC Take 1,500 mg daily.   vitamin C  500 MG tablet Take 2,000 mg  daily      Allergies  Allergen Reactions   Aspirin     REACTION: GI ulcers   Fish Oil     Allergic to fish oil and shell fish   Morphine And Codeine    Sulfa Antibiotics    Vicodin [Hydrocodone-Acetaminophen] Nausea Only     PMHx:   Past Medical History:  Diagnosis Date   Vitamin D deficiency      Immunization History  Administered Date(s) Administered   Fluad Quad(high Dose) 10/23/2019   Influenza, High Dose  10/18/2014, 11/06/2015, 10/22/2017, 10/03/2021   Influenza 10/13/2016, 10/26/2019   PFIZER SARS-COV-2 Vacc 05/14/2019, 11/13/2019, 11/13/2019, 07/14/2020, 07/14/2020   Pfizer Covid-19 Vacc Bivalent  03/20/2021   Pneumococcal -13 06/21/2014   Pneumococcal -23 01/21/2012   Td 01/21/2012     Past Surgical History:  Procedure Laterality Date   ABDOMINAL HYSTERECTOMY  1981   partial   APPENDECTOMY     BACK SURGERY     CESAREAN SECTION     SALPINGOOPHORECTOMY  1981 left   1982 right     FHx:    Reviewed / unchanged   SHx:    Reviewed / unchanged    Systems Review:  Constitutional: Denies fever, chills, wt changes, headaches, insomnia, fatigue, night sweats, change in appetite. Eyes: Denies redness, blurred vision, diplopia, discharge, itchy, watery eyes.  ENT: Denies discharge, congestion, post nasal drip, epistaxis, sore throat, earache, hearing loss, dental pain, tinnitus, vertigo, sinus pain, snoring.  CV: Denies chest pain,  palpitations, irregular heartbeat, syncope, dyspnea, diaphoresis, orthopnea, PND, claudication or edema. Respiratory: denies cough, dyspnea, DOE, pleurisy, hoarseness, laryngitis, wheezing.  Gastrointestinal: Denies dysphagia, odynophagia, heartburn, reflux, water brash, abdominal pain or cramps, nausea, vomiting, bloating, diarrhea, constipation, hematemesis, melena, hematochezia  or hemorrhoids. Genitourinary: Denies dysuria, frequency, urgency, nocturia, hesitancy, discharge, hematuria or flank pain. Musculoskeletal: Denies arthralgias, myalgias, stiffness, jt. swelling, pain, limping or strain/sprain.  Skin: Denies pruritus, rash, hives, warts, acne, eczema or change in skin lesion(s). Neuro: No weakness, tremor, incoordination, spasms, paresthesia or pain. Psychiatric: Denies confusion, memory loss or sensory loss. Endo: Denies change in weight, skin or hair change.  Heme/Lymph: No excessive bleeding, bruising or enlarged lymph nodes.   Physical Exam  BP 132/78   Pulse 69   Temp 97.9 F (36.6 C)   Resp 17   Ht 5\' 1"  (1.549 m)   Wt 169 lb 9.6 oz (76.9 kg)   SpO2 97%   BMI 32.05 kg/m   Appears  over nourished  and in no distress.  Eyes: PERRLA, EOMs, conjunctiva no swelling or erythema. Sinuses: No frontal/maxillary tenderness ENT/Mouth: EAC's clear, TM's nl w/o erythema, bulging. Nares clear w/o erythema, swelling, exudates. Oropharynx clear without erythema or exudates. Oral hygiene is good. Tongue normal, non obstructing. Hearing intact.  Neck: Supple. Thyroid not palpable. Car 2+/2+ without bruits, nodes or JVD. Chest: Respirations nl with BS clear & equal w/o rales, rhonchi, wheezing or stridor.  Cor: Heart sounds normal w/ regular rate and rhythm without sig. murmurs, gallops, clicks or rubs. Peripheral pulses normal and equal  without edema.  Abdomen: Soft & bowel sounds normal. Non-tender w/o guarding, rebound, hernias, masses or organomegaly.  Lymphatics:  Unremarkable.   Musculoskeletal: Full ROM all peripheral extremities, joint stability, 5/5 strength and normal gait.  Skin: Warm, dry without exposed rashes, lesions or ecchymosis apparent.  Neuro: Cranial nerves intact, reflexes equal bilaterally. Sensory-motor testing grossly intact. Tendon reflexes grossly intact.  Pysch: Alert & oriented x 3.  Insight and judgement nl & appropriate. No ideations.   Assessment and Plan:   1. Essential hypertension  - Continue medication, monitor blood pressure at home.  - Continue DASH diet.  Reminder to go to the ER if any CP,  SOB, nausea, dizziness, severe HA, changes vision/speech.    - CBC with Differential/Platelet - COMPLETE METABOLIC PANEL WITH GFR - Magnesium - TSH   2. Hyperlipidemia, mixed  - Continue diet/meds, exercise,& lifestyle modifications.  - Continue monitor periodic cholesterol/liver & renal functions     - Lipid panel - TSH   3. Abnormal glucose  - Continue diet, exercise  - Lifestyle modifications.  - Monitor appropriate labs    - Hemoglobin A1c - Insulin, random   4. Vitamin D deficiency  - Continue supplementation.    - VITAMIN D 25 Hydroxy  5. Statin myopathy  - Lipid panel   6. Hypothyroidism  - TSH   7. Medication management  - CBC with Differential/Platelet - COMPLETE METABOLIC PANEL WITH GFR - Magnesium - Lipid panel - TSH - Hemoglobin A1c - Insulin, random - VITAMIN D 25 Hydroxy          Discussed  regular exercise, BP monitoring, weight control to achieve/maintain BMI less than 25 and discussed med and SE's. Recommended labs to assess /monitor clinical status .  I discussed the assessment and treatment plan with the patient. The patient was provided an opportunity to ask questions and all were answered. The patient agreed with the plan and demonstrated an understanding of the instructions.  I provided over 30 minutes of exam, counseling, chart review and  complex critical decision  making.        The patient was advised to call back or seek an in-person evaluation if the symptoms worsen or if the condition fails to improve as anticipated.   Marinus Maw, MD

## 2022-10-06 NOTE — Patient Instructions (Signed)

## 2022-10-08 ENCOUNTER — Encounter: Payer: Self-pay | Admitting: Internal Medicine

## 2022-10-09 ENCOUNTER — Encounter: Payer: Self-pay | Admitting: Internal Medicine

## 2022-10-09 ENCOUNTER — Ambulatory Visit (INDEPENDENT_AMBULATORY_CARE_PROVIDER_SITE_OTHER): Payer: PPO | Admitting: Internal Medicine

## 2022-10-09 VITALS — BP 132/78 | HR 69 | Temp 97.9°F | Resp 17 | Ht 61.0 in | Wt 169.6 lb

## 2022-10-09 DIAGNOSIS — E782 Mixed hyperlipidemia: Secondary | ICD-10-CM

## 2022-10-09 DIAGNOSIS — E039 Hypothyroidism, unspecified: Secondary | ICD-10-CM

## 2022-10-09 DIAGNOSIS — R7309 Other abnormal glucose: Secondary | ICD-10-CM

## 2022-10-09 DIAGNOSIS — T466X5A Adverse effect of antihyperlipidemic and antiarteriosclerotic drugs, initial encounter: Secondary | ICD-10-CM | POA: Diagnosis not present

## 2022-10-09 DIAGNOSIS — G720 Drug-induced myopathy: Secondary | ICD-10-CM | POA: Diagnosis not present

## 2022-10-09 DIAGNOSIS — E559 Vitamin D deficiency, unspecified: Secondary | ICD-10-CM | POA: Diagnosis not present

## 2022-10-09 DIAGNOSIS — I10 Essential (primary) hypertension: Secondary | ICD-10-CM | POA: Diagnosis not present

## 2022-10-09 DIAGNOSIS — Z23 Encounter for immunization: Secondary | ICD-10-CM | POA: Diagnosis not present

## 2022-10-09 DIAGNOSIS — Z79899 Other long term (current) drug therapy: Secondary | ICD-10-CM

## 2022-10-09 MED ORDER — DEXAMETHASONE 4 MG PO TABS: ORAL_TABLET | ORAL | 1 refills | Status: DC

## 2022-10-10 LAB — COMPLETE METABOLIC PANEL WITH GFR
AG Ratio: 1.9 (calc) (ref 1.0–2.5)
ALT: 18 U/L (ref 6–29)
AST: 24 U/L (ref 10–35)
Albumin: 4.9 g/dL (ref 3.6–5.1)
Alkaline phosphatase (APISO): 51 U/L (ref 37–153)
BUN: 15 mg/dL (ref 7–25)
CO2: 32 mmol/L (ref 20–32)
Calcium: 10.3 mg/dL (ref 8.6–10.4)
Chloride: 103 mmol/L (ref 98–110)
Creat: 0.72 mg/dL (ref 0.60–1.00)
Globulin: 2.6 g/dL (ref 1.9–3.7)
Glucose, Bld: 93 mg/dL (ref 65–99)
Potassium: 3.8 mmol/L (ref 3.5–5.3)
Sodium: 144 mmol/L (ref 135–146)
Total Bilirubin: 0.5 mg/dL (ref 0.2–1.2)
Total Protein: 7.5 g/dL (ref 6.1–8.1)
eGFR: 86 mL/min/{1.73_m2} (ref >=60)

## 2022-10-10 LAB — CBC WITH DIFFERENTIAL/PLATELET
Absolute Monocytes: 390 {cells}/uL (ref 200–950)
Basophils Absolute: 42 {cells}/uL (ref 0–200)
Basophils Relative: 0.9 %
Eosinophils Absolute: 108 {cells}/uL (ref 15–500)
Eosinophils Relative: 2.3 %
HCT: 42 % (ref 35.0–45.0)
Hemoglobin: 14 g/dL (ref 11.7–15.5)
Lymphs Abs: 1542 {cells}/uL (ref 850–3900)
MCH: 31.1 pg (ref 27.0–33.0)
MCHC: 33.3 g/dL (ref 32.0–36.0)
MCV: 93.3 fL (ref 80.0–100.0)
MPV: 11.1 fL (ref 7.5–12.5)
Monocytes Relative: 8.3 %
Neutro Abs: 2618 {cells}/uL (ref 1500–7800)
Neutrophils Relative %: 55.7 %
Platelets: 353 10*3/uL (ref 140–400)
RBC: 4.5 10*6/uL (ref 3.80–5.10)
RDW: 12.6 % (ref 11.0–15.0)
Total Lymphocyte: 32.8 %
WBC: 4.7 10*3/uL (ref 3.8–10.8)

## 2022-10-10 LAB — LIPID PANEL
Cholesterol: 179 mg/dL (ref <200)
HDL: 47 mg/dL — ABNORMAL LOW (ref >=50)
LDL Cholesterol (Calc): 100 mg/dL — ABNORMAL HIGH
Non-HDL Cholesterol (Calc): 132 mg/dL — ABNORMAL HIGH (ref <130)
Total CHOL/HDL Ratio: 3.8 (calc) (ref <5.0)
Triglycerides: 202 mg/dL — ABNORMAL HIGH (ref <150)

## 2022-10-10 LAB — INSULIN, RANDOM: Insulin: 11.1 u[IU]/mL

## 2022-10-10 LAB — HEMOGLOBIN A1C
Hgb A1c MFr Bld: 5.6 %{Hb} (ref <5.7)
Mean Plasma Glucose: 114 mg/dL
eAG (mmol/L): 6.3 mmol/L

## 2022-10-10 LAB — TSH: TSH: 4.87 m[IU]/L — ABNORMAL HIGH (ref 0.40–4.50)

## 2022-10-10 LAB — VITAMIN D 25 HYDROXY (VIT D DEFICIENCY, FRACTURES): Vit D, 25-Hydroxy: 93 ng/mL (ref 30–100)

## 2022-10-10 LAB — MAGNESIUM: Magnesium: 2.2 mg/dL (ref 1.5–2.5)

## 2022-10-11 NOTE — Progress Notes (Signed)
<>*<>*<>*<>*<>*<>*<>*<>*<>*<>*<>*<>*<>*<>*<>*<>*<>*<>*<>*<>*<>*<>*<>*<>*<> <>*<>*<>*<>*<>*<>*<>*<>*<>*<>*<>*<>*<>*<>*<>*<>*<>*<>*<>*<>*<>*<>*<>*<>*<>  -  Test results slightly outside the reference range are not unusual. If there is anything important, I will review this with you,  otherwise it is considered normal test values.  If you have further questions,  please do not hesitate to contact me at the office or via My Chart.   <>*<>*<>*<>*<>*<>*<>*<>*<>*<>*<>*<>*<>*<>*<>*<>*<>*<>*<>*<>*<>*<>*<>*<>*<> <>*<>*<>*<>*<>*<>*<>*<>*<>*<>*<>*<>*<>*<>*<>*<>*<>*<>*<>*<>*<>*<>*<>*<>*<>  -  Chol = 179 -  Excellent   - Very low risk for Heart Attack  / Stroke <>*<>*<>*<>*<>*<>*<>*<>*<>*<>*<>*<>*<>*<>*<>*<>*<>*<>*<>*<>*<>*<>*<>*<>*<>  -  Triglycerides ( = 202 ) or fats in blood are too high                 (   Ideal or  Goal is less than 150  !  )    - Recommend avoid fried & greasy foods,  sweets / candy,   - Avoid white rice  (brown or wild rice or Quinoa is OK),   - Avoid white potatoes  (sweet potatoes are OK)   - Avoid anything made from white flour  - bagels, doughnuts, rolls, buns, biscuits, white and   wheat breads, pizza crust and traditional  pasta made of white flour & egg white  - (vegetarian pasta or spinach or wheat pasta is OK).    - Multi-grain bread is OK - like multi-grain flat bread or  sandwich thins.   - Avoid alcohol in excess.   - Exercise is also important. <>*<>*<>*<>*<>*<>*<>*<>*<>*<>*<>*<>*<>*<>*<>*<>*<>*<>*<>*<>*<>*<>*<>*<>*<> <>*<>*<>*<>*<>*<>*<>*<>*<>*<>*<>*<>*<>*<>*<>*<>*<>*<>*<>*<>*<>*<>*<>*<>*<>  -  TSH is slightly out of range , but not enough to change dosage, But . . . . . .   - Absolutely be sure not taking any Biotin that                                  will cause false reading of the TSH / Thyroid measurements   <>*<>*<>*<>*<>*<>*<>*<>*<>*<>*<>*<>*<>*<>*<>*<>*<>*<>*<>*<>*<>*<>*<>*<>*<>  -  Vitamin D = 93 - Excellent - Please keep dosage  same  <>*<>*<>*<>*<>*<>*<>*<>*<>*<>*<>*<>*<>*<>*<>*<>*<>*<>*<>*<>*<>*<>*<>*<>*<>  -  All Else - CBC - Kidneys - Electrolytes - Liver - Magnesium & Thyroid    - all  Normal / OK <>*<>*<>*<>*<>*<>*<>*<>*<>*<>*<>*<>*<>*<>*<>*<>*<>*<>*<>*<>*<>*<>*<>*<>*<> <>*<>*<>*<>*<>*<>*<>*<>*<>*<>*<>*<>*<>*<>*<>*<>*<>*<>*<>*<>*<>*<>*<>*<>*<>

## 2022-12-14 ENCOUNTER — Other Ambulatory Visit: Payer: Self-pay | Admitting: Nurse Practitioner

## 2022-12-14 DIAGNOSIS — I10 Essential (primary) hypertension: Secondary | ICD-10-CM

## 2023-01-14 NOTE — Progress Notes (Signed)
 FOLLOW UP   Assessment:   Essential hypertension Has lasix  PRN Denies any edema Continue to monitor blood pressure CBC  Hyperlipidemia, mixed Statin Myopathy Continue zetia  and fenofibrate  Lipid  CMP  Hypothyroidism, unspecified type Taking levothyroxine  88 mcg daily except M,W,F takes 1/2 a pill- if remains above 4.5 will change dose to 88 mcg daily Reminder to take on an empty stomach 30-52mins before first meal of the day. No antacid medications for 4 hours. TSH  Vitamin D  deficiency Continue supplementation  Abnormal glucose Discussed dietary and exercise modifications  Insomnia, unspecified type Discussed melatonin nightly Decrease stimulation, screen time Exercise regularly  Depression, major in remission  No medicaitons Doing well at this time  Neuropathy Only occurring at nighttime- uses Gabapentin  300 mg at bedtime Is noticing if she gets up in the middle of the night she gets pins and needle sensation and has difficulty falling asleep- Continues Tylenol  use which does control  Obesity Long discussion about weight loss, diet, and exercise Recommended diet heavy in fruits and veggies and low in animal meats, cheeses, and dairy products, appropriate calorie intake Patient will work on increasing lean proteins and reducing saturated fats and simple carbs Encouraged to not skip meals and increase exercise Follow up at next visit   Medication management -     CBC with Differential/Platelet -     COMPLETE METABOLIC PANEL WITH GFR -     Lipid panel -     TSH    Over 30 minutes of non face to face interview, counseling, chart review and critical decision making was performed Future Appointments  Date Time Provider Department Center  05/28/2023  2:00 PM Tonita Fallow, MD [facility] None  08/31/2023 10:30 AM Jude Lonell BRAVO, NP [facility] None       Subjective:  [name] is a 78 y.o. female who presents for 3 month follow up for  HTN, HLD, hypothyroidism, insomnia, weight and Vitamin D  Deficiency.    She has  been using Gabapentin  300mg  1-2 tabs at bedtime for pins/needles sensation in her feet .  She does get up 1-2 times at night to urinate and falls back asleep without difficulty.   Smoked from age 66 to 36 5 cig a day. 11.5 pack years.  Heartburn controlled with the use of famotidine  but only needs to use PRN  BMI is Body mass index is 32.57 kg/m., she has been working on diet and exercise. She is limiting simple carbs, uses whole wheat. She does limit red meat, dairy, eggs and fried foods.   Wt Readings from Last 3 Encounters:  01/15/23 172 lb 6.4 oz (78.2 kg)  10/09/22 169 lb 9.6 oz (76.9 kg)  07/03/22 172 lb 3.2 oz (78.1 kg)  She lives at [facility] , an independent living facility.    Her blood pressure has been controlled. Furosemide  40 mg BID. She does have selling in left foot and intermittent swelling in her right foot. Today her BP: 126/60  BP Readings from Last 3 Encounters:  01/15/23 126/60  10/09/22 132/78  07/03/22 114/68  She does workout. She denies chest pain, shortness of breath, dizziness.   She is on Zetia  10 mg every day and fenofibrate  145 mg every day  . Had myalgias with Atorvastatin  and Rosuvastatin . Her cholesterol is not at goal. She continues to limit fried foods, dairy, eggs and red meat. The cholesterol last visit was:   Lab Results  Component Value Date   CHOL 179 10/09/2022  HDL 47 (L) 10/09/2022   LDLCALC 100 (H) 10/09/2022   TRIG 202 (H) 10/09/2022   CHOLHDL 3.8 10/09/2022    She has been working on diet and exercise, we monitor for prediabetes, and denies increased appetite, nausea, paresthesia of the feet, polydipsia, polyuria, visual disturbances, vomiting and weight loss. Last A1C in the office was:  Lab Results  Component Value Date   HGBA1C 5.6 10/09/2022   Last GFR: Lab Results  Component Value Date   EGFR 86 10/09/2022    Patient is on Vitamin D   supplement.   Lab Results  Component Value Date   VD25OH 93 10/09/2022     She takes Levothyroxine  88 mcg 1/2 tab M-W-F and 1 tab the other days.  Lab Results  Component Value Date   TSH 4.87 (H) 10/09/2022    Medication Review: Current Outpatient Medications on File Prior to Visit  Medication Sig Dispense Refill   Acetaminophen  (TYLENOL  EXTRA STRENGTH PO) Take by mouth. Takes 2 at night     aspirin  81 MG tablet Take 81 mg by mouth daily.     Calcium  Carbonate (CALCIUM  500 PO) Take 1 tablet by mouth daily.     Cholecalciferol (VITAMIN D  PO) Take 2,000 Int'l Units by mouth 2 (two) times daily.      ezetimibe  (ZETIA ) 10 MG tablet Take       1 tablet      Daily       for Cholesterol 90 tablet 3   fenofibrate  (TRICOR ) 145 MG tablet TAKE 1 TABLET DAILY FOR TRIGLYCERIDES (BLOOD FATS ) 90 tablet 3   furosemide  (LASIX ) 40 MG tablet TAKE 1 TABLET BY MOUTH TWICE A DAY FOR BLOOD PRESSURE AND FLUID 180 tablet 1   gabapentin  (NEURONTIN ) 300 MG capsule TAKE 1 CAPSULE BY MOUTH EVERYDAY AT BEDTIME 90 capsule 2   GINKGO BILOBA PLUS PO Take 60 mg by mouth daily.     levothyroxine  (SYNTHROID ) 88 MCG tablet 1/2 tab 3 x / week  MWF &1 tablet 4 x / week TTHSS & Take on an empty stomach with only water for 30 minutes & no Antacid meds, Calcium  or Magnesium for 4 hours & avoid Biotin 63 tablet 3   Magnesium 500 MG TABS Take by mouth.     Multiple Vitamins-Minerals (MULTIVITAMIN WITH MINERALS) tablet Take 1 tablet by mouth daily.     vitamin C (ASCORBIC ACID) 500 MG tablet Take 1,000 mg by mouth daily. Takes 2,000mg      dexamethasone  (DECADRON ) 4 MG tablet Take 1 tab 3 x day - 3 days, then 2 x day - 3 days, then 1 tab daily (Patient not taking: Reported on 01/15/2023) 20 tablet 1   famotidine  (PEPCID ) 20 MG tablet Take 1 tablet (20 mg total) by mouth daily. 90 tablet 1   TURMERIC PO Take 1,500 mg by mouth daily. (Patient not taking: Reported on 01/15/2023)     No current facility-administered medications on file  prior to visit.    Allergies  Allergen Reactions   Aspirin      REACTION: GI ulcers   Fish Oil     Allergic to fish oil and shell fish   Morphine And Codeine    Sulfa Antibiotics    Vicodin [Hydrocodone-Acetaminophen ] Nausea Only    Current Problems (verified) Patient Active Problem List   Diagnosis Date Noted   Insomnia 05/29/2018   Hypothyroid 04/13/2017   Benign paroxysmal positional vertigo 02/07/2016   BMI 29.0-29.9,adult 09/21/2014   Encounter for Medicare annual  wellness exam 09/21/2014   Medication management 08/15/2013   Abnormal glucose 01/26/2013   Vitamin D  deficiency 12/13/2012   GERD    Hyperlipidemia, mixed    Depression, major, in remission [facility])    Essential hypertension 09/09/2008     Patient Care Team: Tonita Fallow, MD as PCP - General (Internal Medicine) Obie Princella HERO, MD (Inactive) as Consulting Physician (Gastroenterology)  SURGICAL HISTORY She  has a past surgical history that includes Back surgery; Appendectomy; Cesarean section; Abdominal hysterectomy (1981); and Salpingoophorectomy (1981 left). FAMILY HISTORY Her family history includes Diabetes in her mother; Heart disease in her father, mother, and sister; Hypertension in her father; Kidney disease in her mother. SOCIAL HISTORY She  reports that she quit smoking about 15 years ago. Her smoking use included cigarettes. She started smoking about 61 years ago. She has a 11.5 pack-year smoking history. She has never used smokeless tobacco. She reports that she does not drink alcohol and does not use drugs.   Review of Systems  Constitutional:  Negative for chills, fever and weight loss.  HENT:  Negative for congestion and hearing loss.   Eyes:  Negative for blurred vision and double vision.  Respiratory:  Negative for cough, shortness of breath and wheezing.   Cardiovascular:  Negative for chest pain, palpitations, orthopnea and leg swelling.  Gastrointestinal:  Negative for abdominal  pain, constipation, diarrhea, heartburn, nausea and vomiting.       Occasional difficulty swallowing food and pills  Genitourinary:  Negative for dysuria.  Musculoskeletal:  Negative for falls, joint pain and myalgias.  Skin:  Negative for rash.  Neurological:  Negative for dizziness, tingling, tremors and loss of consciousness.  Psychiatric/Behavioral:  Negative for depression, memory loss and suicidal ideas.      Objective:     Today's Vitals   01/15/23 0946  BP: 126/60  Pulse: 72  Temp: 97.8 F (36.6 C)  SpO2: 96%  Weight: 172 lb 6.4 oz (78.2 kg)  Height: 5' 1 (1.549 m)    Physical Exam:  BP 126/60   Pulse 72   Temp 97.8 F (36.6 C)   Ht 5' 1 (1.549 m)   Wt 172 lb 6.4 oz (78.2 kg)   SpO2 96%   BMI 32.57 kg/m   General Appearance: Well nourished, in no apparent distress. Eyes: PERRLA, EOMs, conjunctiva no swelling or erythema Sinuses: No Frontal/maxillary tenderness ENT/Mouth: Ext aud canals clear, TMs without erythema, bulging. No erythema, swelling, or exudate on post pharynx.  Tonsils not swollen or erythematous. Hearing normal.  Neck: Supple, thyroid  normal.  Respiratory: Respiratory effort normal, BS equal bilaterally without rales, rhonchi, wheezing or stridor.  Cardio: RRR with no MRGs. Brisk peripheral pulses with scant non-pitting edema   Abdomen: Soft, obese/mildly distended, + BS.  Non tender, no guarding, rebound, hernias, masses. Lymphatics: Non tender without lymphadenopathy.  Musculoskeletal: Full ROM, 5/5 strength, of all extremities except right leg 4/5. Pressure over right greater trochanter does not reproduce pain. Left shoulder mildly tender posterior. Skin: Warm, dry without rashes, lesions, no cerebellar symptoms. Sensation intact.  Psych: Awake and oriented X 3, normal affect, Insight and Judgment appropriate.       Lonell MICAEL Rous ANP-C  [facility]  01/15/2023

## 2023-01-15 ENCOUNTER — Ambulatory Visit (INDEPENDENT_AMBULATORY_CARE_PROVIDER_SITE_OTHER): Payer: PPO | Admitting: Nurse Practitioner

## 2023-01-15 ENCOUNTER — Encounter: Payer: Self-pay | Admitting: Nurse Practitioner

## 2023-01-15 VITALS — BP 126/60 | HR 72 | Temp 97.8°F | Ht 61.0 in | Wt 172.4 lb

## 2023-01-15 DIAGNOSIS — F325 Major depressive disorder, single episode, in full remission: Secondary | ICD-10-CM

## 2023-01-15 DIAGNOSIS — R7309 Other abnormal glucose: Secondary | ICD-10-CM | POA: Diagnosis not present

## 2023-01-15 DIAGNOSIS — I10 Essential (primary) hypertension: Secondary | ICD-10-CM | POA: Diagnosis not present

## 2023-01-15 DIAGNOSIS — Z1211 Encounter for screening for malignant neoplasm of colon: Secondary | ICD-10-CM

## 2023-01-15 DIAGNOSIS — E039 Hypothyroidism, unspecified: Secondary | ICD-10-CM | POA: Diagnosis not present

## 2023-01-15 DIAGNOSIS — E782 Mixed hyperlipidemia: Secondary | ICD-10-CM

## 2023-01-15 DIAGNOSIS — Z79899 Other long term (current) drug therapy: Secondary | ICD-10-CM

## 2023-01-15 DIAGNOSIS — G720 Drug-induced myopathy: Secondary | ICD-10-CM

## 2023-01-15 DIAGNOSIS — E559 Vitamin D deficiency, unspecified: Secondary | ICD-10-CM | POA: Diagnosis not present

## 2023-01-15 NOTE — Patient Instructions (Signed)
Fair life protein shakes Eat more frequently - try not to go more than 6 hours without protein Aim for 90 grams of protein a day- 30 breakfast/30 lunch 30 dinner Try to keep net carbs less than 50 Net Carbs=Total Carbs-fiber- sugar alcohols Exercise heartrate 120-140(fat burning zone)- walking 20-30 minutes 4 days a week  

## 2023-01-16 LAB — LIPID PANEL
Cholesterol: 185 mg/dL (ref <200)
HDL: 42 mg/dL — ABNORMAL LOW (ref >=50)
LDL Cholesterol (Calc): 107 mg/dL — ABNORMAL HIGH
Non-HDL Cholesterol (Calc): 143 mg/dL — ABNORMAL HIGH (ref <130)
Total CHOL/HDL Ratio: 4.4 (calc) (ref <5.0)
Triglycerides: 237 mg/dL — ABNORMAL HIGH (ref <150)

## 2023-01-16 LAB — COMPLETE METABOLIC PANEL WITH GFR
AG Ratio: 1.9 (calc) (ref 1.0–2.5)
ALT: 18 U/L (ref 6–29)
AST: 25 U/L (ref 10–35)
Albumin: 5 g/dL (ref 3.6–5.1)
Alkaline phosphatase (APISO): 55 U/L (ref 37–153)
BUN: 21 mg/dL (ref 7–25)
CO2: 29 mmol/L (ref 20–32)
Calcium: 10.3 mg/dL (ref 8.6–10.4)
Chloride: 102 mmol/L (ref 98–110)
Creat: 0.93 mg/dL (ref 0.60–1.00)
Globulin: 2.6 g/dL (ref 1.9–3.7)
Glucose, Bld: 74 mg/dL (ref 65–99)
Potassium: 4.7 mmol/L (ref 3.5–5.3)
Sodium: 142 mmol/L (ref 135–146)
Total Bilirubin: 0.5 mg/dL (ref 0.2–1.2)
Total Protein: 7.6 g/dL (ref 6.1–8.1)
eGFR: 63 mL/min/{1.73_m2} (ref >=60)

## 2023-01-16 LAB — CBC WITH DIFFERENTIAL/PLATELET
Absolute Lymphocytes: 1493 {cells}/uL (ref 850–3900)
Absolute Monocytes: 394 {cells}/uL (ref 200–950)
Basophils Absolute: 72 {cells}/uL (ref 0–200)
Basophils Relative: 1.5 %
Eosinophils Absolute: 101 {cells}/uL (ref 15–500)
Eosinophils Relative: 2.1 %
HCT: 42.8 % (ref 35.0–45.0)
Hemoglobin: 14 g/dL (ref 11.7–15.5)
MCH: 30.1 pg (ref 27.0–33.0)
MCHC: 32.7 g/dL (ref 32.0–36.0)
MCV: 92 fL (ref 80.0–100.0)
MPV: 10.6 fL (ref 7.5–12.5)
Monocytes Relative: 8.2 %
Neutro Abs: 2741 {cells}/uL (ref 1500–7800)
Neutrophils Relative %: 57.1 %
Platelets: 389 10*3/uL (ref 140–400)
RBC: 4.65 10*6/uL (ref 3.80–5.10)
RDW: 12.3 % (ref 11.0–15.0)
Total Lymphocyte: 31.1 %
WBC: 4.8 10*3/uL (ref 3.8–10.8)

## 2023-01-16 LAB — TSH: TSH: 7.32 m[IU]/L — ABNORMAL HIGH (ref 0.40–4.50)

## 2023-02-17 ENCOUNTER — Other Ambulatory Visit: Payer: Self-pay

## 2023-02-17 DIAGNOSIS — E782 Mixed hyperlipidemia: Secondary | ICD-10-CM

## 2023-02-17 MED ORDER — FENOFIBRATE 145 MG PO TABS: ORAL_TABLET | ORAL | 0 refills | Status: AC

## 2023-03-03 ENCOUNTER — Other Ambulatory Visit: Payer: Self-pay

## 2023-03-03 MED ORDER — EZETIMIBE 10 MG PO TABS: ORAL_TABLET | ORAL | 0 refills | Status: AC

## 2023-03-03 MED ORDER — GABAPENTIN 300 MG PO CAPS: 300.0000 mg | ORAL_CAPSULE | Freq: Every day | ORAL | 0 refills | Status: AC

## 2023-04-02 ENCOUNTER — Encounter: Payer: PPO | Admitting: Internal Medicine

## 2023-04-14 ENCOUNTER — Other Ambulatory Visit: Payer: Self-pay | Admitting: Family Medicine

## 2023-04-14 DIAGNOSIS — E039 Hypothyroidism, unspecified: Secondary | ICD-10-CM | POA: Diagnosis not present

## 2023-04-14 DIAGNOSIS — E785 Hyperlipidemia, unspecified: Secondary | ICD-10-CM | POA: Diagnosis not present

## 2023-04-14 DIAGNOSIS — Z0000 Encounter for general adult medical examination without abnormal findings: Secondary | ICD-10-CM | POA: Diagnosis not present

## 2023-04-14 DIAGNOSIS — I872 Venous insufficiency (chronic) (peripheral): Secondary | ICD-10-CM | POA: Diagnosis not present

## 2023-04-14 DIAGNOSIS — Z13820 Encounter for screening for osteoporosis: Secondary | ICD-10-CM

## 2023-05-25 ENCOUNTER — Other Ambulatory Visit: Payer: Self-pay | Admitting: Family Medicine

## 2023-05-25 DIAGNOSIS — Z1231 Encounter for screening mammogram for malignant neoplasm of breast: Secondary | ICD-10-CM

## 2023-05-28 ENCOUNTER — Encounter: Payer: PPO | Admitting: Internal Medicine

## 2023-05-31 ENCOUNTER — Other Ambulatory Visit: Payer: Self-pay | Admitting: Family

## 2023-06-04 ENCOUNTER — Ambulatory Visit
Admission: RE | Admit: 2023-06-04 | Discharge: 2023-06-04 | Disposition: A | Discharge status: Home / Self Care | Source: Ambulatory Visit | Attending: Family Medicine | Admitting: Family Medicine

## 2023-06-04 DIAGNOSIS — Z1231 Encounter for screening mammogram for malignant neoplasm of breast: Secondary | ICD-10-CM

## 2023-06-10 ENCOUNTER — Other Ambulatory Visit: Payer: Self-pay

## 2023-06-10 ENCOUNTER — Encounter (HOSPITAL_COMMUNITY): Payer: Self-pay | Admitting: *Deleted

## 2023-06-10 ENCOUNTER — Emergency Department (HOSPITAL_COMMUNITY)

## 2023-06-10 ENCOUNTER — Observation Stay (HOSPITAL_COMMUNITY)
Admission: EM | Admit: 2023-06-10 | Discharge: 2023-06-11 | Disposition: A | Discharge status: Home / Self Care | Attending: Family Medicine | Admitting: Family Medicine

## 2023-06-10 DIAGNOSIS — R0789 Other chest pain: Secondary | ICD-10-CM | POA: Diagnosis not present

## 2023-06-10 DIAGNOSIS — R079 Chest pain, unspecified: Secondary | ICD-10-CM | POA: Diagnosis not present

## 2023-06-10 DIAGNOSIS — E038 Other specified hypothyroidism: Secondary | ICD-10-CM

## 2023-06-10 DIAGNOSIS — I10 Essential (primary) hypertension: Secondary | ICD-10-CM | POA: Diagnosis not present

## 2023-06-10 DIAGNOSIS — E7849 Other hyperlipidemia: Secondary | ICD-10-CM

## 2023-06-10 DIAGNOSIS — G609 Hereditary and idiopathic neuropathy, unspecified: Secondary | ICD-10-CM

## 2023-06-10 DIAGNOSIS — Z7982 Long term (current) use of aspirin: Secondary | ICD-10-CM | POA: Insufficient documentation

## 2023-06-10 DIAGNOSIS — G9009 Other idiopathic peripheral autonomic neuropathy: Secondary | ICD-10-CM | POA: Diagnosis not present

## 2023-06-10 DIAGNOSIS — Z8719 Personal history of other diseases of the digestive system: Secondary | ICD-10-CM | POA: Insufficient documentation

## 2023-06-10 DIAGNOSIS — I249 Acute ischemic heart disease, unspecified: Secondary | ICD-10-CM | POA: Insufficient documentation

## 2023-06-10 DIAGNOSIS — E039 Hypothyroidism, unspecified: Secondary | ICD-10-CM | POA: Diagnosis present

## 2023-06-10 DIAGNOSIS — E785 Hyperlipidemia, unspecified: Secondary | ICD-10-CM | POA: Diagnosis present

## 2023-06-10 DIAGNOSIS — Z8659 Personal history of other mental and behavioral disorders: Secondary | ICD-10-CM

## 2023-06-10 DIAGNOSIS — R399 Unspecified symptoms and signs involving the genitourinary system: Secondary | ICD-10-CM | POA: Diagnosis not present

## 2023-06-10 DIAGNOSIS — G629 Polyneuropathy, unspecified: Secondary | ICD-10-CM

## 2023-06-10 LAB — CBC
HCT: 42.6 % (ref 36.0–46.0)
Hemoglobin: 13.8 g/dL (ref 12.0–15.0)
MCH: 30 pg (ref 26.0–34.0)
MCHC: 32.4 g/dL (ref 30.0–36.0)
MCV: 92.6 fL (ref 80.0–100.0)
Platelets: 350 10*3/uL (ref 150–400)
RBC: 4.6 MIL/uL (ref 3.87–5.11)
RDW: 13.5 % (ref 11.5–15.5)
WBC: 8 10*3/uL (ref 4.0–10.5)
nRBC: 0 % (ref 0.0–0.2)

## 2023-06-10 LAB — BASIC METABOLIC PANEL WITH GFR
Anion gap: 11 (ref 5–15)
BUN: 19 mg/dL (ref 8–23)
CO2: 27 mmol/L (ref 22–32)
Calcium: 9.8 mg/dL (ref 8.9–10.3)
Chloride: 103 mmol/L (ref 98–111)
Creatinine, Ser: 0.77 mg/dL (ref 0.44–1.00)
GFR, Estimated: >60 mL/min (ref >60)
Glucose, Bld: 117 mg/dL — ABNORMAL HIGH (ref 70–99)
Potassium: 3.7 mmol/L (ref 3.5–5.1)
Sodium: 141 mmol/L (ref 135–145)

## 2023-06-10 LAB — TROPONIN I (HIGH SENSITIVITY)
Troponin I (High Sensitivity): 7 ng/L (ref <18)
Troponin I (High Sensitivity): 8 ng/L (ref <18)

## 2023-06-10 LAB — D-DIMER, QUANTITATIVE: D-Dimer, Quant: 0.3 ug{FEU}/mL (ref 0.00–0.50)

## 2023-06-10 MED ORDER — SODIUM CHLORIDE 0.9% FLUSH
3.0000 mL | Freq: Two times a day (BID) | INTRAVENOUS | Status: DC
  Administered 2023-06-10 – 2023-06-11 (×2): 3 mL via INTRAVENOUS

## 2023-06-10 MED ORDER — LIDOCAINE VISCOUS HCL 2 % MT SOLN: 15.0000 mL | Freq: Once | OROMUCOSAL | Status: DC

## 2023-06-10 MED ORDER — ACETAMINOPHEN 650 MG RE SUPP: 650.0000 mg | Freq: Four times a day (QID) | RECTAL | Status: DC | PRN

## 2023-06-10 MED ORDER — FAMOTIDINE 20 MG PO TABS
20.0000 mg | ORAL_TABLET | Freq: Every day | ORAL | Status: DC
  Administered 2023-06-10 – 2023-06-11 (×2): 20 mg via ORAL
  Filled 2023-06-10 (×2): qty 1

## 2023-06-10 MED ORDER — FENOFIBRATE 54 MG PO TABS
54.0000 mg | ORAL_TABLET | Freq: Every day | ORAL | Status: DC
  Administered 2023-06-11: 54 mg via ORAL
  Filled 2023-06-10: qty 1

## 2023-06-10 MED ORDER — ONDANSETRON HCL 4 MG PO TABS: 4.0000 mg | ORAL_TABLET | Freq: Four times a day (QID) | ORAL | Status: DC | PRN

## 2023-06-10 MED ORDER — NITROGLYCERIN 0.4 MG SL SUBL
0.4000 mg | SUBLINGUAL_TABLET | Freq: Once | SUBLINGUAL | Status: AC
  Administered 2023-06-10: 0.4 mg via SUBLINGUAL
  Filled 2023-06-10: qty 1

## 2023-06-10 MED ORDER — GABAPENTIN 300 MG PO CAPS
300.0000 mg | ORAL_CAPSULE | Freq: Every day | ORAL | Status: DC
  Administered 2023-06-10: 300 mg via ORAL
  Filled 2023-06-10: qty 1

## 2023-06-10 MED ORDER — EZETIMIBE 10 MG PO TABS
10.0000 mg | ORAL_TABLET | Freq: Every day | ORAL | Status: DC
  Administered 2023-06-11: 10 mg via ORAL
  Filled 2023-06-10: qty 1

## 2023-06-10 MED ORDER — ACETAMINOPHEN 500 MG PO TABS
1000.0000 mg | ORAL_TABLET | Freq: Once | ORAL | Status: AC
  Administered 2023-06-10: 1000 mg via ORAL
  Filled 2023-06-10: qty 2

## 2023-06-10 MED ORDER — ACETAMINOPHEN 325 MG PO TABS: 650.0000 mg | ORAL_TABLET | Freq: Four times a day (QID) | ORAL | Status: DC | PRN

## 2023-06-10 MED ORDER — SODIUM CHLORIDE 0.9 % IV SOLN: 250.0000 mL | INTRAVENOUS | Status: DC | PRN

## 2023-06-10 MED ORDER — ONDANSETRON HCL 4 MG/2ML IJ SOLN: 4.0000 mg | Freq: Four times a day (QID) | INTRAMUSCULAR | Status: DC | PRN

## 2023-06-10 MED ORDER — ALUM & MAG HYDROXIDE-SIMETH 200-200-20 MG/5ML PO SUSP: 30.0000 mL | Freq: Once | ORAL | Status: DC

## 2023-06-10 MED ORDER — SODIUM CHLORIDE 0.9% FLUSH
3.0000 mL | INTRAVENOUS | Status: DC | PRN
Start: 2023-06-10 — End: 2023-06-11

## 2023-06-10 MED ORDER — LEVOTHYROXINE SODIUM 88 MCG PO TABS
88.0000 ug | ORAL_TABLET | Freq: Every day | ORAL | Status: DC
  Administered 2023-06-11: 88 ug via ORAL
  Filled 2023-06-10: qty 1

## 2023-06-10 MED ORDER — ASPIRIN 81 MG PO TBEC
81.0000 mg | DELAYED_RELEASE_TABLET | Freq: Every day | ORAL | Status: DC
  Administered 2023-06-10 – 2023-06-11 (×2): 81 mg via ORAL
  Filled 2023-06-10 (×2): qty 1

## 2023-06-10 MED ORDER — ASPIRIN 81 MG PO TABS: 81.0000 mg | ORAL_TABLET | Freq: Every day | ORAL | Status: DC

## 2023-06-10 MED ORDER — ENOXAPARIN SODIUM 40 MG/0.4ML IJ SOSY
40.0000 mg | PREFILLED_SYRINGE | INTRAMUSCULAR | Status: DC
  Filled 2023-06-10: qty 0.4

## 2023-06-10 NOTE — ED Provider Triage Note (Signed)
 Emergency Medicine Provider Triage Evaluation Note  [name] , a 78 y.o. female  was evaluated in triage.  Pt complains of chest discomfort on Monday.  Pt reports weak since  Review of Systems  Positive: weakness  Negative:   Physical Exam  BP (!) 150/61   Pulse 63   Temp 98.3 F (36.8 C)   Resp 14   SpO2 98%  Gen:   Awake, no distress   Resp:  Normal effort  MSK:   Moves extremities without difficulty  Other:    Medical Decision Making  Medically screening exam initiated at 4:33 PM.  Appropriate orders placed.  [name] was informed that the remainder of the evaluation will be completed by another provider, this initial triage assessment does not replace that evaluation, and the importance of remaining in the ED until their evaluation is complete.     Sandi Crosby, PA-C 06/10/23 [phone]

## 2023-06-10 NOTE — ED Provider Notes (Signed)
 [place] EMERGENCY DEPARTMENT AT [facility] Provider Note  CSN: [id number] Arrival date & time: 06/10/23 1453  Chief Complaint(s) Chest Pain  HPI LAMIRACLE CHAIDEZ is a 78 y.o. female with PMH hypothyroidism, HLD, depression, HTN who presents emergency room for evaluation of chest pain.  She states that yesterday she was at [facility] and suddenly had substernal crushing chest pain with associated shortness of breath.  No associated nausea, vomiting or diaphoresis.  She states that she went home because she was feeling fatigued.  She fell asleep and woke up the following morning with persistent pain but it had improved in intensity.  10 out of 10 yesterday and is 6 out of 10 on arrival to the ER today.  Has not taken any medication prior to arrival.  Currently denies shortness of breath, abdominal pain, nausea, vomiting, headache, fever or other systemic symptoms.   Past Medical History Past Medical History:  Diagnosis Date   Vitamin D  deficiency    Patient Active Problem List   Diagnosis Date Noted   Chest pain 06/10/2023   History of depression 06/10/2023   Peripheral neuropathy 06/10/2023   Insomnia 05/29/2018   Hypothyroidism 04/13/2017   Benign paroxysmal positional vertigo 02/07/2016   BMI 29.0-29.9,adult 09/21/2014   Encounter for Medicare annual wellness exam 09/21/2014   Medication management 08/15/2013   Abnormal glucose 01/26/2013   Vitamin D  deficiency 12/13/2012   GERD    Hyperlipidemia    Depression, major, in remission (HCC)    Essential hypertension 09/09/2008   Home Medication(s) Prior to Admission medications   Medication Sig Start Date End Date Taking? Authorizing Provider  Acetaminophen (TYLENOL EXTRA STRENGTH PO) Take by mouth. Takes 2 at night   Yes [provider]  aspirin 81 MG tablet Take 81 mg by mouth daily.   Yes [provider]  Calcium  Carbonate (CALCIUM  500 PO) Take 1 tablet by mouth daily.   Yes [provider]  Cholecalciferol (VITAMIN D  PO) Take 2,000 Int'l Units by mouth 2 (two) times daily.    Yes [provider]  ezetimibe  (ZETIA ) 10 MG tablet Take       1 tablet      Daily       for Cholesterol 03/03/23  Yes Webb, Padonda B, FNP  fenofibrate  (TRICOR ) 145 MG tablet Take 1 tablet Daily for Triglycerides (Blood Fats ) 02/17/23  Yes Webb, Padonda B, FNP  furosemide  (LASIX ) 40 MG tablet TAKE 1 TABLET BY MOUTH TWICE A DAY FOR BLOOD PRESSURE AND FLUID Patient taking differently: Take 40 mg by mouth daily. 12/15/22  Yes Wilkinson, Dana E, FNP  gabapentin  (NEURONTIN ) 300 MG capsule Take 1 capsule (300 mg total) by mouth at bedtime. 03/03/23  Yes Webb, Padonda B, FNP  Ginkgo Biloba 30 MG CAPS as directed Orally   Yes [provider]  levothyroxine  (SYNTHROID ) 88 MCG tablet 1/2 tab 3 x / week  MWF &1 tablet 4 x / week TTHSS & Take on an empty stomach with only water for 30 minutes & no Antacid meds, Calcium  or Magnesium for 4 hours & avoid Biotin Patient taking differently: Take 88 mcg by mouth daily before breakfast. 09/17/22  Yes Vangie Genet, MD  Magnesium 500 MG TABS Take 1 tablet by mouth daily.   Yes [provider]  Multiple Vitamins-Minerals (MULTIVITAMIN WITH MINERALS) tablet Take 1 tablet by mouth daily.   Yes [provider]  vitamin C (ASCORBIC ACID) 500 MG tablet Take 1,000 mg by mouth  daily. Takes 2,000mg    Yes [provider]  famotidine  (PEPCID ) 20 MG tablet Take 1 tablet (20 mg total) by mouth daily. 11/17/19 11/16/20  McClanahan, Kyra, NP                                                                                                                                    Past Surgical History Past Surgical History:  Procedure Laterality Date   ABDOMINAL HYSTERECTOMY  1981   partial   APPENDECTOMY     BACK SURGERY     CESAREAN SECTION     SALPINGOOPHORECTOMY  1981 left   1982 right   Family History Family History  Problem Relation Age of  Onset   Diabetes Mother    Kidney disease Mother    Heart disease Mother    Hypertension Father    Heart disease Father    Heart disease Sister    Breast cancer Neg Hx     Social History Social History   Tobacco Use   Smoking status: Former    Current packs/day: 0.00    Average packs/day: 0.3 packs/day for 46.0 years (11.5 ttl pk-yrs)    Types: Cigarettes    Start date: 1964    Quit date: 01/07/2008    Years since quitting: 15.4   Smokeless tobacco: Never  Substance Use Topics   Alcohol use: No   Drug use: No   Allergies Aspirin, Fish oil, Morphine and codeine, Sulfa antibiotics, and Vicodin [hydrocodone-acetaminophen]  Review of Systems Review of Systems  Respiratory:  Positive for shortness of breath.   Cardiovascular:  Positive for chest pain.    Physical Exam Vital Signs  I have reviewed the triage vital signs BP (!) 142/64   Pulse (!) 58   Temp 98 F (36.7 C) (Oral)   Resp 13   SpO2 100%   Physical Exam Vitals and nursing note reviewed.  Constitutional:      General: She is not in acute distress.    Appearance: She is well-developed.  HENT:     Head: Normocephalic and atraumatic.  Eyes:     Conjunctiva/sclera: Conjunctivae normal.  Cardiovascular:     Rate and Rhythm: Normal rate and regular rhythm.     Heart sounds: No murmur heard. Pulmonary:     Effort: Pulmonary effort is normal. No respiratory distress.     Breath sounds: Normal breath sounds.  Abdominal:     Palpations: Abdomen is soft.     Tenderness: There is no abdominal tenderness.  Musculoskeletal:        General: No swelling.     Cervical back: Neck supple.  Skin:    General: Skin is warm and dry.     Capillary Refill: Capillary refill takes less than 2 seconds.  Neurological:     Mental Status: She is alert.  Psychiatric:        Mood and Affect: Mood normal.  ED Results and Treatments Labs (all labs ordered are listed, but only abnormal results are displayed) Labs  Reviewed  BASIC METABOLIC PANEL WITH GFR - Abnormal; Notable for the following components:      Result Value   Glucose, Bld 117 (*)    All other components within normal limits  CBC  D-DIMER, QUANTITATIVE  COMPREHENSIVE METABOLIC PANEL WITH GFR  CBC  TROPONIN I (HIGH SENSITIVITY)  TROPONIN I (HIGH SENSITIVITY)                                                                                                                          Radiology DG Chest 2 View Result Date: 06/10/2023 CLINICAL DATA:  Chest pain. EXAM: CHEST - 2 VIEW COMPARISON:  December 14, 2020. FINDINGS: The heart size and mediastinal contours are within normal limits. Both lungs are clear. The visualized skeletal structures are unremarkable. IMPRESSION: No active cardiopulmonary disease. Electronically Signed   By: Rosalene Colon M.D.   On: 06/10/2023 17:31    Pertinent labs & imaging results that were available during my care of the patient were reviewed by me and considered in my medical decision making (see MDM for details).  Medications Ordered in ED Medications  alum & mag hydroxide-simeth (MAALOX/MYLANTA) 200-200-20 MG/5ML suspension 30 mL (30 mLs Oral Not Given 06/10/23 1930)    And  lidocaine (XYLOCAINE) 2 % viscous mouth solution 15 mL (15 mLs Oral Not Given 06/10/23 1931)  ezetimibe  (ZETIA ) tablet 10 mg (has no administration in time range)  fenofibrate  tablet 54 mg (has no administration in time range)  levothyroxine  (SYNTHROID ) tablet 88 mcg (has no administration in time range)  famotidine  (PEPCID ) tablet 20 mg (has no administration in time range)  gabapentin  (NEURONTIN ) capsule 300 mg (has no administration in time range)  enoxaparin (LOVENOX) injection 40 mg (has no administration in time range)  sodium chloride flush (NS) 0.9 % injection 3 mL (has no administration in time range)  sodium chloride flush (NS) 0.9 % injection 3 mL (has no administration in time range)  sodium chloride flush (NS) 0.9 % injection  3 mL (has no administration in time range)  0.9 %  sodium chloride infusion (has no administration in time range)  acetaminophen (TYLENOL) tablet 650 mg (has no administration in time range)    Or  acetaminophen (TYLENOL) suppository 650 mg (has no administration in time range)  ondansetron (ZOFRAN) tablet 4 mg (has no administration in time range)    Or  ondansetron (ZOFRAN) injection 4 mg (has no administration in time range)  aspirin EC tablet 81 mg (has no administration in time range)  nitroGLYCERIN (NITROSTAT) SL tablet 0.4 mg (0.4 mg Sublingual Given 06/10/23 1734)  acetaminophen (TYLENOL) tablet 1,000 mg (1,000 mg Oral Given 06/10/23 1922)  Procedures Procedures  (including critical care time)  Medical Decision Making / ED Course   This patient presents to the ED for concern of chest pain, this involves an extensive number of treatment options, and is a complaint that carries with it a high risk of complications and morbidity.  The differential diagnosis includes ACS, Aortic Dissection, Pneumothorax, Pneumonia, Esophageal Rupture, PE, Tamponade/Pericardial Effusion, pericarditis, esophageal spasm, dysrhythmia, GERD, costochondritis.  MDM: Patient seen emergency room for evaluation of chest pain.  Physical exam is largely unremarkable with no appreciable rubs or murmurs, pulmonary exam normal, no lower extremity edema.  Laboratory evaluation is overall reassuring with negative high-sensitivity troponin and delta troponin, negative D-dimer.  Chest x-ray unremarkable.  ECG without evidence of ischemia.  Despite negative workup, patient's story is concerning for ischemia and thus I spoke with the cardiologist on-call Dr. Maximo Spar who is recommending hospitalist admission and routine cardiology consultation.  Symptoms improved with nitroglycerin and Tylenol.  Patient  admitted.   Additional history obtained: -Additional history obtained from son -External records from outside source obtained and reviewed including: Chart review including previous notes, labs, imaging, consultation notes   Lab Tests: -I ordered, reviewed, and interpreted labs.   The pertinent results include:   Labs Reviewed  BASIC METABOLIC PANEL WITH GFR - Abnormal; Notable for the following components:      Result Value   Glucose, Bld 117 (*)    All other components within normal limits  CBC  D-DIMER, QUANTITATIVE  COMPREHENSIVE METABOLIC PANEL WITH GFR  CBC  TROPONIN I (HIGH SENSITIVITY)  TROPONIN I (HIGH SENSITIVITY)      EKG   EKG Interpretation Date/Time:  Wednesday June 10 2023 16:06:18 EDT Ventricular Rate:  68 PR Interval:  164 QRS Duration:  116 QT Interval:  446 QTC Calculation: 474 R Axis:   135  Text Interpretation: Normal sinus rhythm Left bundle branch block Abnormal ECG When compared with ECG of 13-Jan-2008 15:23, PREVIOUS ECG IS PRESENT Confirmed by Chalet Kerwin ([id number]) on 06/10/2023 5:23:57 PM         Imaging Studies ordered: I ordered imaging studies including chest x-ray I independently visualized and interpreted imaging. I agree with the radiologist interpretation   Medicines ordered and prescription drug management: Meds ordered this encounter  Medications   nitroGLYCERIN (NITROSTAT) SL tablet 0.4 mg   acetaminophen (TYLENOL) tablet 1,000 mg   AND Linked Order Group    alum & mag hydroxide-simeth (MAALOX/MYLANTA) 200-200-20 MG/5ML suspension 30 mL    lidocaine (XYLOCAINE) 2 % viscous mouth solution 15 mL   DISCONTD: aspirin tablet 81 mg   ezetimibe  (ZETIA ) tablet 10 mg    Take       1 tablet      Daily       for Cholesterol     fenofibrate  tablet 54 mg   levothyroxine  (SYNTHROID ) tablet 88 mcg   famotidine  (PEPCID ) tablet 20 mg   gabapentin  (NEURONTIN ) capsule 300 mg   enoxaparin (LOVENOX) injection 40 mg   sodium chloride flush  (NS) 0.9 % injection 3 mL   sodium chloride flush (NS) 0.9 % injection 3 mL   sodium chloride flush (NS) 0.9 % injection 3 mL   0.9 %  sodium chloride infusion   OR Linked Order Group    acetaminophen (TYLENOL) tablet 650 mg    acetaminophen (TYLENOL) suppository 650 mg   OR Linked Order Group    ondansetron (ZOFRAN) tablet 4 mg    ondansetron (ZOFRAN) injection 4 mg  aspirin EC tablet 81 mg    -I have reviewed the patients home medicines and have made adjustments as needed  Critical interventions none  Consultations Obtained: I requested consultation with the cardiologist on-call Dr. Maximo Spar,  and discussed lab and imaging findings as well as pertinent plan - they recommend: Hospitalist admission, routine cardiology evaluation   Cardiac Monitoring: The patient was maintained on a cardiac monitor.  I personally viewed and interpreted the cardiac monitored which showed an underlying rhythm of: NSR  Social Determinants of Health:  Factors impacting patients care include: none   Reevaluation: After the interventions noted above, I reevaluated the patient and found that they have :improved  Co morbidities that complicate the patient evaluation  Past Medical History:  Diagnosis Date   Vitamin D  deficiency       Dispostion: I considered admission for this patient, and patient require hospital admission for high risk chest pain     Final Clinical Impression(s) / ED Diagnoses Final diagnoses:  Chest pain, unspecified type     @PCDICTATION @    Karlyn Overman, MD 06/10/23 2211

## 2023-06-10 NOTE — H&P (Signed)
 History and Physical    [name] HQI:[id number] DOB: [birth year] DOA: 06/10/2023  PCP: Espinoza, Alejandra, DO   Patient coming from: Home   Chief Complaint:  Chief Complaint  Patient presents with   Chest Pain   ED TRIAGE note:  Pt is here for evaluation of CP.  She states that on Monday she had a scary incident where she was walking into [employer] when she felt a sudden pain in her chest which felt like she was being hit in the chest.  Pt had sob with this and she felt a heaviness and generalized weakness.  Pt had had continued heaviness and fatigue since.      HPI:  [name] is a 78 y.o. female with medical history significant of essential hypertension, hyperlipidemia, prediabetes, hypothyroidism, statin induced myopathy, insomnia, depression, peripheral neuropathy, obesity, and vitamin D  deficiency presented emergency department complaining of chest pain and heaviness associated to generalized fatigue that started on Monday.Patient reported sudden onset of crushing mid substernal chest pain and left-sided arm tingling.  Patient reported exertion and moving makes the chest pain worse and deep breathing improved the chest pain.   Patient reported persistent chest pain for last 2 days and presented to ED for evaluation.  Patient denies any radiation, shortness of breath, nausea and diaphoresis.  No previous history of MI, CAD and stroke.  No other complaint at this time.   ED Course:  At presentation to ED patient is hemodynamically stable. Normal troponin level 7 and 8 without any delta change.  Normal D-dimer level. CBC unremarkable. BMP unremarkable. EKG showing normal sinus rhythm heart rate 68 and there is no ST-T wave abnormality. Chest x-ray no acute disease process.  With the concern for heartburn in the ED patient has been given heartburn cocktail.  In the ED patient has been given sublingual nitroglycerin and chest pain has been improved to 6 out of 10  intensity.  Dr. Jeannie Milo has been consulted spoke with cardiology Dr. Maximo Spar stated that will see patient in the a.m and cardiology did not mention any recommendation for stress test yet. Given patient has negative troponin no need for emergent cardiac intervention tonight.  Hospitalist has been consulted for management of new onset of chest pain.      Significant labs in the ED: Lab Orders         Basic metabolic panel         CBC         D-dimer, quantitative         Comprehensive metabolic panel         CBC       Review of Systems:  Review of Systems  Constitutional:  Positive for malaise/fatigue. Negative for chills, fever and weight loss.  Respiratory:  Negative for cough, sputum production and shortness of breath.   Cardiovascular:  Positive for chest pain. Negative for palpitations, claudication and leg swelling.       Medial substernal chest pain 5-6/10 intensity  Gastrointestinal:  Negative for abdominal pain, heartburn, nausea and vomiting.  Musculoskeletal:  Negative for myalgias and neck pain.  Neurological:  Negative for dizziness and headaches.  Psychiatric/Behavioral:  The patient is not nervous/anxious.     Past Medical History:  Diagnosis Date   Vitamin D  deficiency     Past Surgical History:  Procedure Laterality Date   ABDOMINAL HYSTERECTOMY  1981   partial   APPENDECTOMY     BACK SURGERY     CESAREAN SECTION  SALPINGOOPHORECTOMY  1981 left   1982 right     reports that she quit smoking about 15 years ago. Her smoking use included cigarettes. She started smoking about 61 years ago. She has a 11.5 pack-year smoking history. She has never used smokeless tobacco. She reports that she does not drink alcohol and does not use drugs.  Allergies  Allergen Reactions   Aspirin     REACTION: GI ulcers   Fish Oil     Allergic to fish oil and shell fish   Morphine And Codeine    Sulfa Antibiotics    Vicodin [Hydrocodone-Acetaminophen] Nausea Only     Family History  Problem Relation Age of Onset   Diabetes Mother    Kidney disease Mother    Heart disease Mother    Hypertension Father    Heart disease Father    Heart disease Sister    Breast cancer Neg Hx     Prior to Admission medications   Medication Sig Start Date End Date Taking? Authorizing Provider  Acetaminophen (TYLENOL EXTRA STRENGTH PO) Take by mouth. Takes 2 at night   Yes [provider]  aspirin 81 MG tablet Take 81 mg by mouth daily.   Yes [provider]  Calcium  Carbonate (CALCIUM  500 PO) Take 1 tablet by mouth daily.   Yes [provider]  Cholecalciferol (VITAMIN D  PO) Take 2,000 Int'l Units by mouth 2 (two) times daily.    Yes [provider]  ezetimibe  (ZETIA ) 10 MG tablet Take       1 tablet      Daily       for Cholesterol 03/03/23  Yes Webb, Padonda B, FNP  fenofibrate  (TRICOR ) 145 MG tablet Take 1 tablet Daily for Triglycerides (Blood Fats ) 02/17/23  Yes Webb, Padonda B, FNP  furosemide  (LASIX ) 40 MG tablet TAKE 1 TABLET BY MOUTH TWICE A DAY FOR BLOOD PRESSURE AND FLUID Patient taking differently: Take 40 mg by mouth daily. 12/15/22  Yes Wilkinson, Dana E, FNP  gabapentin  (NEURONTIN ) 300 MG capsule Take 1 capsule (300 mg total) by mouth at bedtime. 03/03/23  Yes Webb, Padonda B, FNP  Ginkgo Biloba 30 MG CAPS as directed Orally   Yes [provider]  levothyroxine  (SYNTHROID ) 88 MCG tablet 1/2 tab 3 x / week  MWF &1 tablet 4 x / week TTHSS & Take on an empty stomach with only water for 30 minutes & no Antacid meds, Calcium  or Magnesium for 4 hours & avoid Biotin Patient taking differently: Take 88 mcg by mouth daily before breakfast. 09/17/22  Yes Vangie Genet, MD  Magnesium 500 MG TABS Take 1 tablet by mouth daily.   Yes [provider]  Multiple Vitamins-Minerals (MULTIVITAMIN WITH MINERALS) tablet Take 1 tablet by mouth daily.   Yes [provider]  vitamin C (ASCORBIC ACID) 500 MG tablet Take  1,000 mg by mouth daily. Takes 2,000mg    Yes [provider]  famotidine  (PEPCID ) 20 MG tablet Take 1 tablet (20 mg total) by mouth daily. 11/17/19 11/16/20  Jewell Mose, NP     Physical Exam: Vitals:   06/10/23 1555 06/10/23 1905 06/10/23 2004  BP: (!) 150/61 (!) 142/64   Pulse: 63 (!) 58   Resp: 14 13   Temp: 98.3 F (36.8 C)  98 F (36.7 C)  TempSrc:   Oral  SpO2: 98% 100%     Physical Exam Vitals and nursing note reviewed.  Constitutional:      Appearance: She  is well-developed.  HENT:     Head: Normocephalic.  Cardiovascular:     Rate and Rhythm: Normal rate and regular rhythm.     Heart sounds: Normal heart sounds.  Pulmonary:     Effort: Pulmonary effort is normal.     Breath sounds: Normal breath sounds.  Abdominal:     Palpations: Abdomen is soft.  Musculoskeletal:     Cervical back: Neck supple.     Right lower leg: No edema.     Left lower leg: No edema.  Skin:    General: Skin is warm.     Capillary Refill: Capillary refill takes less than 2 seconds.  Neurological:     Mental Status: She is alert.      Labs on Admission: I have personally reviewed following labs and imaging studies  CBC: Recent Labs  Lab 06/10/23 1604  WBC 8.0  HGB 13.8  HCT 42.6  MCV 92.6  PLT 350   Basic Metabolic Panel: Recent Labs  Lab 06/10/23 1604  NA 141  K 3.7  CL 103  CO2 27  GLUCOSE 117*  BUN 19  CREATININE 0.77  CALCIUM  9.8   GFR: CrCl cannot be calculated (Unknown ideal weight.). Liver Function Tests: No results for input(s): "AST", "ALT", "ALKPHOS", "BILITOT", "PROT", "ALBUMIN" in the last 168 hours. No results for input(s): "LIPASE", "AMYLASE" in the last 168 hours. No results for input(s): "AMMONIA" in the last 168 hours. Coagulation Profile: No results for input(s): "INR", "PROTIME" in the last 168 hours. Cardiac Enzymes: Recent Labs  Lab 06/10/23 1604 06/10/23 2020  TROPONINIHS 7 8   BNP (last 3 results) No results for  input(s): "BNP" in the last 8760 hours. HbA1C: No results for input(s): "HGBA1C" in the last 72 hours. CBG: No results for input(s): "GLUCAP" in the last 168 hours. Lipid Profile: No results for input(s): "CHOL", "HDL", "LDLCALC", "TRIG", "CHOLHDL", "LDLDIRECT" in the last 72 hours. Thyroid  Function Tests: No results for input(s): "TSH", "T4TOTAL", "FREET4", "T3FREE", "THYROIDAB" in the last 72 hours. Anemia Panel: No results for input(s): "VITAMINB12", "FOLATE", "FERRITIN", "TIBC", "IRON", "RETICCTPCT" in the last 72 hours. Urine analysis:    Component Value Date/Time   COLORURINE YELLOW 03/27/2022 1048   APPEARANCEUR CLEAR 03/27/2022 1048   LABSPEC 1.003 03/27/2022 1048   PHURINE 7.5 03/27/2022 1048   GLUCOSEU NEGATIVE 03/27/2022 1048   HGBUR NEGATIVE 03/27/2022 1048   BILIRUBINUR NEGATIVE 06/11/2016 1617   KETONESUR NEGATIVE 03/27/2022 1048   PROTEINUR NEGATIVE 03/27/2022 1048   NITRITE NEGATIVE 03/27/2022 1048   LEUKOCYTESUR 2+ (A) 03/27/2022 1048    Radiological Exams on Admission: I have personally reviewed images DG Chest 2 View Result Date: 06/10/2023 CLINICAL DATA:  Chest pain. EXAM: CHEST - 2 VIEW COMPARISON:  December 14, 2020. FINDINGS: The heart size and mediastinal contours are within normal limits. Both lungs are clear. The visualized skeletal structures are unremarkable. IMPRESSION: No active cardiopulmonary disease. Electronically Signed   By: Rosalene Colon M.D.   On: 06/10/2023 17:31     EKG: My personal interpretation of EKG shows: Normal sinus rhythm heart rate 68, right axis deviation.  There is no ST-T wave abnormality   Assessment/Plan: Principal Problem:   Chest pain Active Problems:   Essential hypertension   Hyperlipidemia   Hypothyroidism   History of depression   Peripheral neuropathy    Assessment and Plan: Chest pain Acute coronary syndrome ruled out -Patient present emergency department complaining of sudden onset of crushing chest pain  with radiation  to the back that started on Monday 06/08/2023.  Patient also reports associated generalized weakness and fatigue.  Denies any diaphoresis and nausea.  No previous history of coronary artery disease. -Normal troponin x 2 and D-dimer level.  EKG showing normal sinus rhythm heart rate 68 and there is no history of abnormality.  Acute coronary syndrome has been ruled out. -Chest x-ray no acute cardiopulmonary abnormality. -Cardiology Dr. Maximo Spar has been consulted by ED physician recommended admit patient and will see patient in the morning.  Given patient has normal troponin and EKG finding no need for intervention/heart cath. - In the ED after giving GI cocktail and nitroglycerin patient reported chest pain has been improved 6 out of 10. -Continue aspirin 81 mg daily, ezetimibe  and fenofibrate  - Continue to trend troponin.  Obtaining echocardiogram to rule this out any wall motion abnormality. - Will defer further management and decide to obtain stress test to  cardiology team in the a.m.  Essential hypertension -Blood pressure within good range.  At home patient is not any antihypertensive agent.  Hyperlipidemia -Continue ezetimibe  and fenofibrate .  Hypothyroidism Continue levothyroxine   History of depression -Not on any antidepressant medication at home  Peripheral neuropathy -Continue gabapentin   History of GERD -Continue famotidine    DVT prophylaxis:  Lovenox Code Status:  Full Code Diet: Heart healthy carb modified diet Family Communication:   Family was present at bedside, at the time of interview. Opportunity was given to ask question and all questions were answered satisfactorily.  Disposition Plan: Continue to monitor development of any chest pain and trending troponin. Consult: Cardiology Admission status:   Observation, Telemetry bed  Severity of Illness: The appropriate patient status for this patient is OBSERVATION. Observation status is judged to be  reasonable and necessary in order to provide the required intensity of service to ensure the patient's safety. The patient's presenting symptoms, physical exam findings, and initial radiographic and laboratory data in the context of their medical condition is felt to place them at decreased risk for further clinical deterioration. Furthermore, it is anticipated that the patient will be medically stable for discharge from the hospital within 2 midnights of admission.     Brody Kump, MD [facility]  How to contact the [facility] Attending or Consulting provider 7A - 7P or covering provider during after hours 7P -7A, for this patient.  Check the care team in [facility] and look for a) attending/consulting [facility] provider listed and b) the [facility] team listed Log into [email or web address] and use [place]'s universal password to access. If you do not have the password, please contact the hospital operator. Locate the [facility] provider you are looking for under [facility] and page to a number that you can be directly reached. If you still have difficulty reaching the provider, please page the [facility] (Director on Call) for the Hospitalists listed on amion for assistance.  06/10/2023, 9:58 PM

## 2023-06-10 NOTE — ED Notes (Signed)
 Patient transported to X-ray

## 2023-06-10 NOTE — ED Triage Notes (Signed)
 Pt is here for evaluation of CP.  She states that on Monday she had a scary incident where she was walking into [employer] when she felt a sudden pain in her chest which felt like she was being hit in the chest.  Pt had sob with this and she felt a heaviness and generalized weakness.  Pt had had continued heaviness and fatigue since.

## 2023-06-10 NOTE — ED Notes (Signed)
 Pt ambulated to restroom.

## 2023-06-11 ENCOUNTER — Observation Stay (HOSPITAL_BASED_OUTPATIENT_CLINIC_OR_DEPARTMENT_OTHER)

## 2023-06-11 ENCOUNTER — Other Ambulatory Visit (HOSPITAL_COMMUNITY): Payer: Self-pay

## 2023-06-11 ENCOUNTER — Other Ambulatory Visit (HOSPITAL_COMMUNITY)

## 2023-06-11 DIAGNOSIS — R072 Precordial pain: Secondary | ICD-10-CM

## 2023-06-11 LAB — COMPREHENSIVE METABOLIC PANEL WITH GFR
ALT: 23 U/L (ref 0–44)
AST: 26 U/L (ref 15–41)
Albumin: 3.9 g/dL (ref 3.5–5.0)
Alkaline Phosphatase: 40 U/L (ref 38–126)
Anion gap: 10 (ref 5–15)
BUN: 18 mg/dL (ref 8–23)
CO2: 25 mmol/L (ref 22–32)
Calcium: 9.2 mg/dL (ref 8.9–10.3)
Chloride: 103 mmol/L (ref 98–111)
Creatinine, Ser: 0.66 mg/dL (ref 0.44–1.00)
GFR, Estimated: >60 mL/min (ref >60)
Glucose, Bld: 92 mg/dL (ref 70–99)
Potassium: 3.3 mmol/L — ABNORMAL LOW (ref 3.5–5.1)
Sodium: 138 mmol/L (ref 135–145)
Total Bilirubin: 0.5 mg/dL (ref 0.0–1.2)
Total Protein: 6.6 g/dL (ref 6.5–8.1)

## 2023-06-11 LAB — ECHOCARDIOGRAM COMPLETE
Area-P 1/2: 3.16 cm2
Height: 61 in
S' Lateral: 2.4 cm
Weight: 2758.4 [oz_av]

## 2023-06-11 LAB — TROPONIN I (HIGH SENSITIVITY)
Troponin I (High Sensitivity): 7 ng/L (ref <18)
Troponin I (High Sensitivity): 7 ng/L (ref <18)

## 2023-06-11 LAB — CBC
HCT: 37 % (ref 36.0–46.0)
Hemoglobin: 12.5 g/dL (ref 12.0–15.0)
MCH: 31.5 pg (ref 26.0–34.0)
MCHC: 33.8 g/dL (ref 30.0–36.0)
MCV: 93.2 fL (ref 80.0–100.0)
Platelets: 289 10*3/uL (ref 150–400)
RBC: 3.97 MIL/uL (ref 3.87–5.11)
RDW: 13.8 % (ref 11.5–15.5)
WBC: 7 10*3/uL (ref 4.0–10.5)
nRBC: 0 % (ref 0.0–0.2)

## 2023-06-11 LAB — MAGNESIUM: Magnesium: 2.1 mg/dL (ref 1.7–2.4)

## 2023-06-11 MED ORDER — POTASSIUM CHLORIDE CRYS ER 20 MEQ PO TBCR
40.0000 meq | EXTENDED_RELEASE_TABLET | ORAL | Status: AC
  Administered 2023-06-11 (×2): 40 meq via ORAL
  Filled 2023-06-11 (×2): qty 2

## 2023-06-11 MED ORDER — OMEPRAZOLE 40 MG PO CPDR
40.0000 mg | DELAYED_RELEASE_CAPSULE | Freq: Two times a day (BID) | ORAL | 0 refills | Status: AC
  Filled 2023-06-11: qty 60, 30d supply, fill #0

## 2023-06-11 NOTE — Discharge Summary (Signed)
 Physician Discharge Summary  ARNETHA SILVERTHORNE WUJ:[id number] DOB: [birth year] DOA: 06/10/2023  PCP: Espinoza, Alejandra, DO  Admit date: 06/10/2023 Discharge date: 06/11/2023    Admitted From: Home Disposition: Home  Recommendations for Outpatient Follow-up:  Follow up with PCP in 1-2 weeks Please obtain BMP/CBC in one week Please follow up with your PCP on the following pending results: Unresulted Labs (From admission, onward)    None         Home Health: None Equipment/Devices: None  Discharge Condition: Stable CODE STATUS: Full code Diet recommendation: Cardiac  ED TRIAGE note:  Pt is here for evaluation of CP.  She states that on Monday she had a scary incident where she was walking into [employer] when she felt a sudden pain in her chest which felt like she was being hit in the chest.  Pt had sob with this and she felt a heaviness and generalized weakness.  Pt had had continued heaviness and fatigue since.      Due to brief hospitalization and for better understanding, I have copied admitting hospitalist HPI and ED course as below. HPI:  [name] is a 78 y.o. female with medical history significant of essential hypertension, hyperlipidemia, prediabetes, hypothyroidism, statin induced myopathy, insomnia, depression, peripheral neuropathy, obesity, and vitamin D  deficiency presented emergency department complaining of chest pain and heaviness associated to generalized fatigue that started on Monday.Patient reported sudden onset of crushing mid substernal chest pain and left-sided arm tingling.  Patient reported exertion and moving makes the chest pain worse and deep breathing improved the chest pain.   Patient reported persistent chest pain for last 2 days and presented to ED for evaluation.  Patient denies any radiation, shortness of breath, nausea and diaphoresis.  No previous history of MI, CAD and stroke.  No other complaint at this time.     ED Course:  At  presentation to ED patient is hemodynamically stable. Normal troponin level 7 and 8 without any delta change.  Normal D-dimer level. CBC unremarkable. BMP unremarkable. EKG showing normal sinus rhythm heart rate 68 and there is no ST-T wave abnormality. Chest x-ray no acute disease process.   With the concern for heartburn in the ED patient has been given heartburn cocktail.   In the ED patient has been given sublingual nitroglycerin and chest pain has been improved to 6 out of 10 intensity.  Dr. Jeannie Milo has been consulted spoke with cardiology Dr. Maximo Spar stated that will see patient in the a.m and cardiology did not mention any recommendation for stress test yet. Given patient has negative troponin no need for emergent cardiac intervention tonight.   Hospitalist has been consulted for management of new onset of chest pain.    Subjective: Seen and examined this morning.  Patient was still complaining of chest " pressure".  But improved compared to yesterday.  No shortness of breath at rest.  No dizziness, diaphoresis or nausea.  Brief/Interim Summary: Patient was admitted for chest pain although she was already ruled out of ACS with negative troponins.  The case was discussed with cardiology.  Per notes, cardiology was going to see this patient this morning.  I sent message to Dr. Maximo Spar who was contacted by the ER last night and per him, he did not commit to consulting on the patient.  Regardless, he recommended to check echo and consult cardiology actually if there is any abnormality of the echo, and not to place or if there is lung.  Echo was completed which  showed normal ejection fraction and grade 1 diastolic dysfunction with no WMA.  Cardiac enzymes are completely negative.  D-dimer negative ruling out PE as well.  With her being fully hemodynamically stable except mild bradycardia which was transient and has improved and with normal CBC, BMP, cardiac enzymes as well as blood pressures, I do not  have any suspicion of aortic dissection.  She has point tenderness on the anterior chest as well.  Patient clinically appears to be anxious but she denies any anxiety.  Could be a nerve pain.  I have requested Dr. Maximo Spar to arrange outpatient cardiology follow-up and possibly stress test as outpatient.  I have discussed discharge plan with the patient.  She is in agreement.  Discharge plan was discussed with patient and/or family member and they verbalized understanding and agreed with it.  Discharge Diagnoses:  Principal Problem:   Chest pain Active Problems:   Essential hypertension   Hyperlipidemia   Hypothyroidism   History of depression   Peripheral neuropathy    Discharge Instructions   Allergies as of 06/11/2023       Reactions   Aspirin    REACTION: GI ulcers   Fish Oil    Allergic to fish oil and shell fish   Morphine And Codeine    Sulfa Antibiotics    Vicodin [hydrocodone-acetaminophen] Nausea Only        Medication List     TAKE these medications    ascorbic acid 500 MG tablet Commonly known as: VITAMIN C Take 1,000 mg by mouth daily. Takes 2,000mg    aspirin 81 MG tablet Take 81 mg by mouth daily.   CALCIUM  500 PO Take 1 tablet by mouth daily.   ezetimibe  10 MG tablet Commonly known as: ZETIA  Take       1 tablet      Daily       for Cholesterol   famotidine  20 MG tablet Commonly known as: PEPCID  Take 1 tablet (20 mg total) by mouth daily.   fenofibrate  145 MG tablet Commonly known as: TRICOR  Take 1 tablet Daily for Triglycerides (Blood Fats )   furosemide  40 MG tablet Commonly known as: LASIX  TAKE 1 TABLET BY MOUTH TWICE A DAY FOR BLOOD PRESSURE AND FLUID What changed:  how much to take how to take this when to take this additional instructions   gabapentin  300 MG capsule Commonly known as: NEURONTIN  Take 1 capsule (300 mg total) by mouth at bedtime.   Ginkgo Biloba 30 MG Caps as directed Orally   levothyroxine  88 MCG  tablet Commonly known as: SYNTHROID  1/2 tab 3 x / week  MWF &1 tablet 4 x / week TTHSS & Take on an empty stomach with only water for 30 minutes & no Antacid meds, Calcium  or Magnesium for 4 hours & avoid Biotin What changed:  how much to take how to take this when to take this additional instructions   Magnesium 500 MG Tabs Take 1 tablet by mouth daily.   multivitamin with minerals tablet Take 1 tablet by mouth daily.   omeprazole 40 MG capsule Commonly known as: PRILOSEC Take 1 capsule (40 mg total) by mouth 2 (two) times daily.   TYLENOL EXTRA STRENGTH PO Take by mouth. Takes 2 at night   VITAMIN D  PO Take 2,000 Int'l Units by mouth 2 (two) times daily.        Follow-up Information     Espinoza, Alejandra, DO Follow up in 1 week(s).   Specialty: Family Medicine  Contact information: [address] [place] [address] [phone]                Allergies  Allergen Reactions   Aspirin     REACTION: GI ulcers   Fish Oil     Allergic to fish oil and shell fish   Morphine And Codeine    Sulfa Antibiotics    Vicodin [Hydrocodone-Acetaminophen] Nausea Only    Consultations: Cardiology   Procedures/Studies: ECHOCARDIOGRAM COMPLETE Result Date: 06/11/2023    ECHOCARDIOGRAM REPORT   Patient Name:   [name] Date of Exam: 06/11/2023 Medical Rec #:  [id number]         Height:       61.0 in Accession #:    [id number]        Weight:       172.4 lb Date of Birth:  [birth year]        BSA:          1.773 m Patient Age:    77 years          BP:           124/62 mmHg Patient Gender: F                 HR:           64 bpm. Exam Location:  Inpatient Procedure: 2D Echo, Cardiac Doppler and Color Doppler (Both Spectral and Color            Flow Doppler were utilized during procedure). Indications:    Chest Pain  History:        Patient has no prior history of Echocardiogram examinations.                 Risk Factors:Hypertension.  Sonographer:     Janette Medley Referring Phys: [id number] SUBRINA SUNDIL IMPRESSIONS  1. Left ventricular ejection fraction, by estimation, is 65 to 70%. The left ventricle has normal function. The left ventricle has no regional wall motion abnormalities. Left ventricular diastolic parameters are consistent with Grade I diastolic dysfunction (impaired relaxation).  2. Right ventricular systolic function is normal. The right ventricular size is normal. There is normal pulmonary artery systolic pressure. The estimated right ventricular systolic pressure is 23.6 mmHg.  3. The mitral valve is normal in structure. No evidence of mitral valve regurgitation. No evidence of mitral stenosis.  4. The aortic valve is grossly normal. Aortic valve regurgitation is not visualized. No aortic stenosis is present.  5. The inferior vena cava is normal in size with greater than 50% respiratory variability, suggesting right atrial pressure of 3 mmHg. FINDINGS  Left Ventricle: Left ventricular ejection fraction, by estimation, is 65 to 70%. The left ventricle has normal function. The left ventricle has no regional wall motion abnormalities. The left ventricular internal cavity size was normal in size. There is  no left ventricular hypertrophy. Left ventricular diastolic parameters are consistent with Grade I diastolic dysfunction (impaired relaxation). Right Ventricle: The right ventricular size is normal. No increase in right ventricular wall thickness. Right ventricular systolic function is normal. There is normal pulmonary artery systolic pressure. The tricuspid regurgitant velocity is 2.27 m/s, and  with an assumed right atrial pressure of 3 mmHg, the estimated right ventricular systolic pressure is 23.6 mmHg. Left Atrium: Left atrial size was normal in size. Right Atrium: Right atrial size was normal in size. Pericardium: There is no evidence of pericardial effusion. Mitral Valve: The mitral valve is normal  in structure. No evidence of mitral valve  regurgitation. No evidence of mitral valve stenosis. Tricuspid Valve: The tricuspid valve is normal in structure. Tricuspid valve regurgitation is trivial. No evidence of tricuspid stenosis. Aortic Valve: The aortic valve is grossly normal. Aortic valve regurgitation is not visualized. No aortic stenosis is present. Pulmonic Valve: The pulmonic valve was normal in structure. Pulmonic valve regurgitation is not visualized. No evidence of pulmonic stenosis. Aorta: The aortic root is normal in size and structure. Venous: The inferior vena cava is normal in size with greater than 50% respiratory variability, suggesting right atrial pressure of 3 mmHg. IAS/Shunts: No atrial level shunt detected by color flow Doppler.  LEFT VENTRICLE PLAX 2D LVIDd:         3.90 cm   Diastology LVIDs:         2.40 cm   LV e' medial:    12.10 cm/s LV PW:         0.80 cm   LV E/e' medial:  8.3 LV IVS:        0.80 cm   LV e' lateral:   13.30 cm/s LVOT diam:     1.90 cm   LV E/e' lateral: 7.6 LV SV:         79 LV SV Index:   45 LVOT Area:     2.84 cm  RIGHT VENTRICLE             IVC RV S prime:     12.00 cm/s  IVC diam: 1.60 cm TAPSE (M-mode): 2.3 cm LEFT ATRIUM             Index        RIGHT ATRIUM           Index LA diam:        2.90 cm 1.64 cm/m   RA Area:     11.00 cm LA Vol (A2C):   31.9 ml 17.99 ml/m  RA Volume:   21.40 ml  12.07 ml/m LA Vol (A4C):   29.2 ml 16.47 ml/m LA Biplane Vol: 32.2 ml 18.16 ml/m  AORTIC VALVE LVOT Vmax:   123.00 cm/s LVOT Vmean:  84.100 cm/s LVOT VTI:    0.280 m  AORTA Ao Root diam: 2.30 cm Ao Asc diam:  2.60 cm MITRAL VALVE                TRICUSPID VALVE MV Area (PHT): 3.16 cm     TR Peak grad:   20.6 mmHg MV Decel Time: 240 msec     TR Vmax:        227.00 cm/s MV E velocity: 101.00 cm/s MV A velocity: 124.00 cm/s  SHUNTS MV E/A ratio:  0.81         Systemic VTI:  0.28 m                             Systemic Diam: 1.90 cm Dorothye Gathers MD Electronically signed by Dorothye Gathers MD Signature Date/Time:  06/11/2023/12:19:41 PM    Final    DG Chest 2 View Result Date: 06/10/2023 CLINICAL DATA:  Chest pain. EXAM: CHEST - 2 VIEW COMPARISON:  December 14, 2020. FINDINGS: The heart size and mediastinal contours are within normal limits. Both lungs are clear. The visualized skeletal structures are unremarkable. IMPRESSION: No active cardiopulmonary disease. Electronically Signed   By: Rosalene Colon M.D.   On: 06/10/2023 17:31   MM 3D SCREENING  MAMMOGRAM BILATERAL BREAST Result Date: 06/10/2023 CLINICAL DATA:  Screening. EXAM: DIGITAL SCREENING BILATERAL MAMMOGRAM WITH TOMOSYNTHESIS AND CAD TECHNIQUE: Bilateral screening digital craniocaudal and mediolateral oblique mammograms were obtained. Bilateral screening digital breast tomosynthesis was performed. The images were evaluated with computer-aided detection. COMPARISON:  Previous exam(s). ACR Breast Density Category b: There are scattered areas of fibroglandular density. FINDINGS: There are no findings suspicious for malignancy. IMPRESSION: No mammographic evidence of malignancy. A result letter of this screening mammogram will be mailed directly to the patient. RECOMMENDATION: Screening mammogram in one year. (Code:SM-B-01Y) BI-RADS CATEGORY  1: Negative. Electronically Signed   By: Sande Cromer M.D.   On: 06/10/2023 09:47     Discharge Exam: Vitals:   06/11/23 1130 06/11/23 1145  BP: 124/62 138/65  Pulse: (!) 59 65  Resp: 16 14  Temp:    SpO2: 99% 100%   Vitals:   06/11/23 1000 06/11/23 1045 06/11/23 1130 06/11/23 1145  BP: 121/64 128/62 124/62 138/65  Pulse: 63 61 (!) 59 65  Resp: 18 18 16 14   Temp:      TempSrc:      SpO2: 100% 97% 99% 100%  Weight:      Height:        General: Pt is alert, awake, not in acute distress Cardiovascular: RRR, S1/S2 +, no rubs, no gallops Respiratory: CTA bilaterally, no wheezing, no rhonchi, point tenderness at the left anterior chest Abdominal: Soft, NT, ND, bowel sounds + Extremities: no edema, no  cyanosis    The results of significant diagnostics from this hospitalization (including imaging, microbiology, ancillary and laboratory) are listed below for reference.     Microbiology: No results found for this or any previous visit (from the past 240 hours).   Labs: BNP (last 3 results) No results for input(s): "BNP" in the last 8760 hours. Basic Metabolic Panel: Recent Labs  Lab 06/10/23 1604 06/11/23 0230 06/11/23 0432  NA 141 138  --   K 3.7 3.3*  --   CL 103 103  --   CO2 27 25  --   GLUCOSE 117* 92  --   BUN 19 18  --   CREATININE 0.77 0.66  --   CALCIUM  9.8 9.2  --   MG  --   --  2.1   Liver Function Tests: Recent Labs  Lab 06/11/23 0230  AST 26  ALT 23  ALKPHOS 40  BILITOT 0.5  PROT 6.6  ALBUMIN 3.9   No results for input(s): "LIPASE", "AMYLASE" in the last 168 hours. No results for input(s): "AMMONIA" in the last 168 hours. CBC: Recent Labs  Lab 06/10/23 1604 06/11/23 0230  WBC 8.0 7.0  HGB 13.8 12.5  HCT 42.6 37.0  MCV 92.6 93.2  PLT 350 289   Cardiac Enzymes: No results for input(s): "CKTOTAL", "CKMB", "CKMBINDEX", "TROPONINI" in the last 168 hours. BNP: Invalid input(s): "POCBNP" CBG: No results for input(s): "GLUCAP" in the last 168 hours. D-Dimer Recent Labs    06/10/23 2020  DDIMER 0.30   Hgb A1c No results for input(s): "HGBA1C" in the last 72 hours. Lipid Profile No results for input(s): "CHOL", "HDL", "LDLCALC", "TRIG", "CHOLHDL", "LDLDIRECT" in the last 72 hours. Thyroid  function studies No results for input(s): "TSH", "T4TOTAL", "T3FREE", "THYROIDAB" in the last 72 hours.  Invalid input(s): "FREET3" Anemia work up No results for input(s): "VITAMINB12", "FOLATE", "FERRITIN", "TIBC", "IRON", "RETICCTPCT" in the last 72 hours. Urinalysis    Component Value Date/Time   COLORURINE YELLOW 03/27/2022 1048  APPEARANCEUR CLEAR 03/27/2022 1048   LABSPEC 1.003 03/27/2022 1048   PHURINE 7.5 03/27/2022 1048   GLUCOSEU  NEGATIVE 03/27/2022 1048   HGBUR NEGATIVE 03/27/2022 1048   BILIRUBINUR NEGATIVE 06/11/2016 1617   KETONESUR NEGATIVE 03/27/2022 1048   PROTEINUR NEGATIVE 03/27/2022 1048   NITRITE NEGATIVE 03/27/2022 1048   LEUKOCYTESUR 2+ (A) 03/27/2022 1048   Sepsis Labs Recent Labs  Lab 06/10/23 1604 06/11/23 0230  WBC 8.0 7.0   Microbiology No results found for this or any previous visit (from the past 240 hours).  FURTHER DISCHARGE INSTRUCTIONS:   Get Medicines reviewed and adjusted: Please take all your medications with you for your next visit with your Primary MD   Laboratory/radiological data: Please request your Primary MD to go over all hospital tests and procedure/radiological results at the follow up, please ask your Primary MD to get all Hospital records sent to his/her office.   In some cases, they will be blood work, cultures and biopsy results pending at the time of your discharge. Please request that your primary care M.D. goes through all the records of your hospital data and follows up on these results.   Also Note the following: If you experience worsening of your admission symptoms, develop shortness of breath, life threatening emergency, suicidal or homicidal thoughts you must seek medical attention immediately by calling 911 or calling your MD immediately  if symptoms less severe.   You must read complete instructions/literature along with all the possible adverse reactions/side effects for all the Medicines you take and that have been prescribed to you. Take any new Medicines after you have completely understood and accpet all the possible adverse reactions/side effects.    patient was instructed, not to drive, operate heavy machinery, perform activities at heights, swimming or participation in water activities or provide baby-sitting services while on Pain, Sleep and Anxiety Medications; until their outpatient Physician has advised to do so again. Also recommended to not to  take more than prescribed Pain, Sleep and Anxiety Medications.  It is not advisable to combine anxiety, sleep and pain medications without talking with your primary care provider.     Wear Seat belts while driving.   Please note: You were cared for by a hospitalist during your hospital stay. Once you are discharged, your primary care physician will handle any further medical issues. Please note that NO REFILLS for any discharge medications will be authorized once you are discharged, as it is imperative that you return to your primary care physician (or establish a relationship with a primary care physician if you do not have one) for your post hospital discharge needs so that they can reassess your need for medications and monitor your lab values  Time coordinating discharge: Over 30 minutes  SIGNED:   Modena Andes, MD  [facility] 06/11/2023, 12:22 PM *Please note that this is a verbal dictation therefore any spelling or grammatical errors are due to the "Dragon [employer] One" system interpretation. If 7PM-7AM, please contact night-coverage [email or web address]

## 2023-06-11 NOTE — ED Notes (Signed)
 Called and left message with ECHO in regards to estimated time for patient to get ECHO

## 2023-06-11 NOTE — ED Notes (Signed)
Patient ambulated to the bathroom with assistance.

## 2023-06-18 DIAGNOSIS — E876 Hypokalemia: Secondary | ICD-10-CM | POA: Diagnosis not present

## 2023-06-18 DIAGNOSIS — E785 Hyperlipidemia, unspecified: Secondary | ICD-10-CM | POA: Diagnosis not present

## 2023-06-18 DIAGNOSIS — G2581 Restless legs syndrome: Secondary | ICD-10-CM | POA: Diagnosis not present

## 2023-06-18 DIAGNOSIS — E039 Hypothyroidism, unspecified: Secondary | ICD-10-CM | POA: Diagnosis not present

## 2023-06-30 ENCOUNTER — Ambulatory Visit: Attending: Internal Medicine | Admitting: Internal Medicine

## 2023-06-30 ENCOUNTER — Encounter: Payer: Self-pay | Admitting: Internal Medicine

## 2023-06-30 VITALS — BP 161/76 | HR 76 | Ht 61.0 in | Wt 164.0 lb

## 2023-06-30 DIAGNOSIS — I10 Essential (primary) hypertension: Secondary | ICD-10-CM | POA: Diagnosis not present

## 2023-06-30 DIAGNOSIS — E782 Mixed hyperlipidemia: Secondary | ICD-10-CM | POA: Diagnosis not present

## 2023-06-30 DIAGNOSIS — R079 Chest pain, unspecified: Secondary | ICD-10-CM

## 2023-06-30 MED ORDER — METOPROLOL TARTRATE 50 MG PO TABS: 50.0000 mg | ORAL_TABLET | Freq: Once | ORAL | 0 refills | Status: AC

## 2023-06-30 NOTE — Patient Instructions (Signed)
 Medication Instructions:  Take Metoprolol Tartrate (Lopressor) 50 mg 2 hours before CT *If you need a refill on your cardiac medications before your next appointment, please call your pharmacy*  Testing/Procedures:   Your cardiac CT will be scheduled at one of the below locations:   [facility] [address] [place], [place] [address] [phone]  OR  [facility] [address] [place], [place] [address] [phone]  OR   [facility] [address] [place], [place] [address] [phone]  If scheduled at [facility], please arrive at the [facility] and Children's Entrance (Entrance C2) of [facility] 30 minutes prior to test start time. You can use the FREE valet parking offered at entrance C (encouraged to control the heart rate for the test)  Proceed to the [facility] Radiology Department (first floor) to check-in and test prep.  All radiology patients and guests should use entrance C2 at [facility], accessed from [facility], even though the hospital's physical address listed is [address]    If scheduled at [facility] or [facility], please arrive 15 mins early for check-in and test prep.  There is spacious parking and easy access to the radiology department from the [facility] entrance. Please enter here and check-in with the desk attendant.   Please follow these instructions carefully (unless otherwise directed):  An IV will be required for this test and Nitroglycerin  will be given.  Hold all erectile dysfunction medications at least 3 days (72 hrs) prior to test. (Ie viagra, cialis, sildenafil, tadalafil, etc)   On the Night Before the Test: Be sure to Drink plenty of water. Do not consume any caffeinated/decaffeinated beverages or chocolate 12 hours prior to your  test. Do not take any antihistamines 12 hours prior to your test.  On the Day of the Test: Drink plenty of water until 1 hour prior to the test. Do not eat any food 1 hour prior to test. You may take your regular medications prior to the test.  Take metoprolol (Lopressor) two hours prior to test. If you take Furosemide /Hydrochlorothiazide/Spironolactone/Chlorthalidone, please HOLD on the morning of the test. Patients who wear a continuous glucose monitor MUST remove the device prior to scanning. FEMALES- please wear underwire-free bra if available, avoid dresses & tight clothing      After the Test: Drink plenty of water. After receiving IV contrast, you may experience a mild flushed feeling. This is normal. On occasion, you may experience a mild rash up to 24 hours after the test. This is not dangerous. If this occurs, you can take Benadryl 25 mg and increase your fluid intake. If you experience trouble breathing, this can be serious. If it is severe call 911 IMMEDIATELY. If it is mild, please call our office.  We will call to schedule your test 2-4 weeks out understanding that some insurance companies will need an authorization prior to the service being performed.   For more information and frequently asked questions, please visit our website : [email or web address]  For non-scheduling related questions, please contact the cardiac imaging nurse navigator should you have any questions/concerns: Cardiac Imaging Nurse Navigators Direct Office Dial: [phone]   For scheduling needs, including cancellations and rescheduling, please call [place], [phone].   Follow-Up: At [facility], you and your health needs are our priority.  As part of our continuing mission to provide you  with exceptional heart care, our providers are all part of one team.  This team includes your primary Cardiologist (physician) and Advanced Practice Providers or APPs (Physician  Assistants and Nurse Practitioners) who all work together to provide you with the care you need, when you need it.  Your next appointment:   Pending Coronary CT

## 2023-07-01 NOTE — Progress Notes (Signed)
 OFFICE NOTE  Chief Complaint:  Hospital follow-up  Primary Care Physician: Espinoza, Alejandra, DO  HPI:  [name] is a 78 y.o. female with a past medial history significant for chest pain and strong family history of heart disease in both parents as well as hypertension.  [name] was recently seen in the hospital for chest pain.  She says that she was at [facility] and suddenly had substernal chest pain which was crushing in nature associated with shortness of breath.  She only got a few items and left because she could not tolerate it.  She went home to lay down but the symptoms did not improve much.  Eventually it did slowly improve but she presented to the emergency department later.  Workup however was unremarkable with troponins of 7 and 8 which are negative.  EKG which was personally reviewed showed sinus rhythm with poor R wave progression anteriorly, possibly anterior infarct.  An echocardiogram was ordered, however showed no regional wall motion abnormalities with LVEF 65 to 70% and grade 1 diastolic dysfunction.  RVSP was normal and there was no significant valve disease.  Eventually her chest pain dissipated.  Based on these findings it was felt that she was safe for outpatient cardiac evaluation.  She returns today for follow-up and says that she has not had any more chest pain symptoms however she says she still feels unwell ever since this episode.  She was eventually diagnosed with a UTI and treated for that but that does not note any improvement in her symptoms otherwise.  Blood pressure was elevated today 161/76.  PMHx:  Past Medical History:  Diagnosis Date   Vitamin D  deficiency     Past Surgical History:  Procedure Laterality Date   ABDOMINAL HYSTERECTOMY  1981   partial   APPENDECTOMY     BACK SURGERY     CESAREAN SECTION     SALPINGOOPHORECTOMY  1981 left   1982 right    FAMHx:  Family History  Problem Relation Age of Onset   Diabetes Mother     Kidney disease Mother    Heart disease Mother    Hypertension Father    Heart disease Father    Heart disease Sister    Breast cancer Neg Hx     SOCHx:   reports that she quit smoking about 15 years ago. Her smoking use included cigarettes. She started smoking about 61 years ago. She has a 11.5 pack-year smoking history. She has never used smokeless tobacco. She reports that she does not drink alcohol and does not use drugs.  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  HOME MEDS: Current Outpatient Medications on File Prior to Visit  Medication Sig Dispense Refill   Acetaminophen  (TYLENOL  EXTRA STRENGTH PO) Take by mouth. Takes 2 at night     aspirin  81 MG tablet Take 81 mg by mouth daily.     Calcium  Carbonate (CALCIUM  500 PO) Take 1 tablet by mouth daily.     Cholecalciferol (VITAMIN D  PO) Take 2,000 Int'l Units by mouth 2 (two) times daily.      ezetimibe  (ZETIA ) 10 MG tablet Take  1 tablet      Daily       for Cholesterol 90 tablet 0   famotidine  (PEPCID ) 20 MG tablet Take 1 tablet (20 mg total) by mouth daily. 90 tablet 1   fenofibrate  (TRICOR ) 145 MG tablet Take 1 tablet Daily for Triglycerides (Blood Fats ) 90 tablet 0   furosemide  (LASIX ) 40 MG tablet TAKE 1 TABLET BY MOUTH TWICE A DAY FOR BLOOD PRESSURE AND FLUID (Patient taking differently: Take 40 mg by mouth daily.) 180 tablet 1   Ginkgo Biloba 30 MG CAPS as directed Orally     levothyroxine  (SYNTHROID ) 88 MCG tablet 1/2 tab 3 x / week  MWF &1 tablet 4 x / week TTHSS & Take on an empty stomach with only water for 30 minutes & no Antacid meds, Calcium  or Magnesium for 4 hours & avoid Biotin (Patient taking differently: Take 88 mcg by mouth daily  before breakfast.) 63 tablet 3   Magnesium 500 MG TABS Take 1 tablet by mouth daily.     Multiple Vitamins-Minerals (MULTIVITAMIN WITH MINERALS) tablet Take 1 tablet by mouth daily.     omeprazole  (PRILOSEC) 40 MG capsule Take 1 capsule (40 mg total) by mouth 2 (two) times daily. 60 capsule 0   vitamin C (ASCORBIC ACID) 500 MG tablet Take 1,000 mg by mouth daily. Takes 2,000mg      gabapentin  (NEURONTIN ) 300 MG capsule Take 1 capsule (300 mg total) by mouth at bedtime. (Patient not taking: Reported on 06/30/2023) 90 capsule 0   No current facility-administered medications on file prior to visit.    LABS/IMAGING: No results found for this or any previous visit (from the past 48 hours). No results found.  LIPID PANEL:    Component Value Date/Time   CHOL 185 01/15/2023 1011   TRIG 237 (H) 01/15/2023 1011   HDL 42 (L) 01/15/2023 1011   CHOLHDL 4.4 01/15/2023 1011   VLDL 36 (H) 06/11/2016 1617   LDLCALC 107 (H) 01/15/2023 1011     WEIGHTS: Wt Readings from Last 3 Encounters:  06/30/23 164 lb (74.4 kg)  06/10/23 172 lb 6.4 oz (78.2 kg)  01/15/23 172 lb 6.4 oz (78.2 kg)    VITALS: BP (!) 161/76 (BP Location: Left Arm, Patient Position: Sitting, Cuff Size: Normal)   Pulse 76   Ht 5' 1 (1.549 m)   Wt 164 lb (74.4 kg)   SpO2 96%   BMI 30.99 kg/m   EXAM: General appearance: alert and no distress Lungs: clear to auscultation bilaterally Heart: regular rate and rhythm, S1, S2 normal, no murmur, click, rub or gallop Extremities: extremities normal, atraumatic, no cyanosis or edema Neurologic: Grossly normal  EKG: Deferred  ASSESSMENT: Chest pain with cardiovascular risk factors, negative troponins Recent normal echo with LVEF 65 to 70% Elevated blood pressure Dyslipidemia  PLAN: 1.   [name] was recently seen in the emergency department for significant chest pain although workup did not reveal any myocardial damage with negative troponins and an echocardiogram that was  reassuring with normal function and no regional wall motion abnormalities.  She still continues to feel unwell with fatigue and symptoms that did not seem normal to her.  She was found to have a UTI but this was treated and those symptoms did not really improve although her urine did clear up.  The could still be underlying cardiovascular disease even though her workup in the hospital was negative.  I would advise a CT coronary angiogram to further evaluate her coronaries.  If this is reassuring then would consider additional workup through her primary care provider.  Thanks again for the kind referral.  Vinie KYM Maxcy, MD, [facility], FNLA, FACP  [place]  [facility]  Medical Director of the [facility]  [facility] Reduction [facility] Diplomate of the American Board of Clinical Lipidology Attending Cardiologist  Direct Dial: [phone]  Fax: [phone]  Website:  [email or web address] Vinie JAYSON Maxcy 07/01/2023, 12:59 PM

## 2023-07-06 ENCOUNTER — Ambulatory Visit: Payer: PPO | Admitting: Nurse Practitioner

## 2023-07-20 ENCOUNTER — Encounter (HOSPITAL_COMMUNITY): Payer: Self-pay

## 2023-07-21 ENCOUNTER — Telehealth (HOSPITAL_COMMUNITY): Payer: Self-pay | Admitting: Emergency Medicine

## 2023-07-21 ENCOUNTER — Telehealth (HOSPITAL_COMMUNITY): Payer: Self-pay | Admitting: *Deleted

## 2023-07-21 NOTE — Telephone Encounter (Signed)
 Attempted to call patient regarding upcoming cardiac CT appointment. Left message on voicemail with name and callback number Rockwell Alexandria RN Navigator Cardiac Imaging [facility] [phone] Office [phone] Cell

## 2023-07-21 NOTE — Telephone Encounter (Signed)
 Patient returning call about her upcoming cardiac imaging study; pt verbalizes understanding of appt date/time, parking situation and where to check in, pre-test NPO status and medications ordered, and verified current allergies; name and call back number provided for further questions should they arise  Larey Brick RN Navigator Cardiac Imaging [facility] [phone] office [phone] cell  Patient to take 50mg  metoprolol tartrate two hours prior to her cardiac CT scan.

## 2023-07-22 ENCOUNTER — Ambulatory Visit (HOSPITAL_COMMUNITY)
Admission: RE | Admit: 2023-07-22 | Discharge: 2023-07-22 | Disposition: A | Discharge status: Home / Self Care | Source: Ambulatory Visit | Attending: Internal Medicine | Admitting: Internal Medicine

## 2023-07-22 DIAGNOSIS — I700 Atherosclerosis of aorta: Secondary | ICD-10-CM | POA: Diagnosis not present

## 2023-07-22 DIAGNOSIS — I2510 Atherosclerotic heart disease of native coronary artery without angina pectoris: Secondary | ICD-10-CM | POA: Insufficient documentation

## 2023-07-22 DIAGNOSIS — R079 Chest pain, unspecified: Secondary | ICD-10-CM | POA: Insufficient documentation

## 2023-07-22 MED ORDER — IOHEXOL 350 MG/ML SOLN
100.0000 mL | Freq: Once | INTRAVENOUS | Status: AC | PRN
  Administered 2023-07-22: 100 mL via INTRAVENOUS

## 2023-07-22 MED ORDER — NITROGLYCERIN 0.4 MG SL SUBL
0.8000 mg | SUBLINGUAL_TABLET | Freq: Once | SUBLINGUAL | Status: AC
  Administered 2023-07-22: 0.8 mg via SUBLINGUAL

## 2023-07-24 ENCOUNTER — Ambulatory Visit: Payer: Self-pay | Admitting: Internal Medicine

## 2023-08-31 ENCOUNTER — Ambulatory Visit: Payer: PPO | Admitting: Nurse Practitioner

## 2023-10-05 ENCOUNTER — Other Ambulatory Visit: Payer: Self-pay | Admitting: Family

## 2023-10-05 DIAGNOSIS — E782 Mixed hyperlipidemia: Secondary | ICD-10-CM

## 2023-10-13 DIAGNOSIS — E039 Hypothyroidism, unspecified: Secondary | ICD-10-CM | POA: Diagnosis not present

## 2023-10-13 DIAGNOSIS — G2581 Restless legs syndrome: Secondary | ICD-10-CM | POA: Diagnosis not present

## 2023-10-13 DIAGNOSIS — J4520 Mild intermittent asthma, uncomplicated: Secondary | ICD-10-CM | POA: Diagnosis not present

## 2023-10-13 DIAGNOSIS — I2510 Atherosclerotic heart disease of native coronary artery without angina pectoris: Secondary | ICD-10-CM | POA: Diagnosis not present

## 2023-10-13 DIAGNOSIS — E785 Hyperlipidemia, unspecified: Secondary | ICD-10-CM | POA: Diagnosis not present

## 2023-10-13 DIAGNOSIS — Z23 Encounter for immunization: Secondary | ICD-10-CM | POA: Diagnosis not present

## 2023-12-21 ENCOUNTER — Other Ambulatory Visit

## 2023-12-22 ENCOUNTER — Ambulatory Visit (HOSPITAL_BASED_OUTPATIENT_CLINIC_OR_DEPARTMENT_OTHER)
Admission: RE | Admit: 2023-12-22 | Discharge: 2023-12-22 | Disposition: A | Source: Ambulatory Visit | Attending: Family Medicine

## 2023-12-22 DIAGNOSIS — Z13820 Encounter for screening for osteoporosis: Secondary | ICD-10-CM | POA: Insufficient documentation

## 2023-12-22 DIAGNOSIS — M8589 Other specified disorders of bone density and structure, multiple sites: Secondary | ICD-10-CM | POA: Insufficient documentation
